# Patient Record
Sex: Female | Born: 1940 | Race: White | Hispanic: No | Smoking: Never smoker
Health system: Southern US, Community
[De-identification: ages and names within clinical notes are randomized; demographics above are authoritative.]

## PROBLEM LIST (undated history)

## (undated) DIAGNOSIS — N184 Chronic kidney disease, stage 4 (severe): Secondary | ICD-10-CM

## (undated) DIAGNOSIS — R7303 Prediabetes: Secondary | ICD-10-CM

## (undated) DIAGNOSIS — I10 Essential (primary) hypertension: Secondary | ICD-10-CM

## (undated) DIAGNOSIS — M1990 Unspecified osteoarthritis, unspecified site: Secondary | ICD-10-CM

## (undated) DIAGNOSIS — D649 Anemia, unspecified: Secondary | ICD-10-CM

## (undated) DIAGNOSIS — N289 Disorder of kidney and ureter, unspecified: Secondary | ICD-10-CM

## (undated) HISTORY — PX: PARATHYROIDECTOMY: SHX19

## (undated) HISTORY — PX: DILATION AND CURETTAGE OF UTERUS: SHX78

---

## 2003-06-17 ENCOUNTER — Other Ambulatory Visit: Payer: Self-pay

## 2003-07-03 HISTORY — PX: REPLACEMENT TOTAL KNEE: SUR1224

## 2004-08-19 ENCOUNTER — Ambulatory Visit: Payer: Self-pay | Admitting: Family Medicine

## 2007-07-19 ENCOUNTER — Ambulatory Visit: Payer: Self-pay

## 2008-04-15 ENCOUNTER — Ambulatory Visit: Payer: Self-pay | Admitting: Nephrology

## 2008-04-15 IMAGING — CR DG CHEST 2V
1 series · 3 of 3 positions shown · non-contrast
Comparison: none

REASON FOR EXAM: Proteimina, lymphadenopathy
COMMENTS:

[Series 1: view not recorded · 0.17mm/px · 3 of 3 slices shown]
[im 1/3]
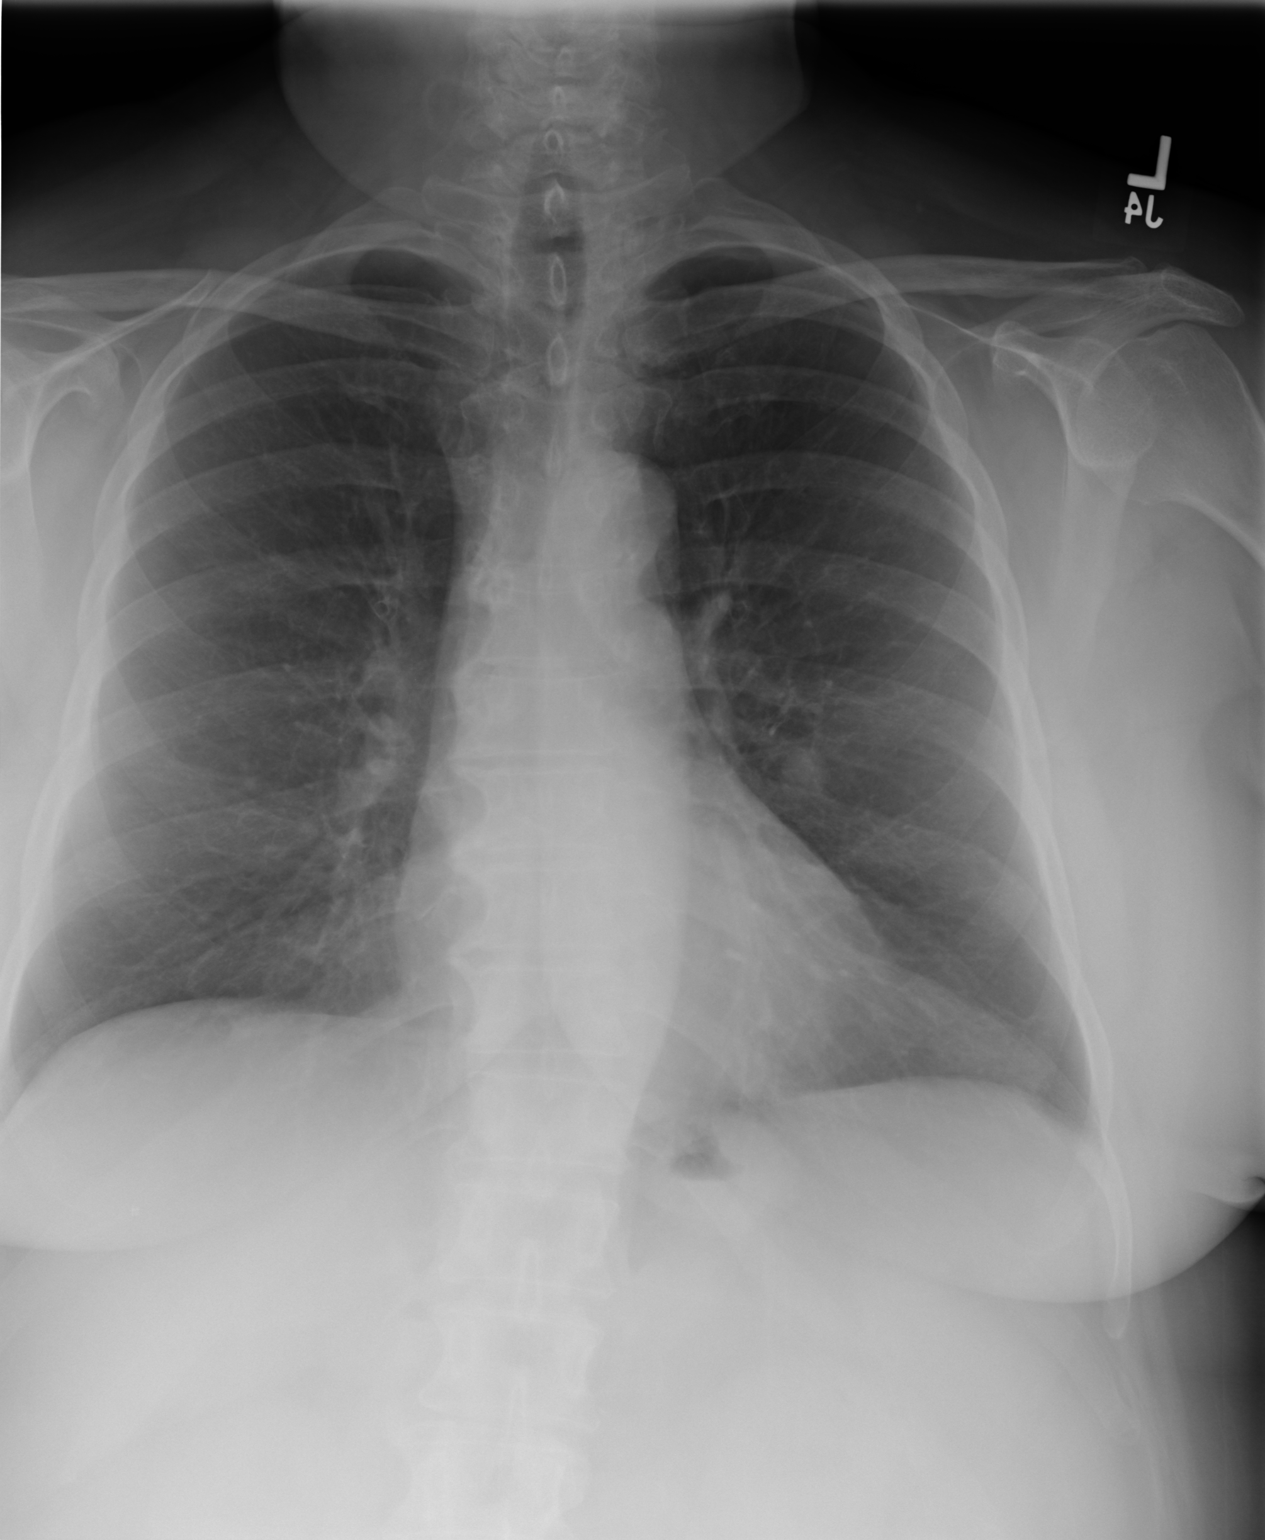
[im 2/3]
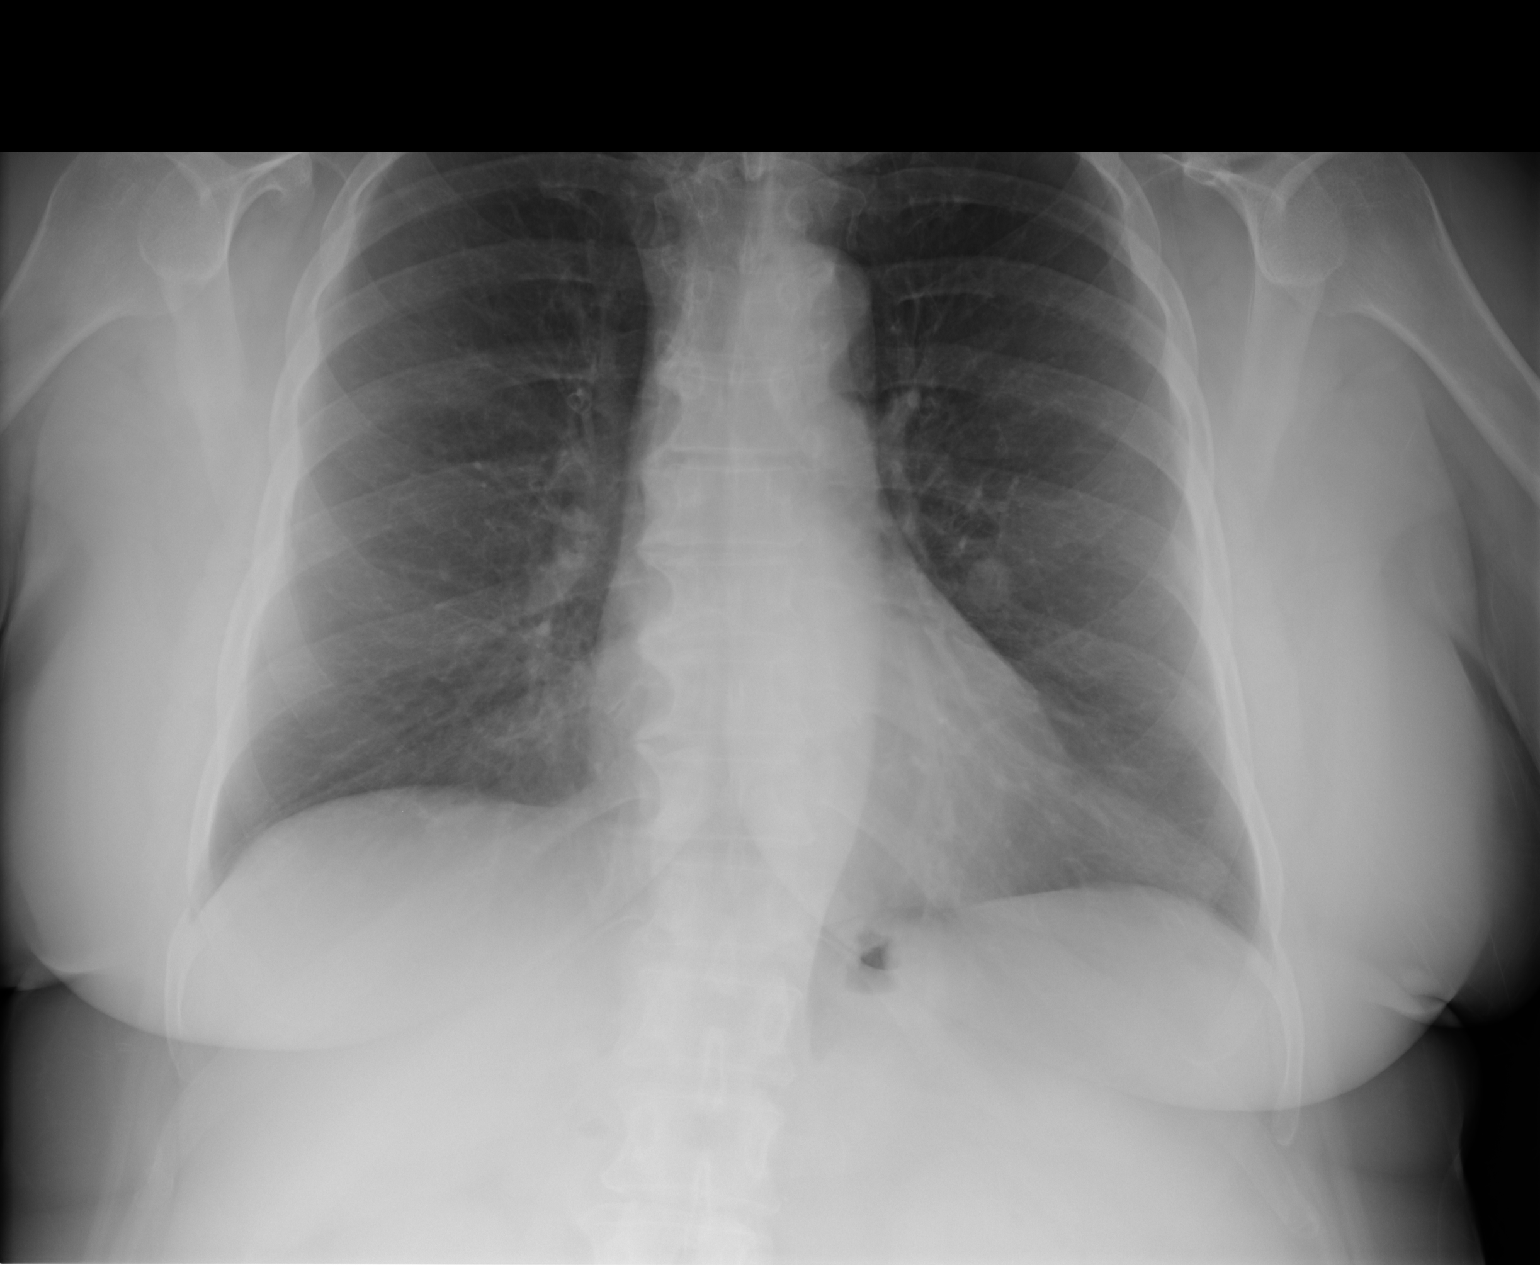
[im 3/3]
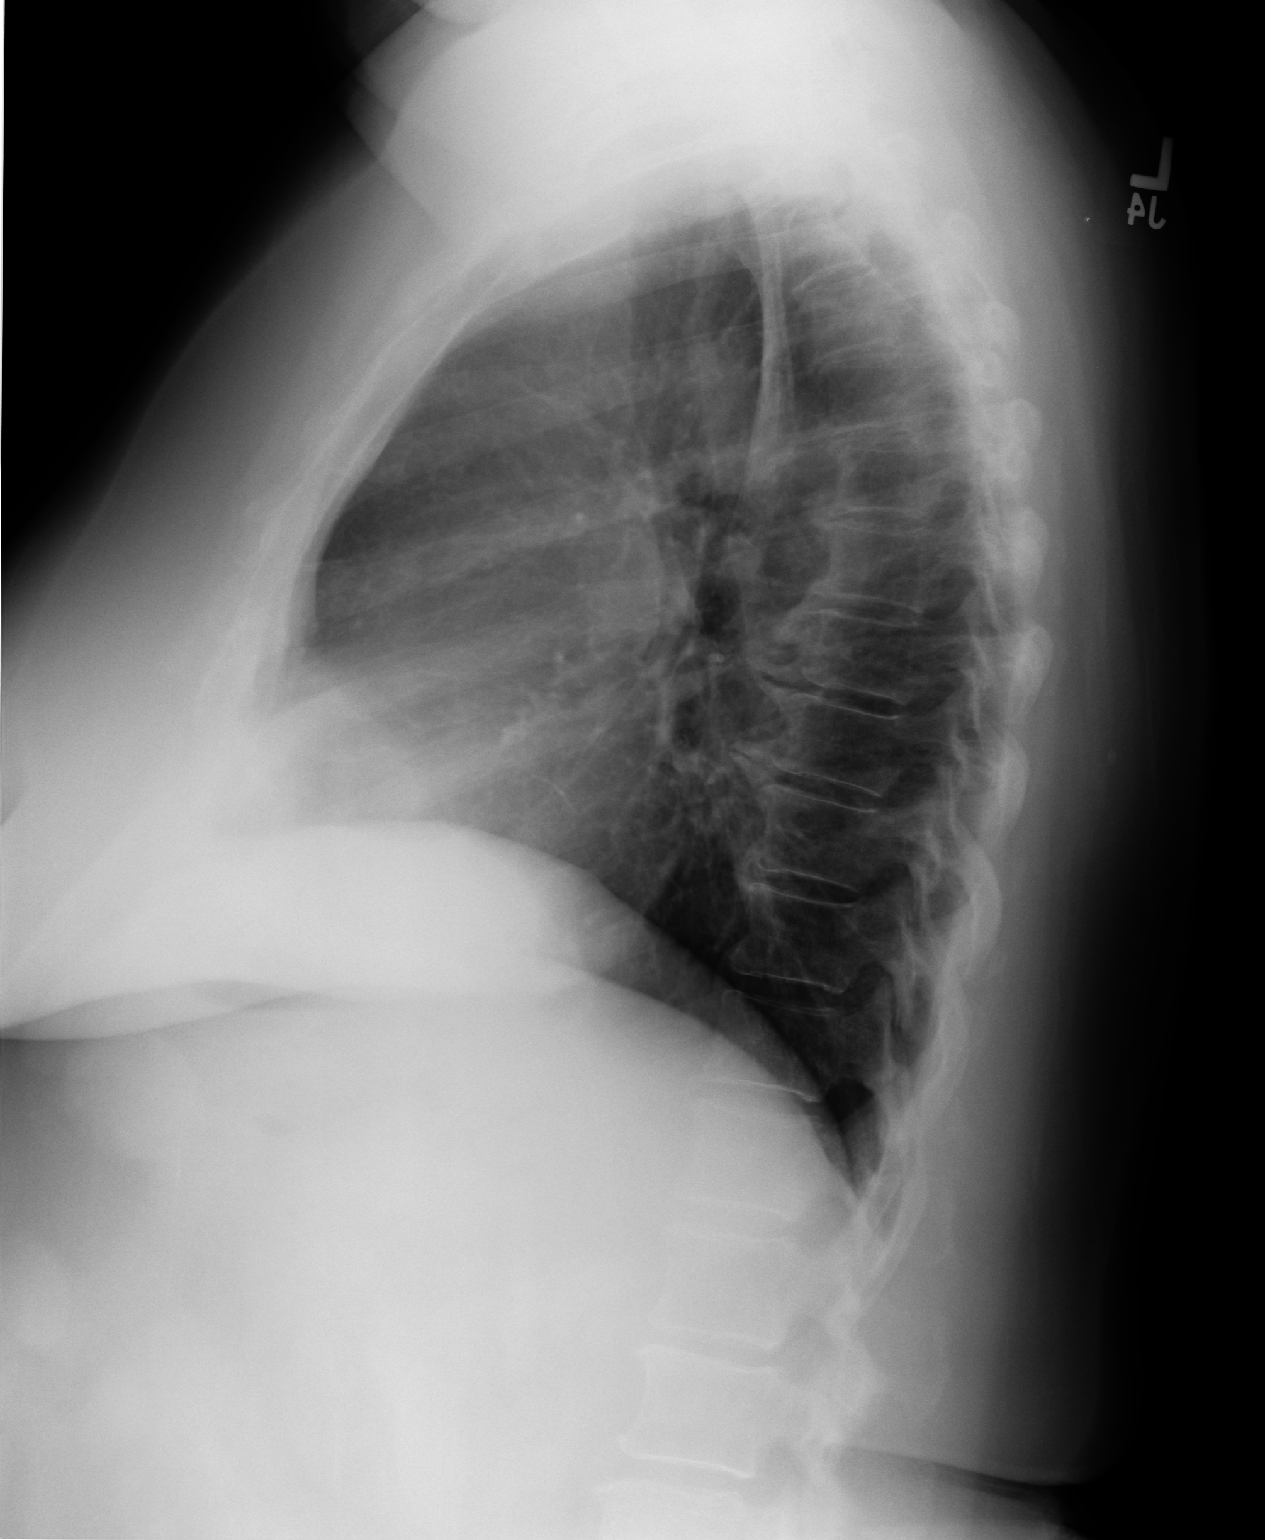

[3 of 3 positions shown; findings below may reference images not displayed]

PROCEDURE:     DXR - DXR CHEST PA (OR AP) AND LATERAL  - [DATE] [DATE]

RESULT:     There are no prior radiographs available for comparison. The
current exam shows a faint nodular density projected over the fourth LEFT
anterior rib. This is seen only in the PA view. Chest CT is recommended for
further evaluation. The lung fields otherwise are clear. Heart size is
normal. There is degenerative spurring at multiple levels of the thoracic
spine.
IMPRESSION: 1. Possible pulmonary nodule on the LEFT. Chest CT is recommended for
further evaluation.
2. The lung fields otherwise are clear.
3. Heart size is normal.

## 2008-04-16 ENCOUNTER — Ambulatory Visit: Payer: Self-pay | Admitting: Nephrology

## 2008-04-16 IMAGING — CT CT CHEST W/O CM
1 series · 15 of 33 positions shown, 19 images · non-contrast
Comparison: none

REASON FOR EXAM: PULM NODULES  CKD
COMMENTS:

[Series 2: soft tissue · axial · 0.69mm/px · z∈[+352,+618]mm · 15 of 63 slices shown, 19 images]
[im 5/63  mediastinal]
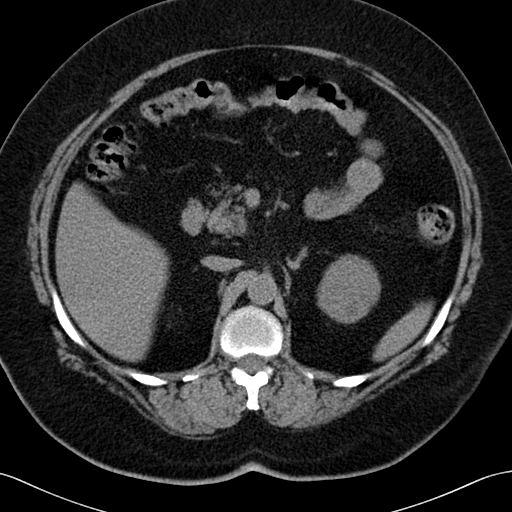
[im 5/63  lung]
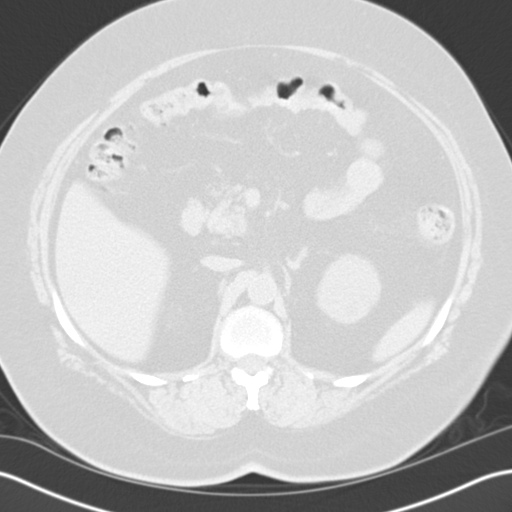
[im 10/63  lung]
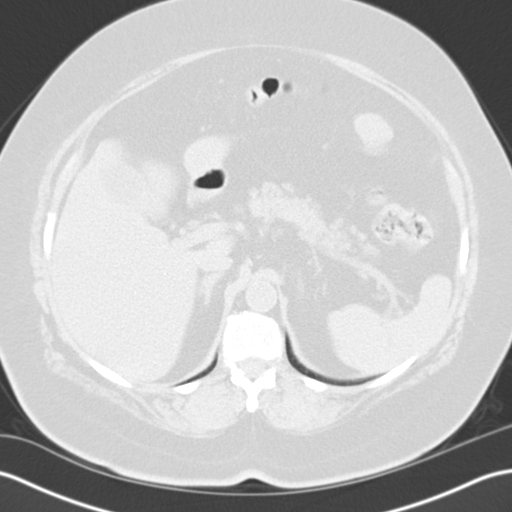
[im 13/63  lung]
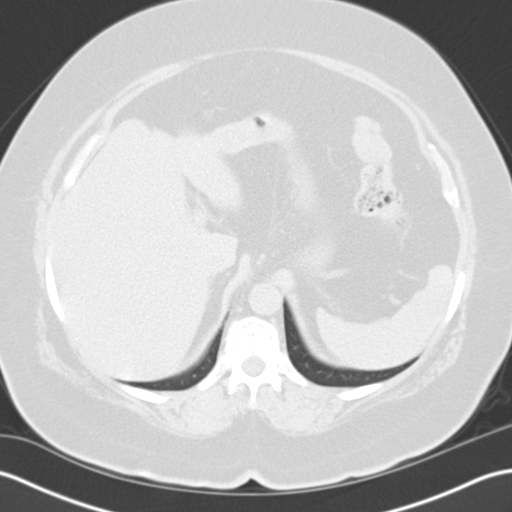
[im 17/63  lung]
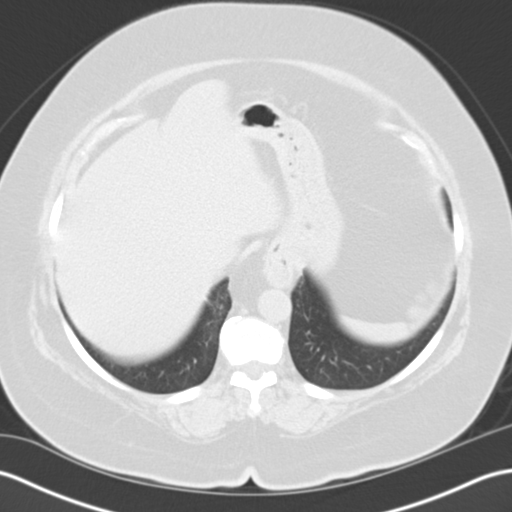
[im 21/63  mediastinal]
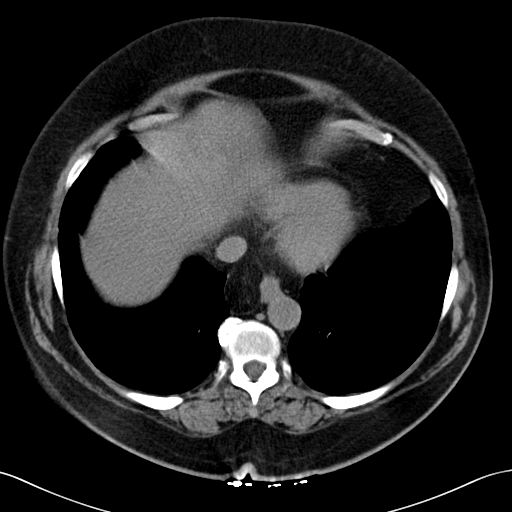
[im 21/63  lung]
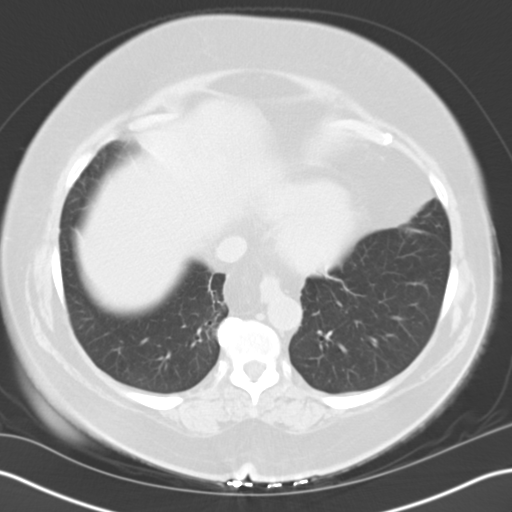
[im 25/63  lung]
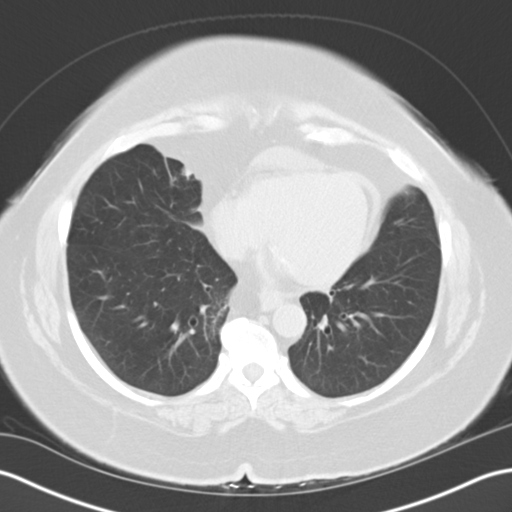
[im 28/63  lung]
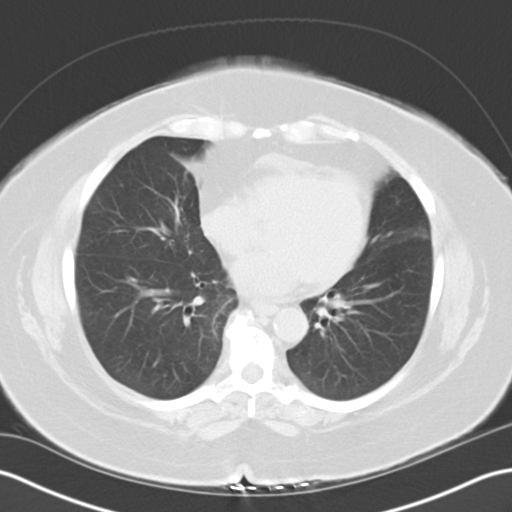
[im 33/63  lung]
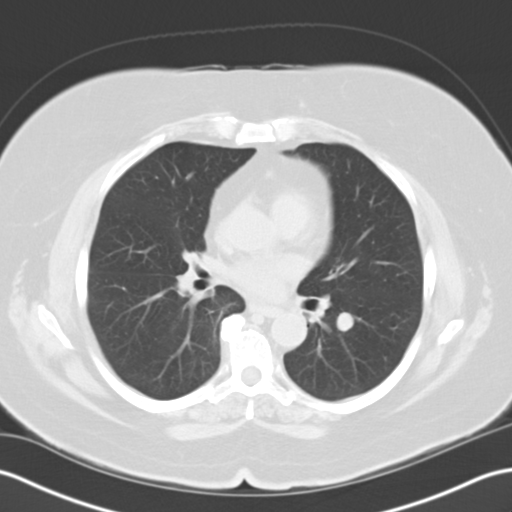
[im 35/63  mediastinal]
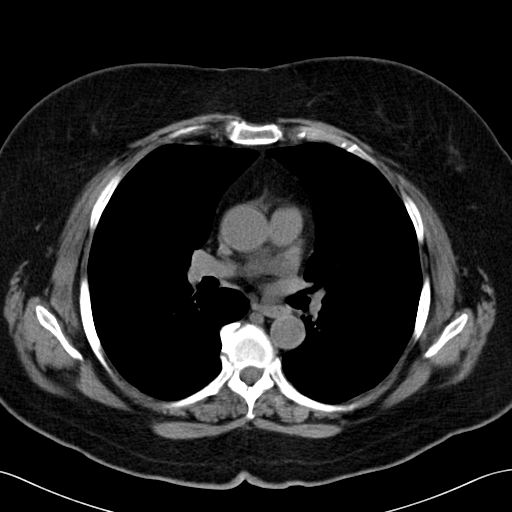
[im 35/63  lung]
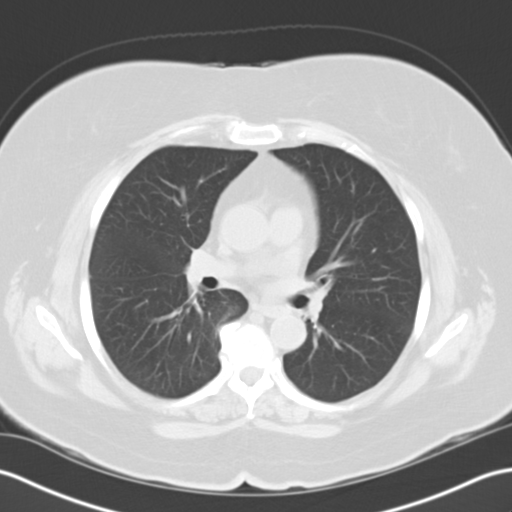
[im 38/63  lung]
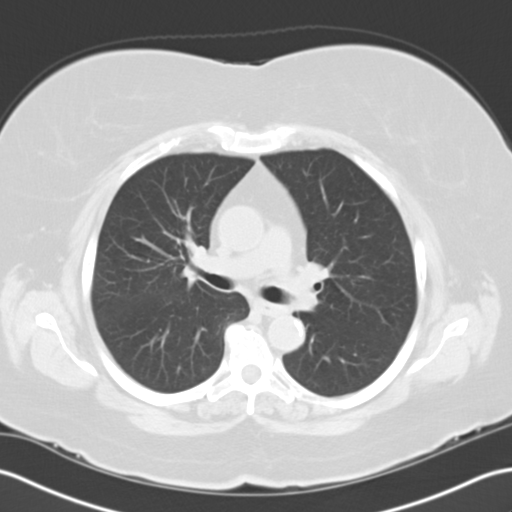
[im 42/63  lung]
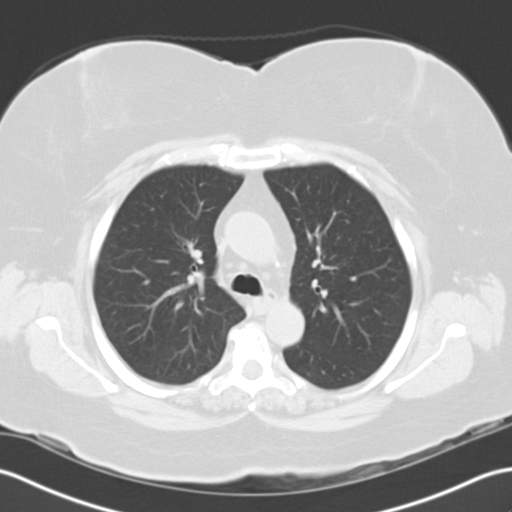
[im 46/63  lung]
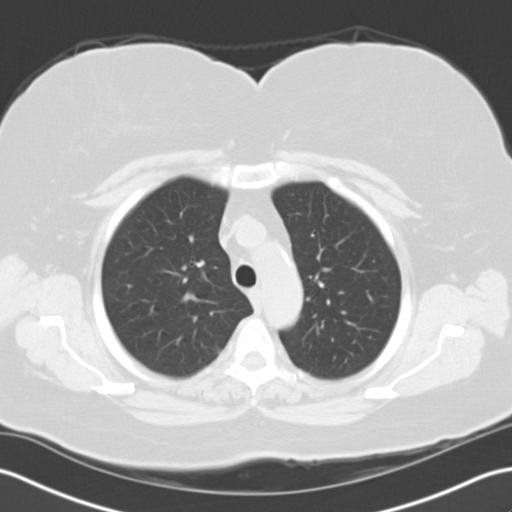
[im 50/63  mediastinal]
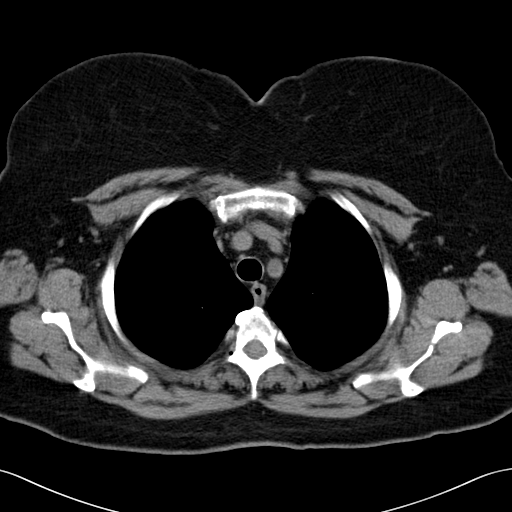
[im 50/63  lung]
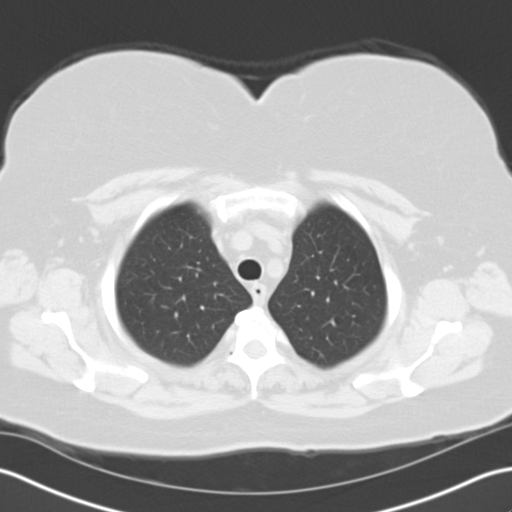
[im 53/63  lung]
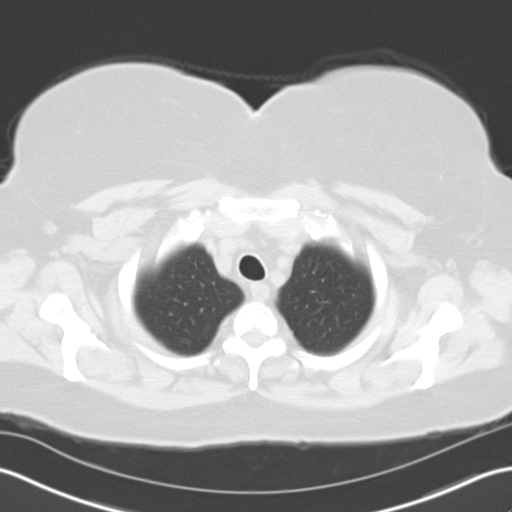
[im 58/63  lung]
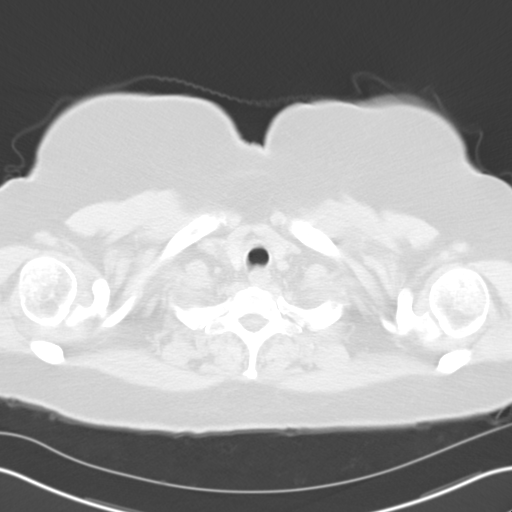

[15 of 33 positions shown; findings below may reference images not displayed]

PROCEDURE:     CT  - CT CHEST WITHOUT CONTRAST  - [DATE]  [DATE]

RESULT:     Noncontrast CT of the chest is compared to the previous chest
x-ray of [DATE].

There is a smoothly rounded noncalcified soft tissue nodule in the LEFT
lower lobe measuring 8.6 mm transversely and approximately 10 mm anterior to
posterior. A very subtle nodular density is seen at lung window settings on
image 46 posteriorly in the LEFT lower lobe approximately 7 mm deep to the
pleura. Long axis measurement of this is approximately 3 mm. There is
minimal linear atelectasis or fibrosis in the lung bases. No additional
pulmonary nodules are evident. There is no edema. There is no pleural
effusion. There is minimal pericardial thickening or pericardial effusion
along the anterior pericardial region showing an anterior to posterior
dimension measuring up to 1.4 cm in thickness on image 39. There is no
evidence of cardiomegaly. There is a small sliding-type hiatal hernia. The
adrenal glands are unremarkable. There appears to be a LEFT renal cyst
present. The other upper abdominal viscera included are unremarkable. No
definite mediastinal, hilar mass or adenopathy is present.
IMPRESSION: 1. Smoothly rounded nodular density in the LEFT lower lobe. This may
represent a noncalcified granuloma. Malignancy is not completely excluded.
PET/CT could be considered but granulomas can give positive findings on PET.
Short term surveillance CT could be performed in approximately 3 months to
document interval change in size. An old chest x-ray from [D9] is being
obtained to compare to the most recent exam. There is a 3 mm nodular density
in the LEFT lower lobe which is nonspecific and also can be followed on
subsequent exams. There is no mediastinal, hilar mass or adenopathy.
2. Possible small amount of pericardial effusion or pericardial thickening
as described.
3. Small hiatal hernia.

## 2008-07-17 ENCOUNTER — Ambulatory Visit: Payer: Self-pay | Admitting: Nephrology

## 2008-07-17 IMAGING — CT CT CHEST W/O CM
1 of 2 series · 14 of 32 positions shown, 18 images · non-contrast
Comparison: none

REASON FOR EXAM: lung nodule interval change
COMMENTS:

PROCEDURE:     CT  - CT CHEST WITHOUT CONTRAST  - [DATE]  [DATE]
RESULT:     Comparison: [DATE]
INDICATION: Left pulmonary nodule
TECHNIQUE: Multiple axial images of the chest are obtained without
intravenous contrast.

[Series 3: lung windows · axial · 0.66mm/px · z∈[+729,+959]mm · 14 of 56 slices shown, 18 images]
[im 5/56  mediastinal]
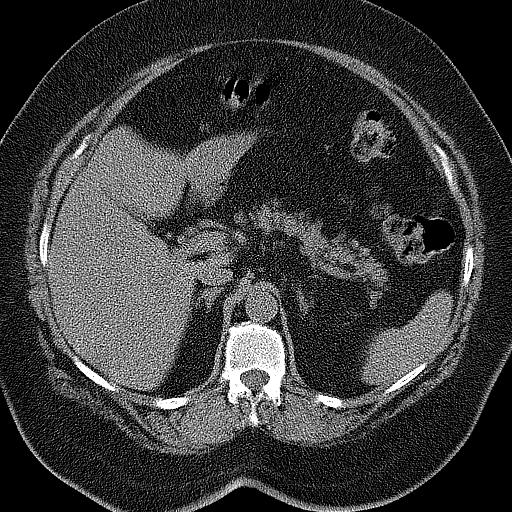
[im 5/56  lung]
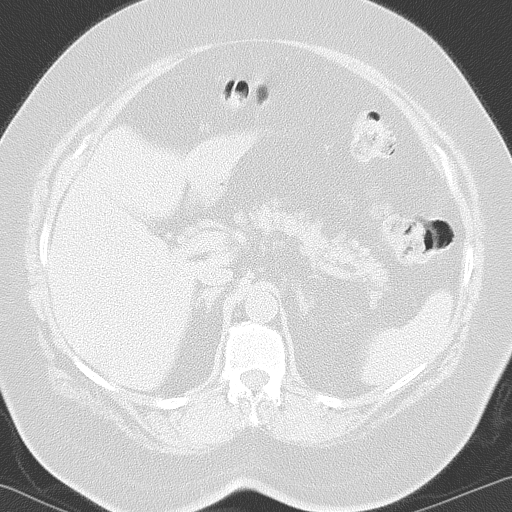
[im 9/56  lung]
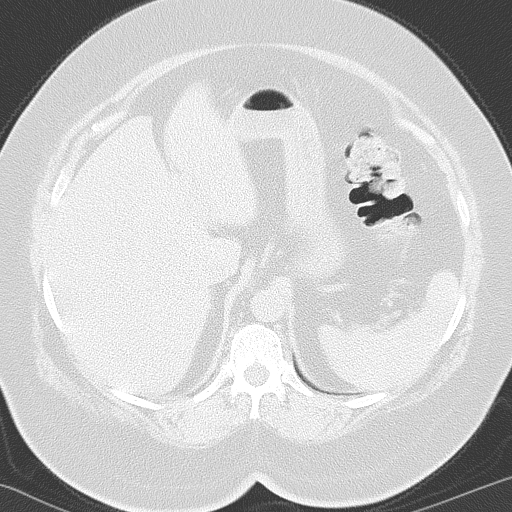
[im 13/56  lung]
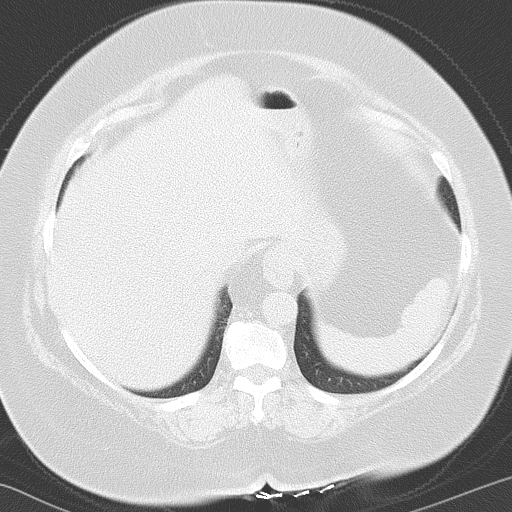
[im 17/56  lung]
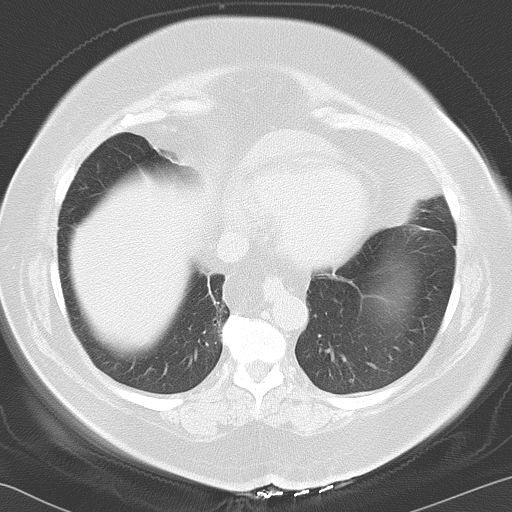
[im 22/56  mediastinal]
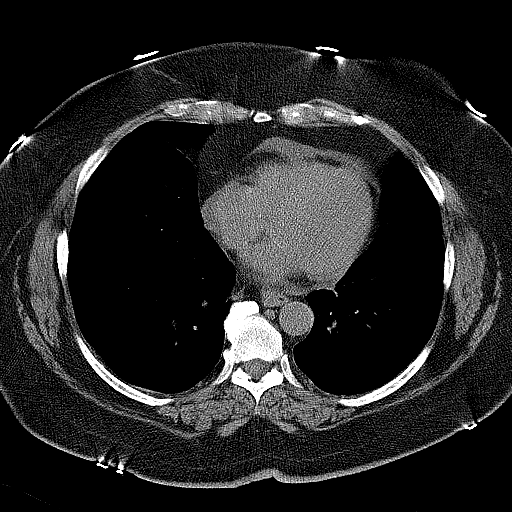
[im 22/56  lung]
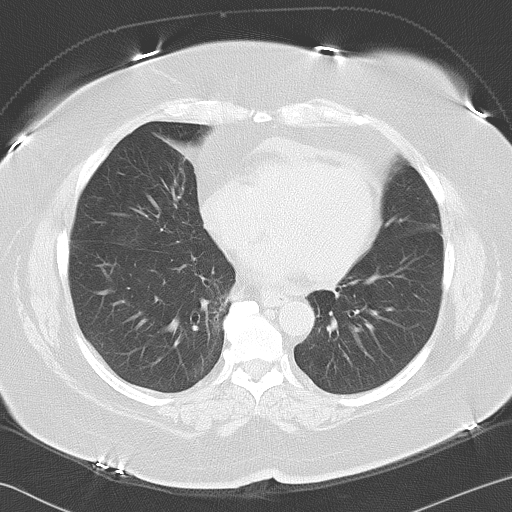
[im 25/56  lung]
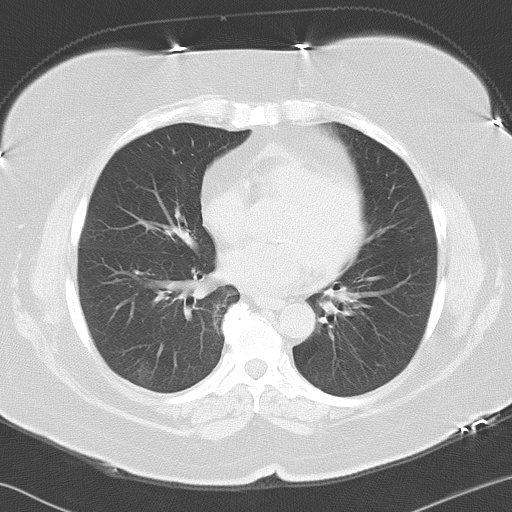
[im 26/56  lung]
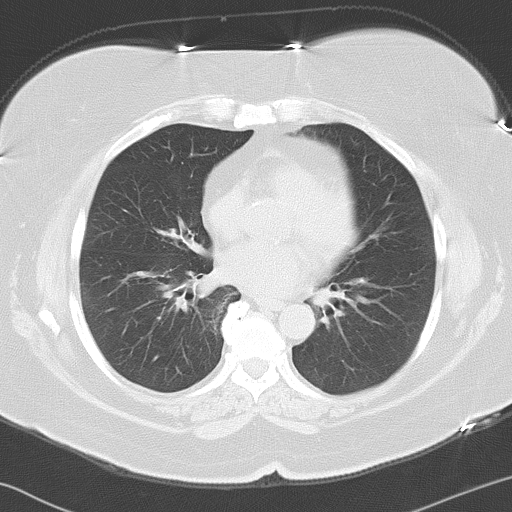
[im 28/56  lung]
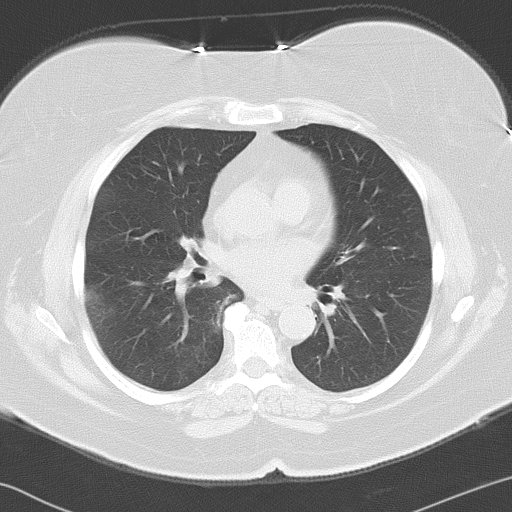
[im 30/56  mediastinal]
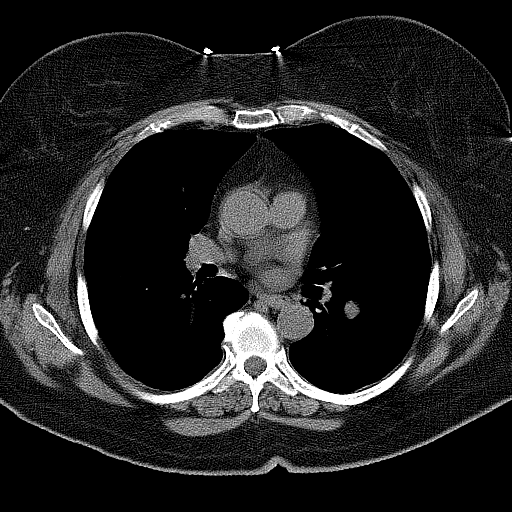
[im 30/56  lung]
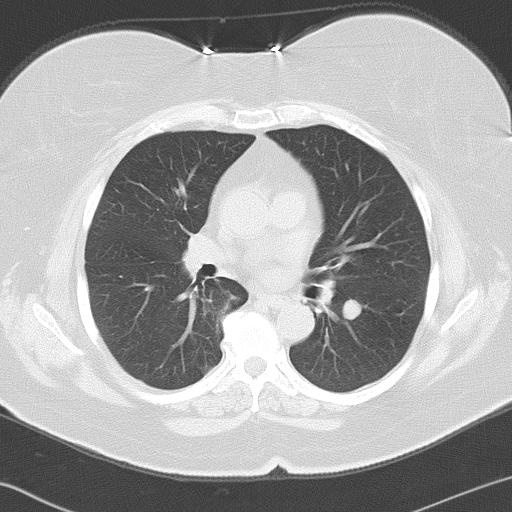
[im 34/56  lung]
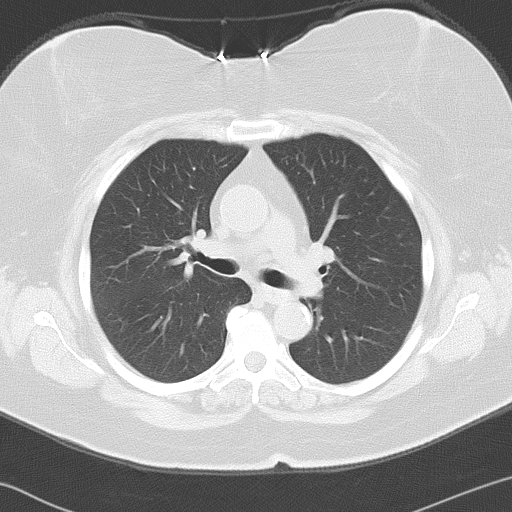
[im 39/56  lung]
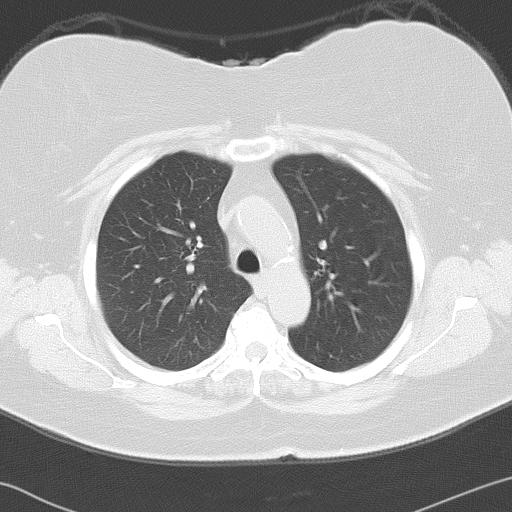
[im 43/56  lung]
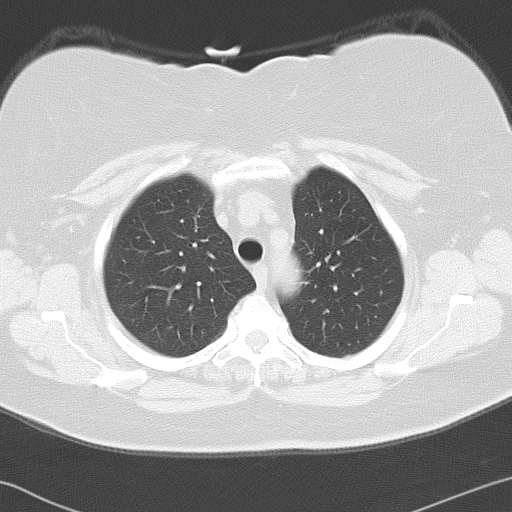
[im 47/56  mediastinal]
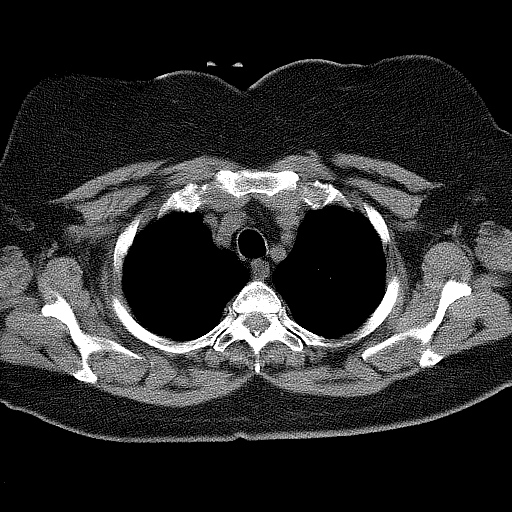
[im 47/56  lung]
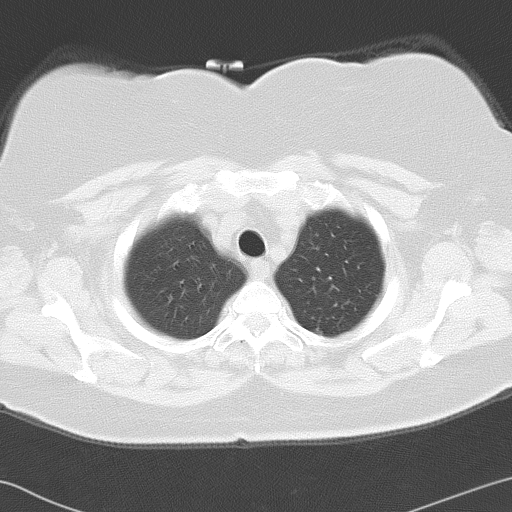
[im 51/56  lung]
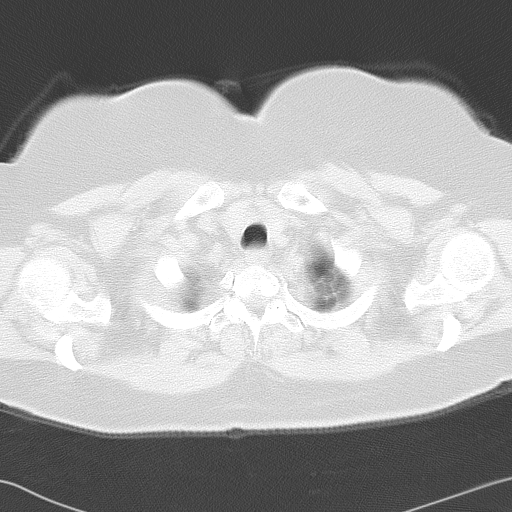

[14 of 32 positions shown; findings below may reference images not displayed]

FINDINGS: The central airways are patent. There is a well-circumscribed 14 mm
pulmonary nodule in the superior segment of the left lower lobe which is
unchanged compared with the prior examination. The lungs are otherwise
clear. There is no pleural effusion or pneumothorax.

There is no axillary or mediastinal lymphadenopathy. There is no gross hilar
lymphadenopathy although evaluation is slightly limited secondary to lack of
intravenous contrast.

The heart size is normal. There is a small pericardial effusion. The
thoracic aorta is normal in caliber.  .

Review of bone windows demonstrates no focal lytic or sclerotic lesions.

Limited noncontrast images of the upper abdomen were obtained. The adrenal
glands appear normal. The remainder of the visualized abdominal organs are
unremarkable.
IMPRESSION: Stable 14 mm pulmonary nodule in the superior segment of the left lower
lobe. Recommend followup CT of the chest without contrast in 3 months versus
a PET/CT.

## 2008-10-16 ENCOUNTER — Ambulatory Visit: Payer: Self-pay | Admitting: Nephrology

## 2008-10-16 IMAGING — CT NM PET TUM IMG LTD AREA
5 series · 24 of 25 positions shown · non-contrast
Comparison: none

REASON FOR EXAM: 3mo FU lung nodule
COMMENTS:

[Series 3: ct wb 3.0 b30f · axial · 3.0mm · 0.98mm/px · z∈[-1390,-522]mm · 10 of 435 slices shown]
[im 1/435  soft-tissue]
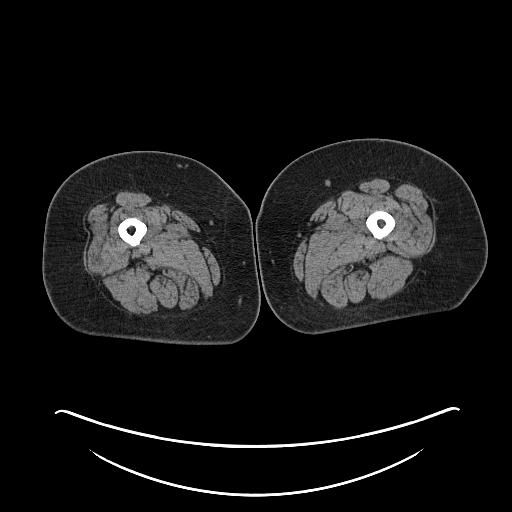
[im 49/435  soft-tissue]
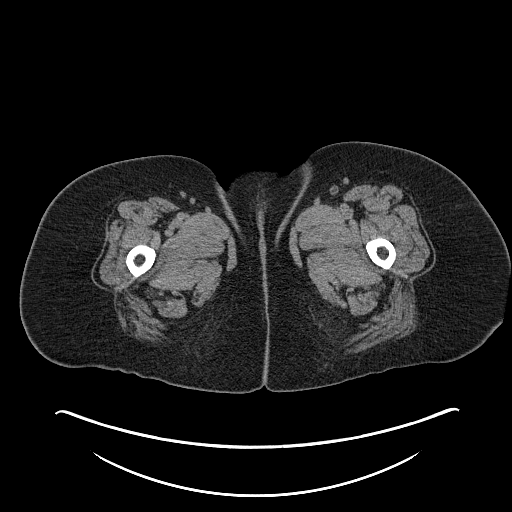
[im 97/435  soft-tissue]
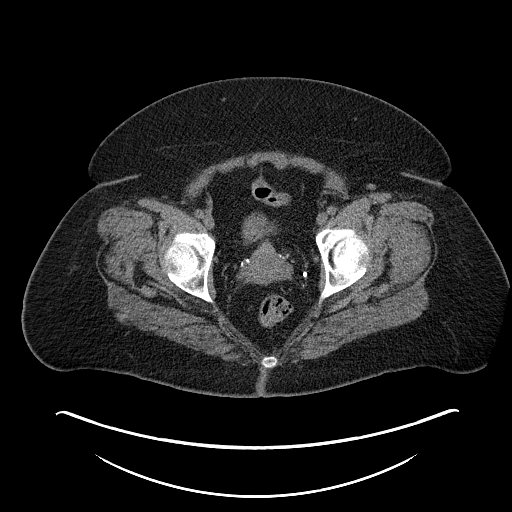
[im 145/435  soft-tissue]
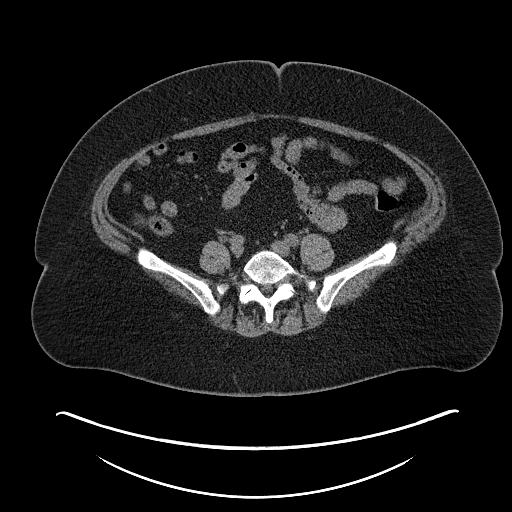
[im 193/435  soft-tissue]
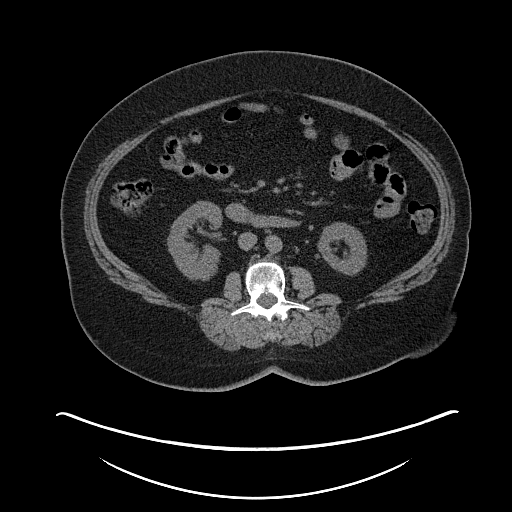
[im 242/435  soft-tissue]
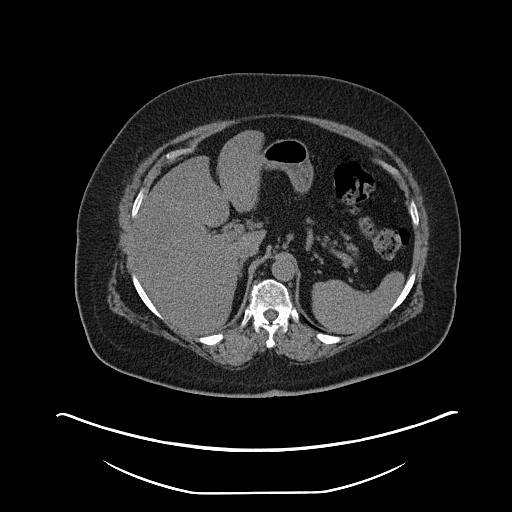
[im 290/435  soft-tissue]
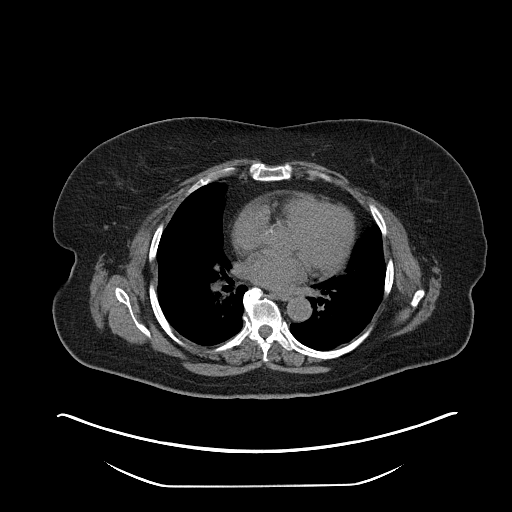
[im 338/435  soft-tissue]
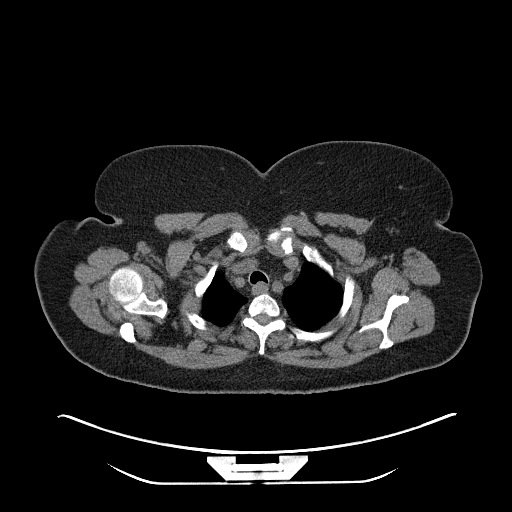
[im 386/435  soft-tissue]
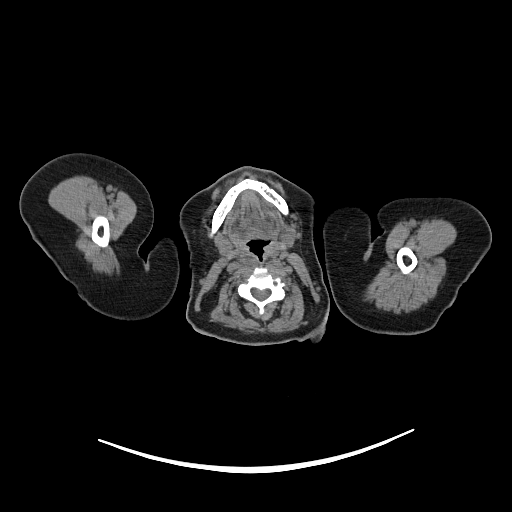
[im 435/435  soft-tissue]
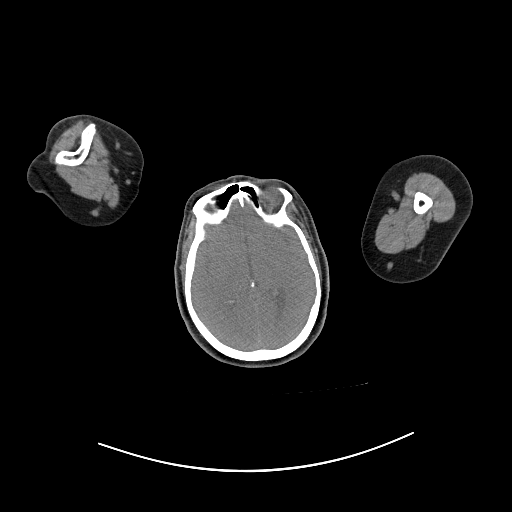

[Series 102: pet wb · axial · 5.0mm · 4.07mm/px · z∈[-1388,-522]mm · 6 of 290 slices shown]
[im 1/290]
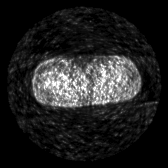
[im 49/290]
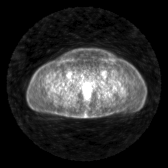
[im 145/290]
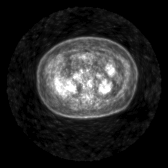
[im 193/290]
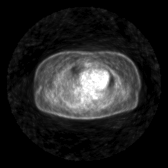
[im 241/290]
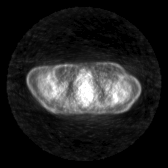
[im 290/290]
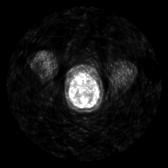

[Series 603: pet axial · 4 of 170 slices shown]
[im 1/170]
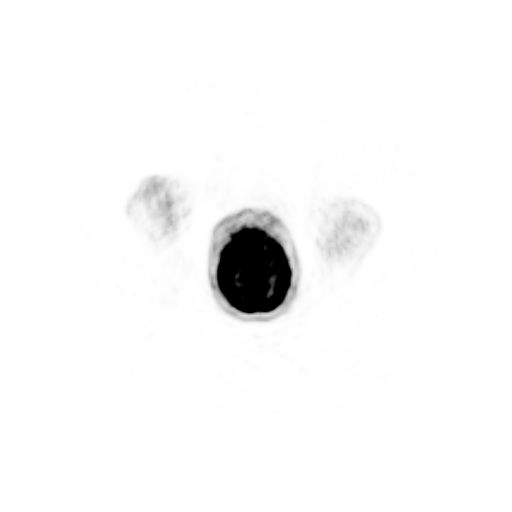
[im 57/170]
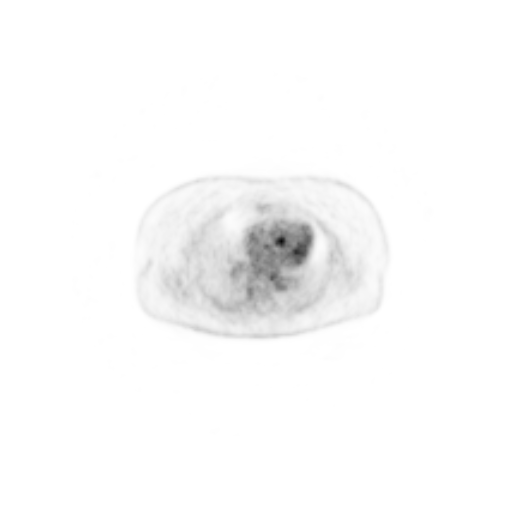
[im 113/170]
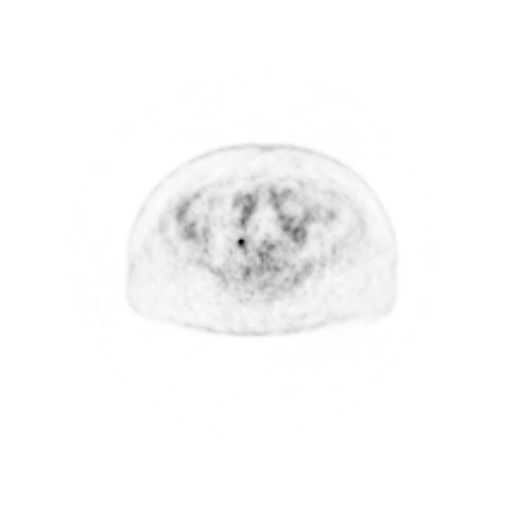
[im 170/170]
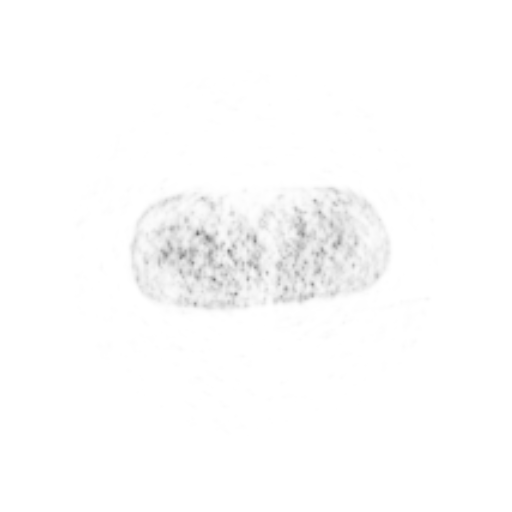

[Series 604: pet coronal · 2 of 69 slices shown]
[im 1/69]
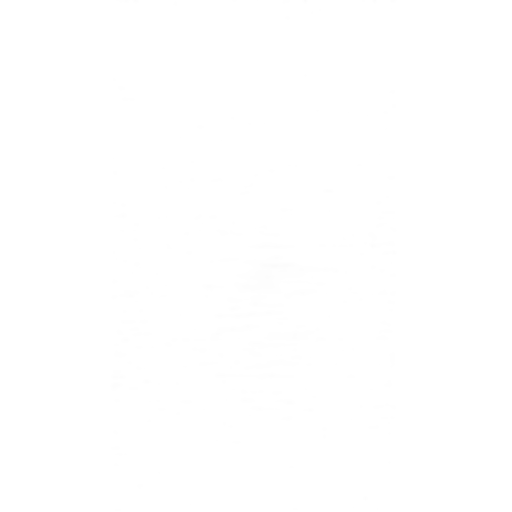
[im 69/69]
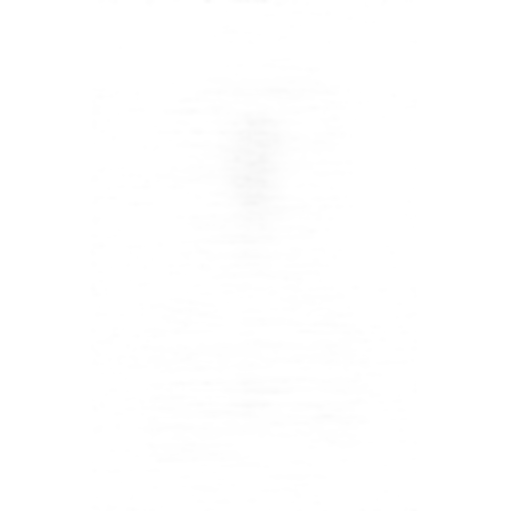

[Series 605: pet sagittal · 2 of 100 slices shown]
[im 1/100]
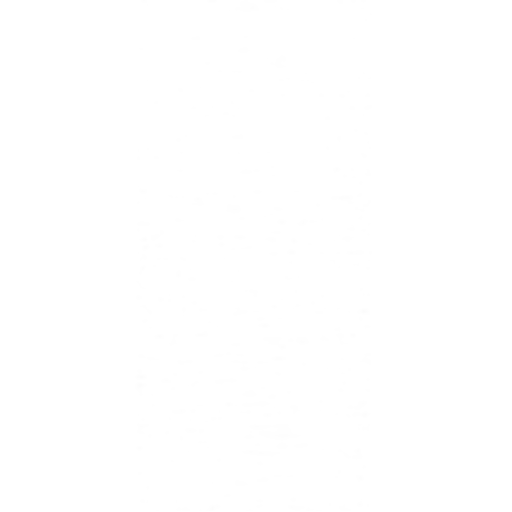
[im 100/100]
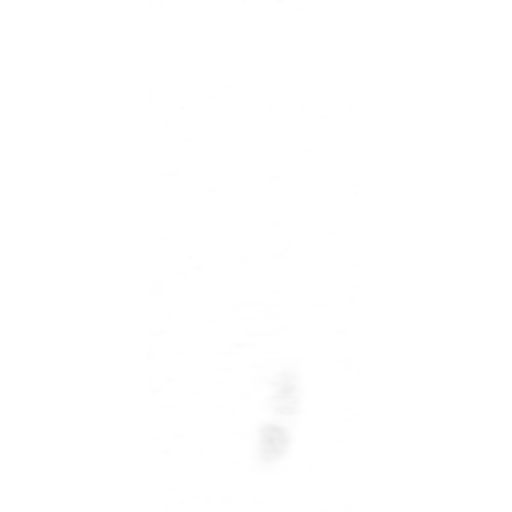

[24 of 25 positions shown; findings below may reference images not displayed]

PROCEDURE:     PET - PET/CT DX LUNG CA  - [DATE]  [DATE]

RESULT:     The patient is undergoing evaluation of an abnormal pulmonary
nodules seen on prior CT. The patient's fasting blood glucose level was 115
mg/dL. The patient received 13.63 mCi of fluorine 18 labeled
fluorodeoxyglucose at [DATE] a.m. the is the left antecubital vein. Scanning
was begun at [DATE] p.m. Delayed imaging was performed as well. A noncontrast
CT scan was performed from the base of the brain to the upper thighs for
coregistration and attenuation correction. Comparison is made to CT scan of
the chest dated a [DATE].

On today's study I do not see more than minimal uptake in the nodule in the
superior segment of the left lower lobe. The SUV value on the initial images
is only 2.1. This falls to less than one on the delayed images. I do not see
abnormal uptake elsewhere within the lungs. There is mildly increased uptake
associated with the lower esophagus but a discrete mass within the esophagus
is not seen and there is a small hiatal hernia. Within the abdomen there is
normal expected renal and bladder uptake and small amounts of bowel uptake.
I do not see findings suspicious for intra-abdominal lymphadenopathy.

I do not see abnormality within the neck. Incidental note is made of the
presence of a gallstone.
IMPRESSION: 1. I do not see significant abnormal signal in the left lower lobe nodule. I
do not see other areas of abnormal uptake within the thorax. There is mildly
increased uptake within the distal esophagus adjacent to the small hiatal
hernia that is likely physiologic.
2. In the abdomen and pelvis I do not see abnormal uptake of the
radiopharmaceutical.
3. There is a gallstone present.

It would be reasonable to consider an additional 4-6 month followup CT scan
to allow reassessment of the size of the pulmonary nodule. This would bring
the patient to approximately one year since the nodule was identified.
Consideration could be given then for performing longer term followup to
assure stability [DATE] years.

## 2008-10-16 IMAGING — CT NM PET TUM IMG LTD AREA
1 of 5 series · 1 of 25 positions shown · non-contrast
Comparison: none

REASON FOR EXAM: 3mo FU lung nodule
COMMENTS:

[Series 102: pet lung · axial · 5.0mm · 4.07mm/px · 1 of 94 slices shown]
[im 47/94]
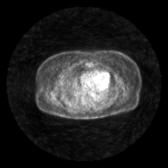

[1 of 25 positions shown; findings below may reference images not displayed]

PROCEDURE:     PET - PET/CT DX LUNG CA  - [DATE]  [DATE]

RESULT:     The patient is undergoing evaluation of an abnormal pulmonary
nodules seen on prior CT. The patient's fasting blood glucose level was 115
mg/dL. The patient received 13.63 mCi of fluorine 18 labeled
fluorodeoxyglucose at [DATE] a.m. the is the left antecubital vein. Scanning
was begun at [DATE] p.m. Delayed imaging was performed as well. A noncontrast
CT scan was performed from the base of the brain to the upper thighs for
coregistration and attenuation correction. Comparison is made to CT scan of
the chest dated a [DATE].

On today's study I do not see more than minimal uptake in the nodule in the
superior segment of the left lower lobe. The SUV value on the initial images
is only 2.1. This falls to less than one on the delayed images. I do not see
abnormal uptake elsewhere within the lungs. There is mildly increased uptake
associated with the lower esophagus but a discrete mass within the esophagus
is not seen and there is a small hiatal hernia. Within the abdomen there is
normal expected renal and bladder uptake and small amounts of bowel uptake.
I do not see findings suspicious for intra-abdominal lymphadenopathy.

I do not see abnormality within the neck. Incidental note is made of the
presence of a gallstone.
IMPRESSION: 1. I do not see significant abnormal signal in the left lower lobe nodule. I
do not see other areas of abnormal uptake within the thorax. There is mildly
increased uptake within the distal esophagus adjacent to the small hiatal
hernia that is likely physiologic.
2. In the abdomen and pelvis I do not see abnormal uptake of the
radiopharmaceutical.
3. There is a gallstone present.

It would be reasonable to consider an additional 4-6 month followup CT scan
to allow reassessment of the size of the pulmonary nodule. This would bring
the patient to approximately one year since the nodule was identified.
Consideration could be given then for performing longer term followup to
assure stability [DATE] years.

## 2009-04-21 ENCOUNTER — Ambulatory Visit: Payer: Self-pay | Admitting: Nephrology

## 2009-04-21 IMAGING — CT CT CHEST W/O CM
1 series · 15 of 33 positions shown, 19 images · non-contrast
Comparison: none

REASON FOR EXAM: lung nodule 6 month F U   renal failure
COMMENTS:

[Series 2: soft tissue · axial · 0.67mm/px · z∈[-1148,-908]mm · 15 of 58 slices shown, 19 images]
[im 5/58  mediastinal]
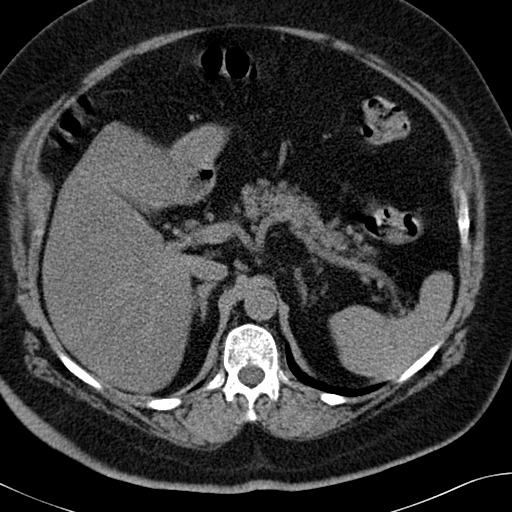
[im 5/58  lung]
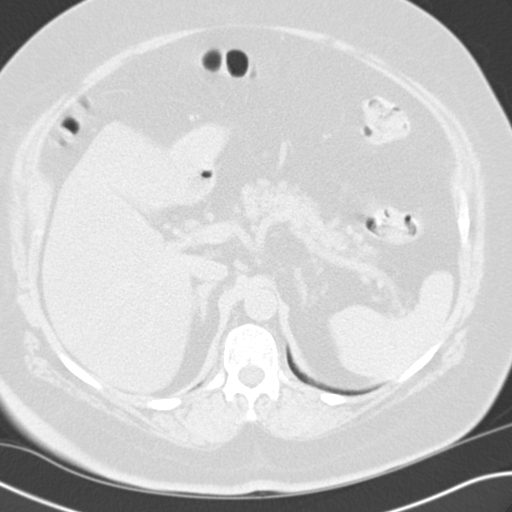
[im 9/58  lung]
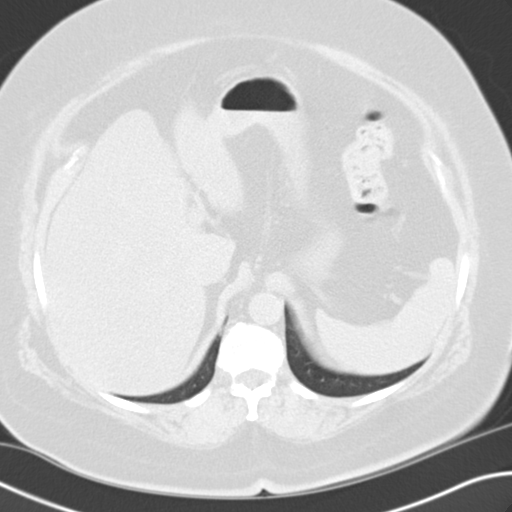
[im 12/58  lung]
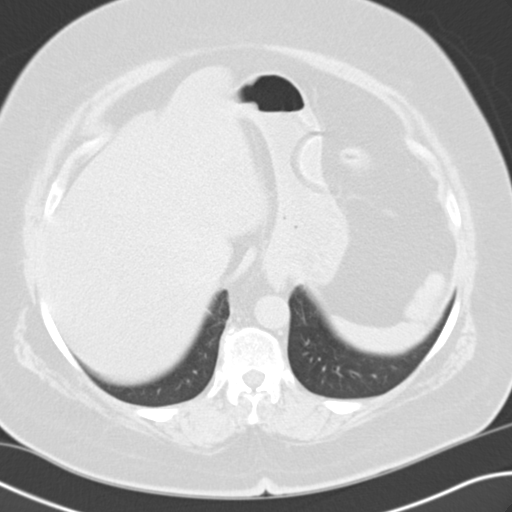
[im 15/58  lung]
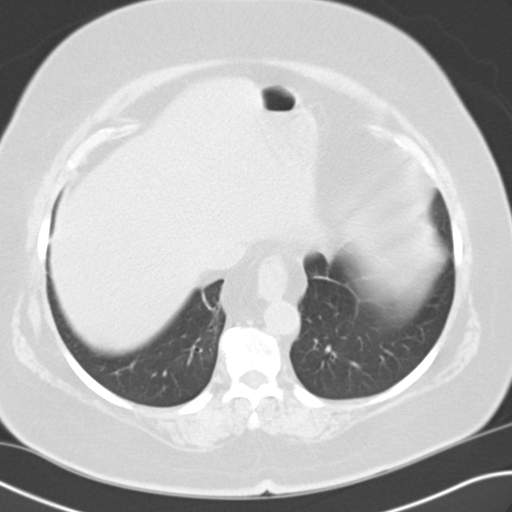
[im 20/58  mediastinal]
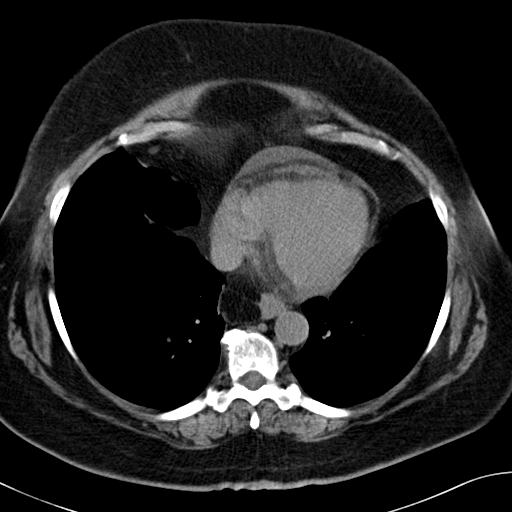
[im 20/58  lung]
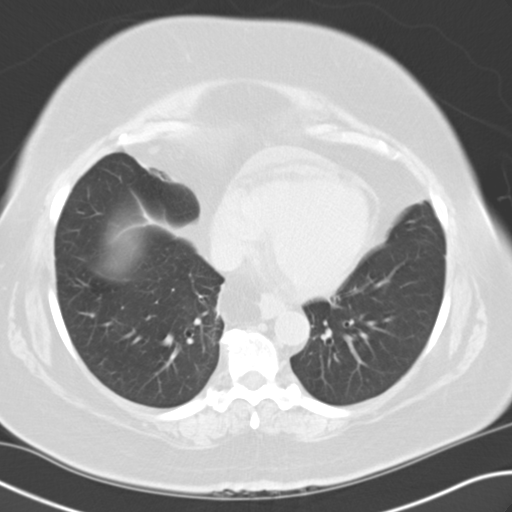
[im 23/58  lung]
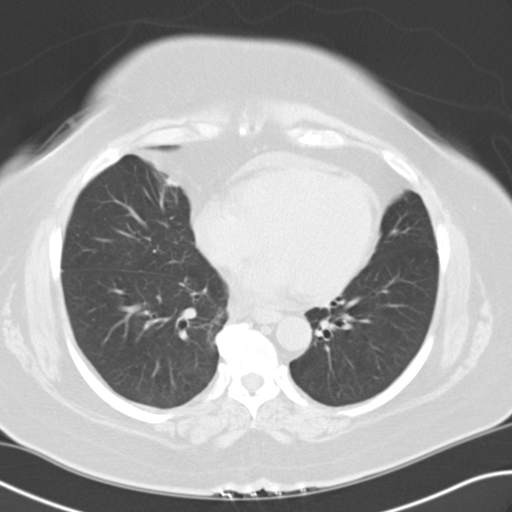
[im 26/58  lung]
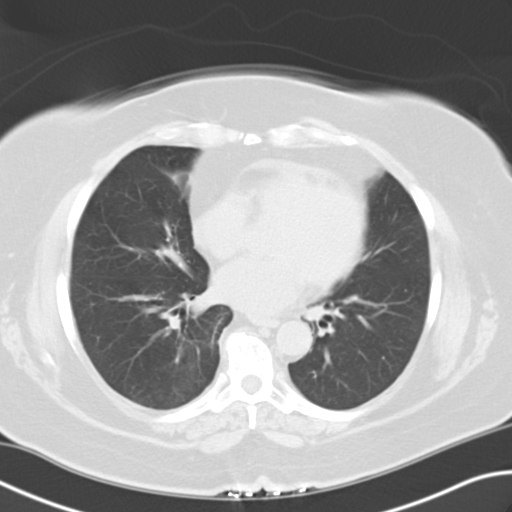
[im 30/58  lung]
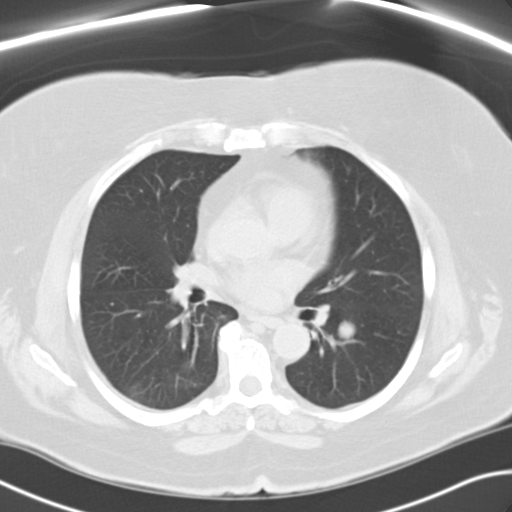
[im 32/58  mediastinal]
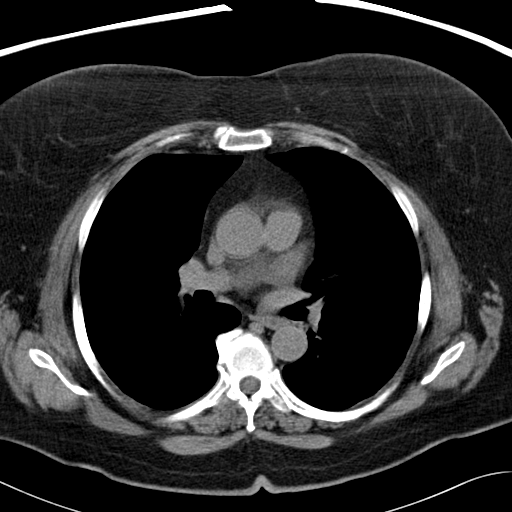
[im 32/58  lung]
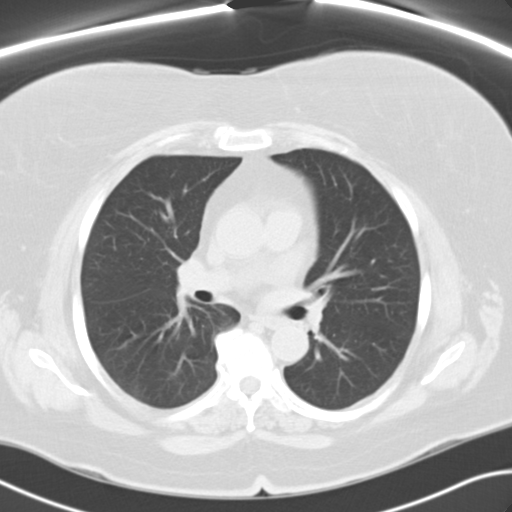
[im 35/58  lung]
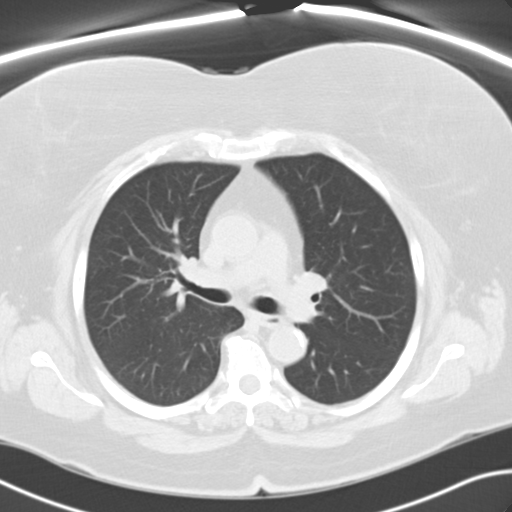
[im 39/58  lung]
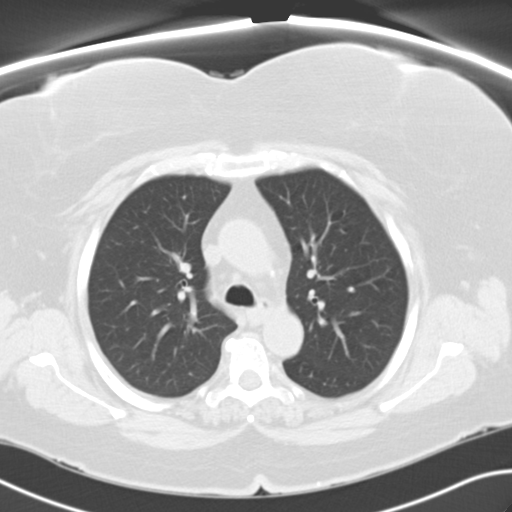
[im 43/58  lung]
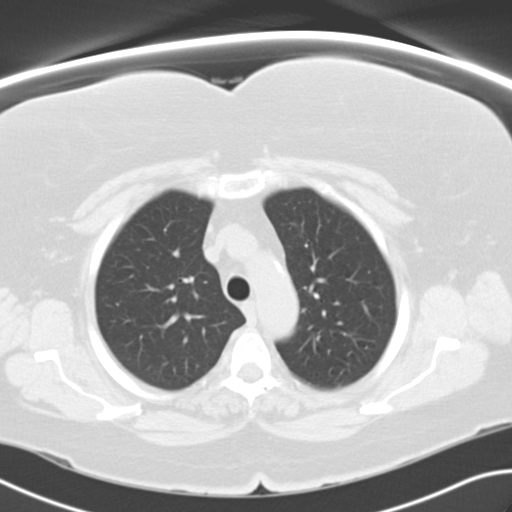
[im 46/58  mediastinal]
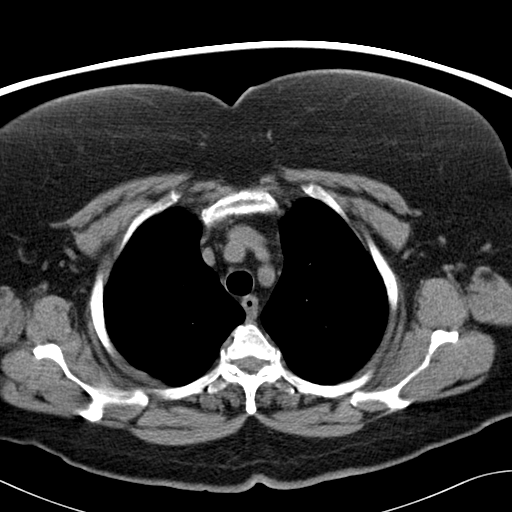
[im 46/58  lung]
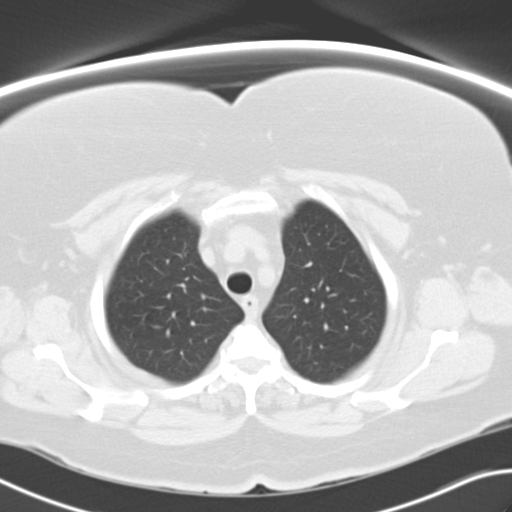
[im 49/58  lung]
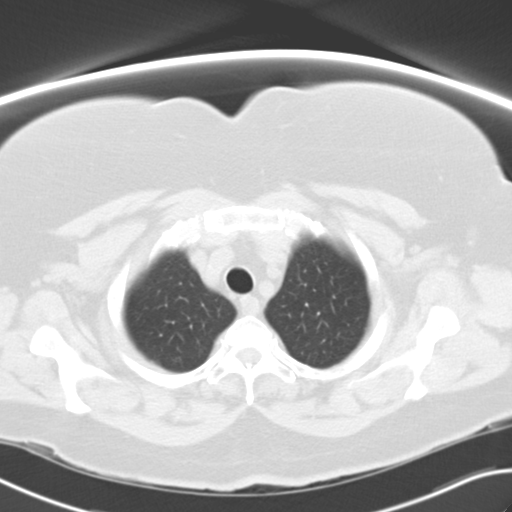
[im 53/58  lung]
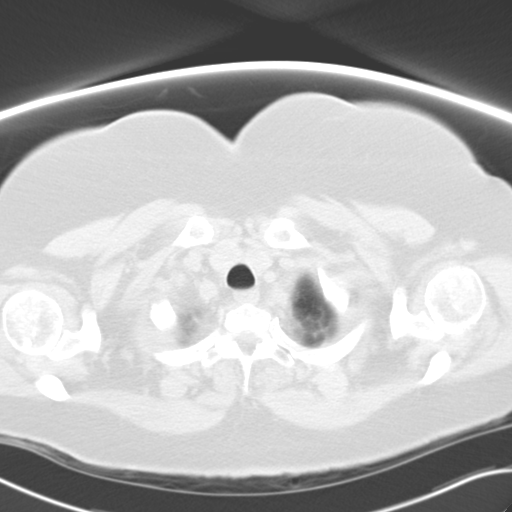

[15 of 33 positions shown; findings below may reference images not displayed]

PROCEDURE:     CT  - CT CHEST WITHOUT CONTRAST  - [DATE] [DATE]

RESULT:     Axial CT scanning was performed through the chest at 5 mm
intervals and slice thicknesses without administration of IV contrast
material. Comparison is made to study [DATE]. Review of multiplanar
reconstructed images was performed separately on the VIA monitor.

At lung window settings on image of 30 in the superior segment of the left
lower lobe there is a 1.3 by 1.2 centimeter diameter soft tissue nodule.
This appears unchanged. I do not see pulmonary nodules elsewhere. There is
no interstitial nor alveolar infiltrate. Is window settings there is a small
pericardial effusion which is decreased slightly in conspicuity since the
previous study. The cardiac chambers are normal in size. The caliber of the
thoracic aorta is normal. There is a small hiatal hernia. I see no pleural
effusion or pathologic sized mediastinal or hilar lymph nodes.

Within the upper abdomen the observed portions of the liver and spleen
exhibit no acute abnormality. I see no adrenal masses. There is a radiodense
structure floating in the gallbladder lumen which may reflect a tiny stone
on image
55. There is radiodensity more inferiorly positioned in the gallbladder
fundus which may reflect faintly radiodense bile or tiny stones as well.
IMPRESSION: 1. There is a stable appearance of the nodule in the superior segment of the
left lower lobe. I see no nodules or other pulmonary parenchymal abnormality
elsewhere.
2. There is a small pericardial effusion which is not new.
3. I cannot exclude the presence of gallstones versus inspissated bile.

## 2009-06-30 ENCOUNTER — Ambulatory Visit: Payer: Self-pay | Admitting: Internal Medicine

## 2009-10-23 ENCOUNTER — Ambulatory Visit: Payer: Self-pay | Admitting: Internal Medicine

## 2009-10-23 IMAGING — CT CT CHEST W/O CM
1 of 2 series · 14 of 32 positions shown, 18 images · non-contrast
Comparison: none

REASON FOR EXAM: KIDNEY FAILURE L LUNG NODULE
COMMENTS:

[Series 2: chest w/o 5.0 i41f · axial · non-contrast · 0.78mm/px · z∈[-634,-368]mm · 14 of 63 slices shown, 18 images]
[im 5/63  mediastinal]
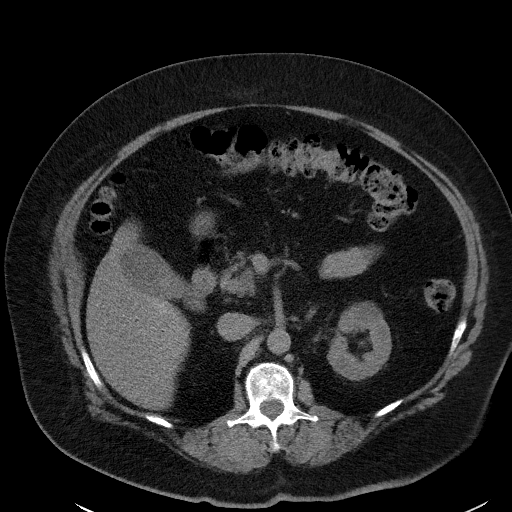
[im 5/63  lung]
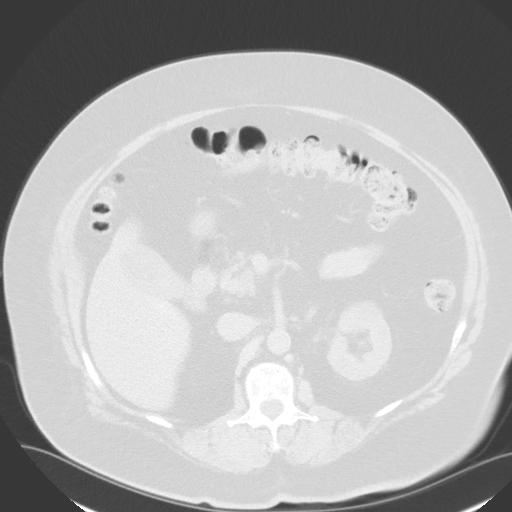
[im 10/63  lung]
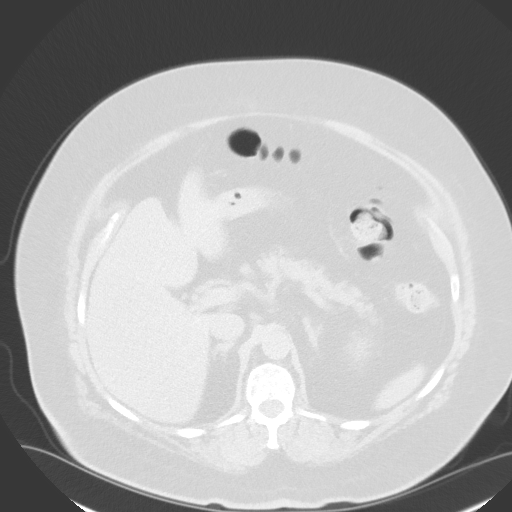
[im 15/63  lung]
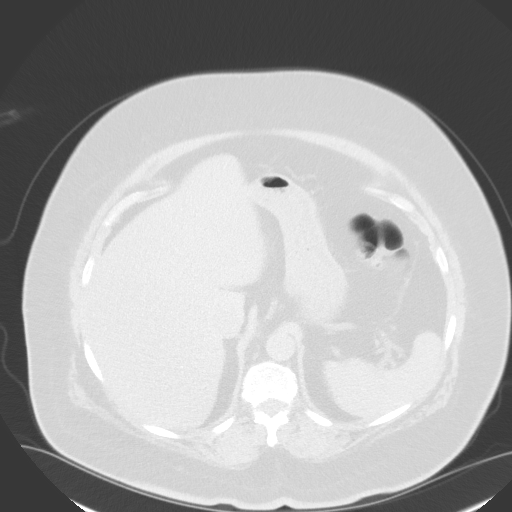
[im 20/63  lung]
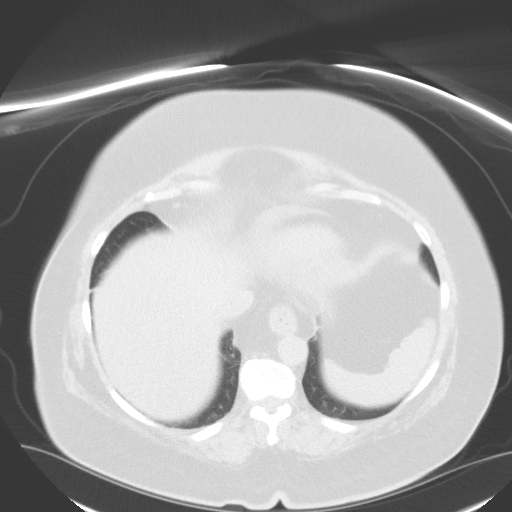
[im 24/63  mediastinal]
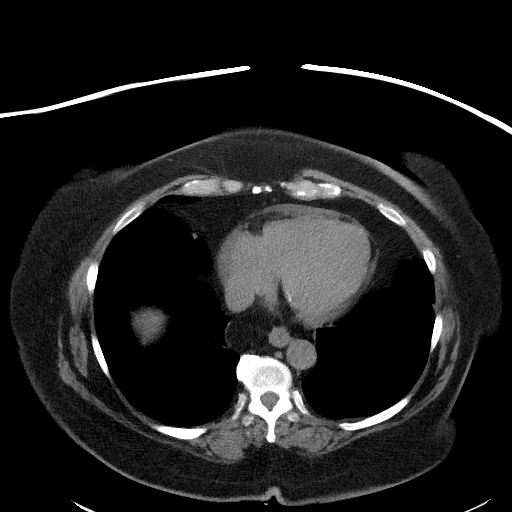
[im 24/63  lung]
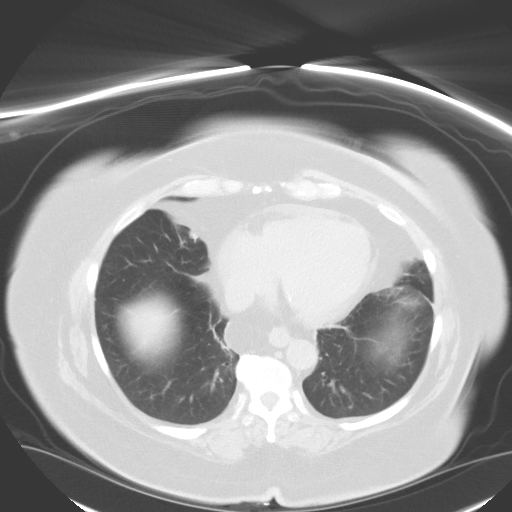
[im 29/63  lung]
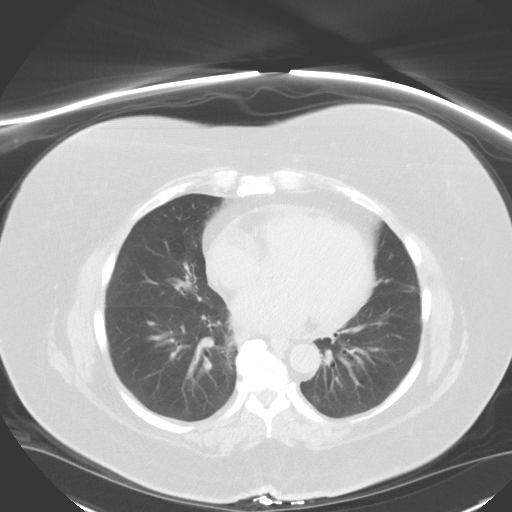
[im 30/63  lung]
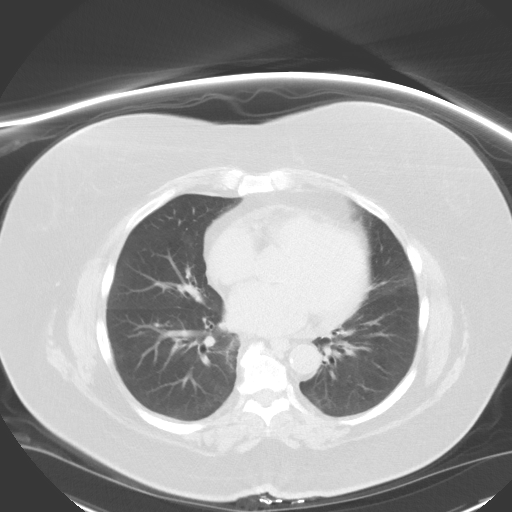
[im 32/63  lung]
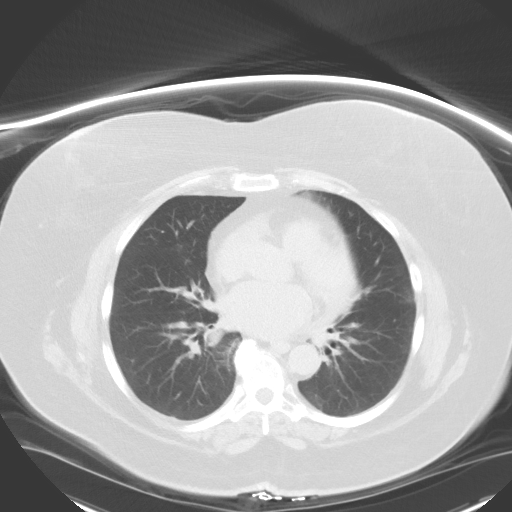
[im 34/63  mediastinal]
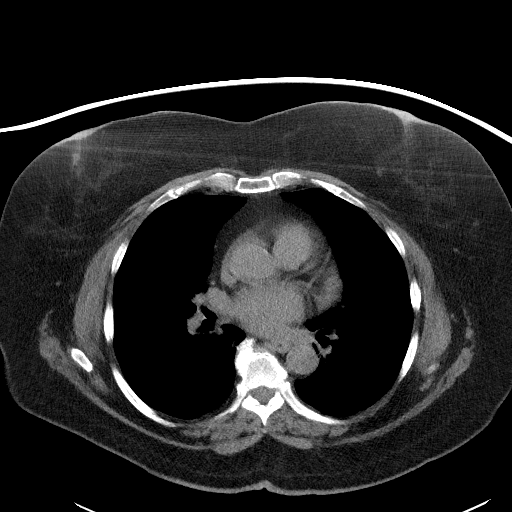
[im 34/63  lung]
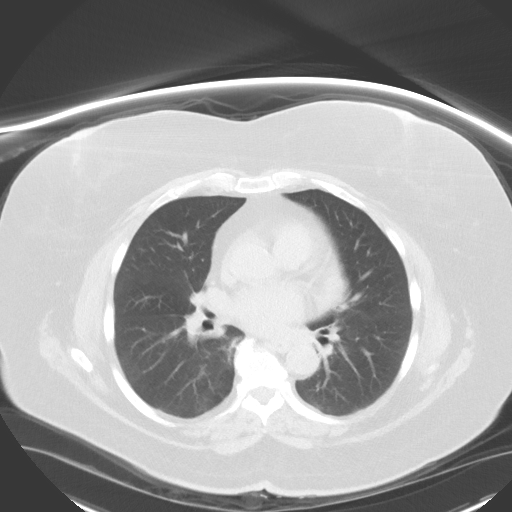
[im 39/63  lung]
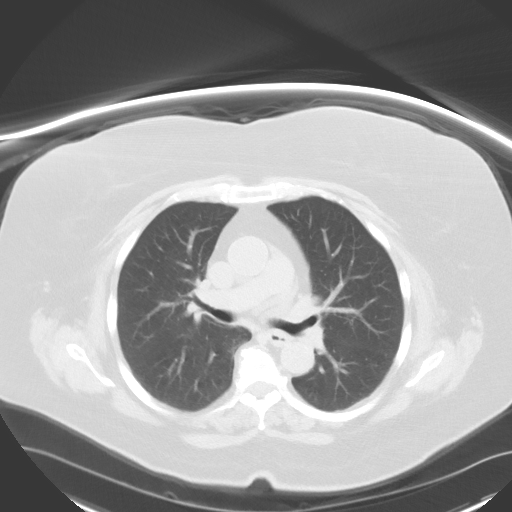
[im 43/63  lung]
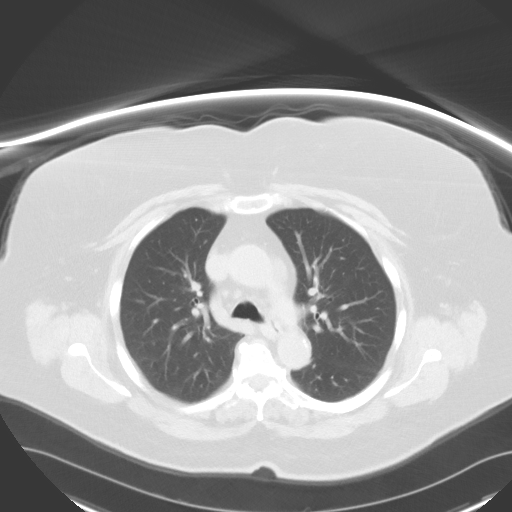
[im 48/63  lung]
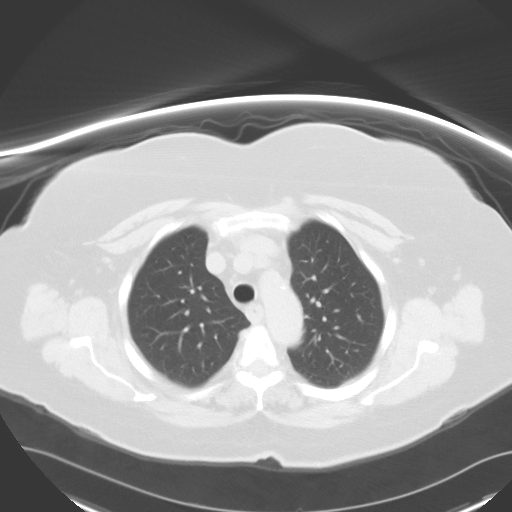
[im 53/63  mediastinal]
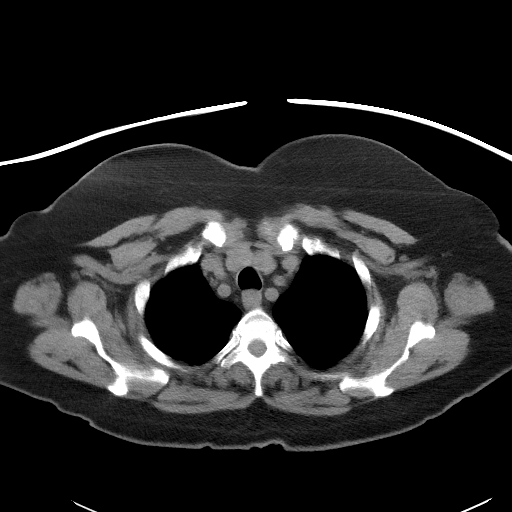
[im 53/63  lung]
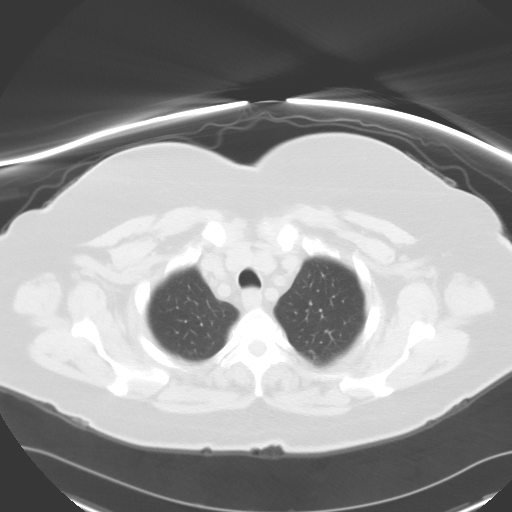
[im 58/63  lung]
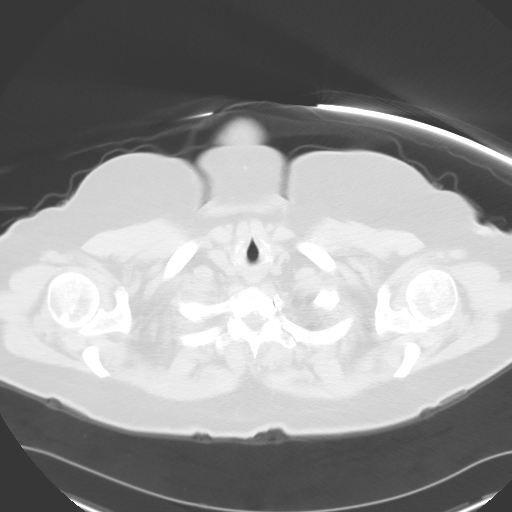

[14 of 32 positions shown; findings below may reference images not displayed]

PROCEDURE:     KCT - KCT CHEST WITHOUT CONTRAST  - [DATE]  [DATE]

RESULT:     Axial CT scanning was performed through the chest at 5 mm
intervals and slice thicknesses without IV contrast. Comparison is made to
the noncontrast study [DATE]. Review of multiplanar reconstructed
images was performed separately on the VIA monitor. The patient is
undergoing followup of a nodule in the left lower lobe.

Today on image 28 there is a stable appearing soft tissue density nodule
which lies in the anterior/superior aspect of the left lower lobe just
lateral to the left hilum and descending thoracic aorta. It contains no
calcification and measures approximately 1.2 cm in diameter today. On image
40 there is a 2 mm diameter nodule demonstrated in the posterior aspect of
the left lower lobe. This was only faintly visible on the previous study but
this could certainly be due to volume averaging. I do not see new nodules
elsewhere within the thorax.

The cardiac chambers are normal in size. There is a small pericardial
effusion which is not significantly changed since the previous study. There
is no pleural effusion. The caliber of the thoracic aorta is normal. I see
no pathologic sized mediastinal or hilar lymph nodes. Within the upper
abdomen the observed portions of the liver and spleen are normal. There are
gallstones present. There are there are no adrenal masses identified.
IMPRESSION: 1. There is a stable dominant nodule in the superior segment of the left
lower lobe. There is a tiny nodule posteriorly and inferiorly in the left
lower lobe as well that is likely stable. I see no new nodules elsewhere.
2. There is a tiny stable pericardial effusion.
3. I see no mediastinal or hilar lymphadenopathy nor pleural effusion.
4. There are gallstones present.

## 2009-10-30 ENCOUNTER — Ambulatory Visit: Payer: Self-pay | Admitting: Internal Medicine

## 2009-10-30 IMAGING — US ABDOMEN ULTRASOUND LIMITED
1 series · 17 of 25 positions shown · non-contrast
Comparison: none

REASON FOR EXAM: Gallstones Shown on CT [DATE]
COMMENTS:

[Series 1: abdomen ultrasound limited · 17 of 63 slices shown]
[im 1/63]
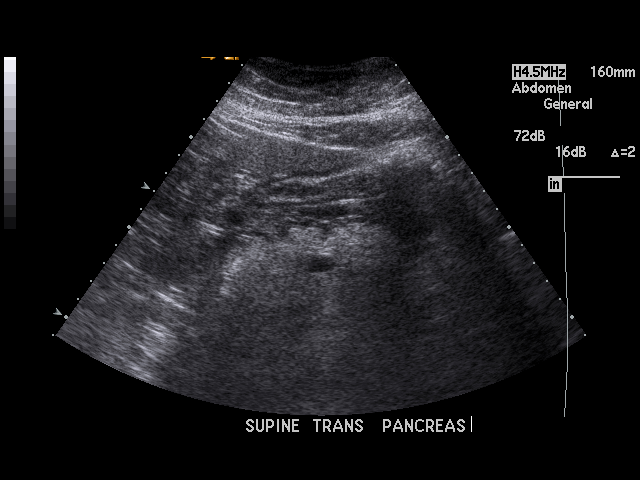
[im 6/63]
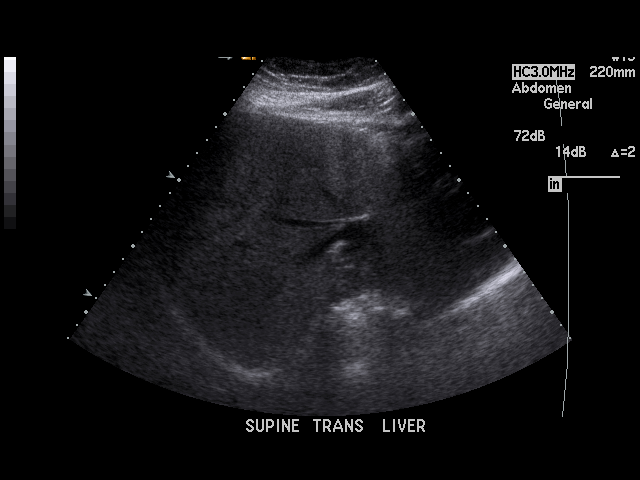
[im 8/63]
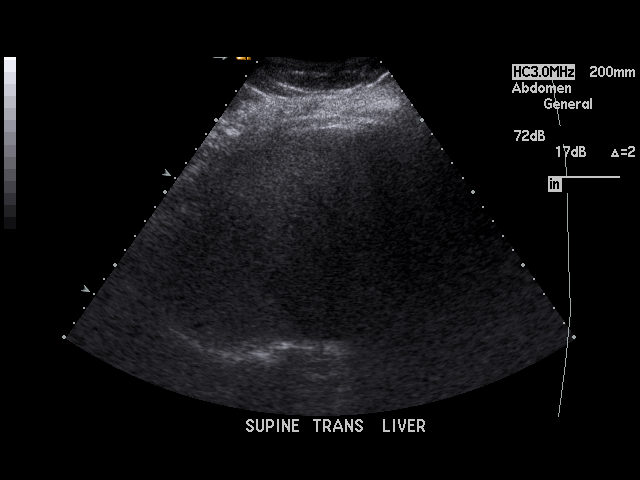
[im 13/63]
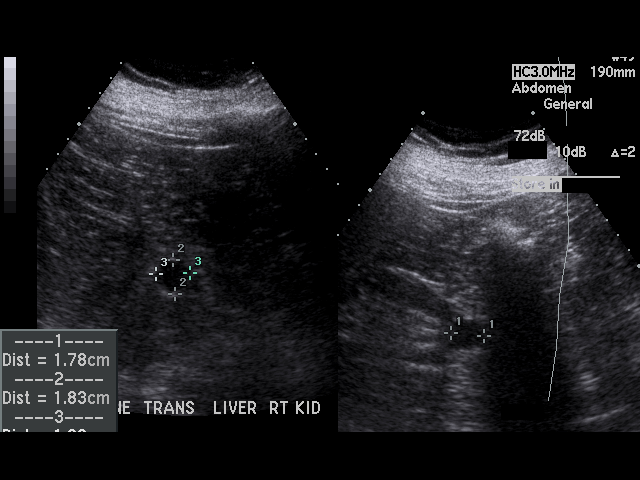
[im 16/63]
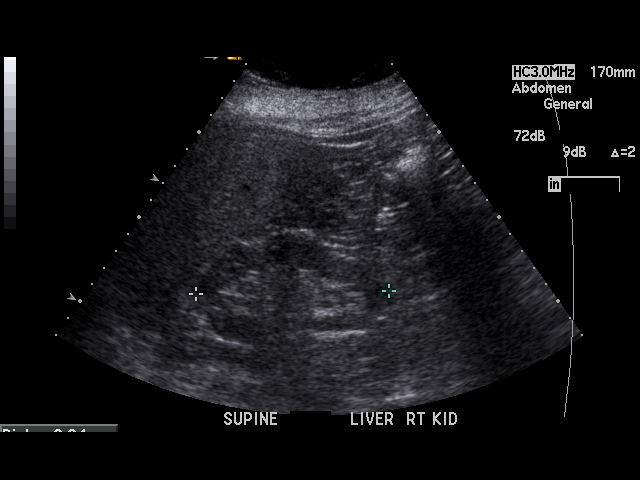
[im 21/63]
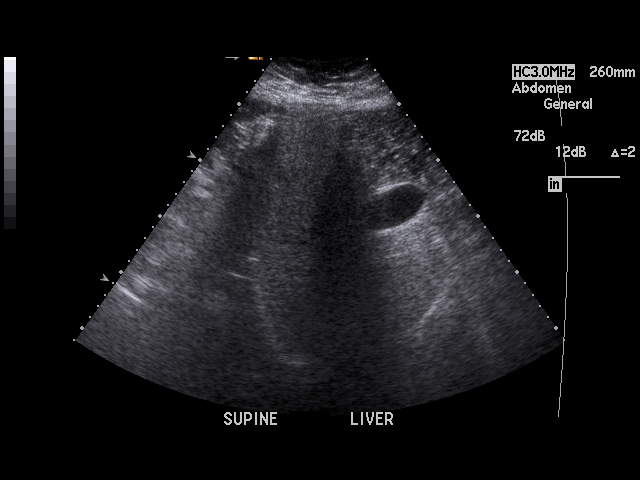
[im 24/63]
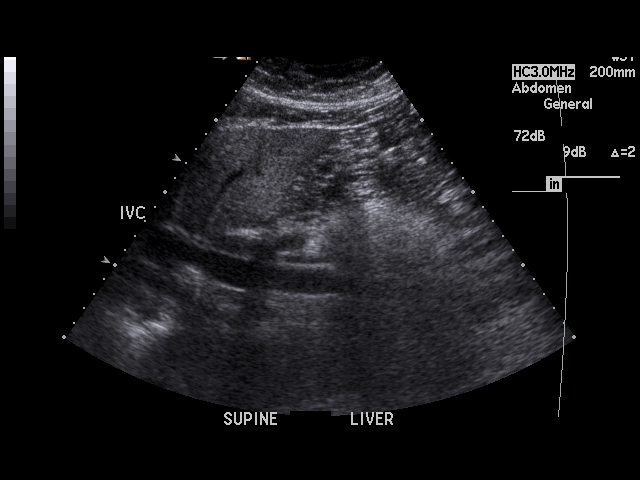
[im 29/63]
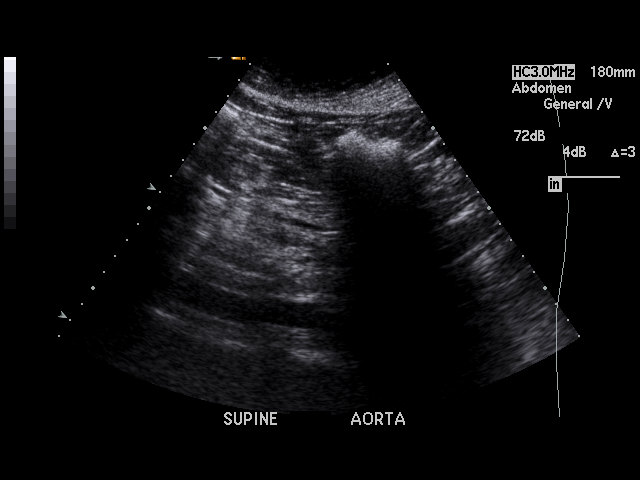
[im 32/63]
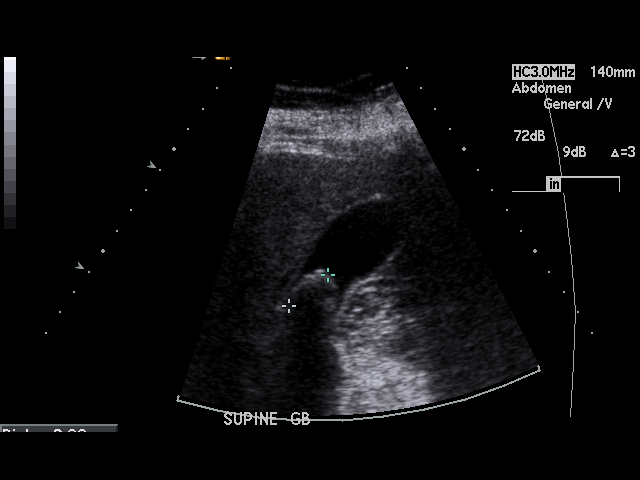
[im 34/63]
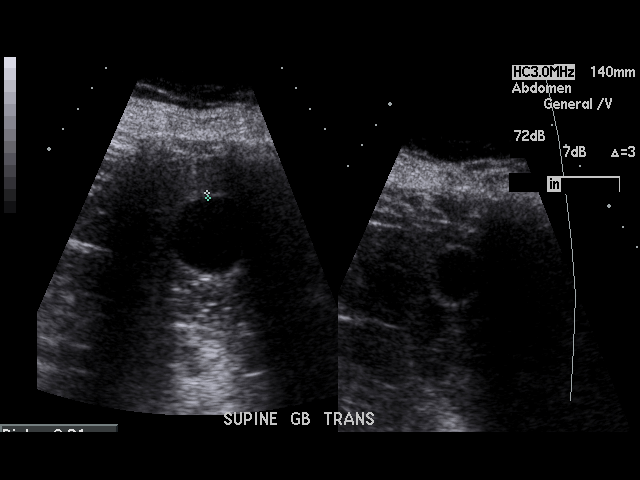
[im 39/63]
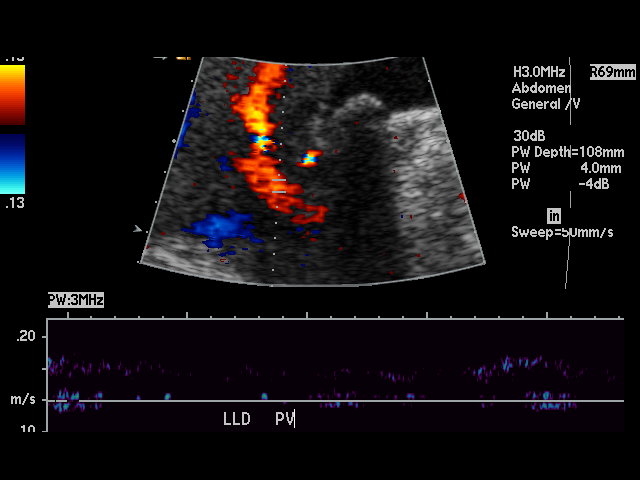
[im 42/63]
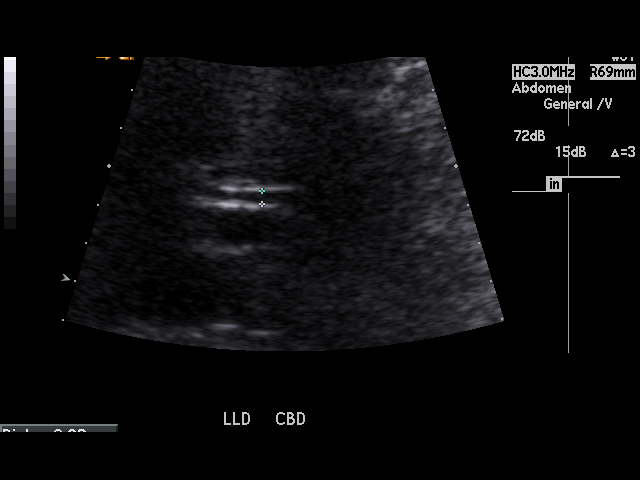
[im 47/63]
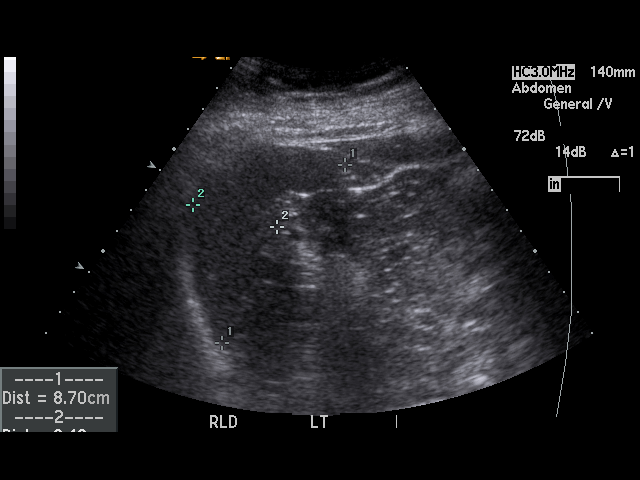
[im 50/63]
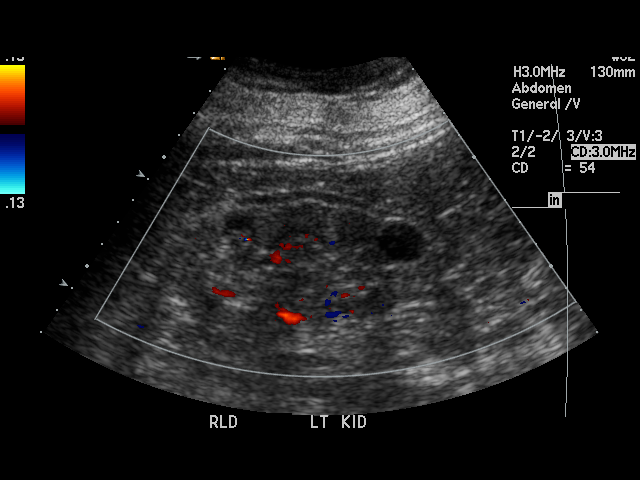
[im 55/63]
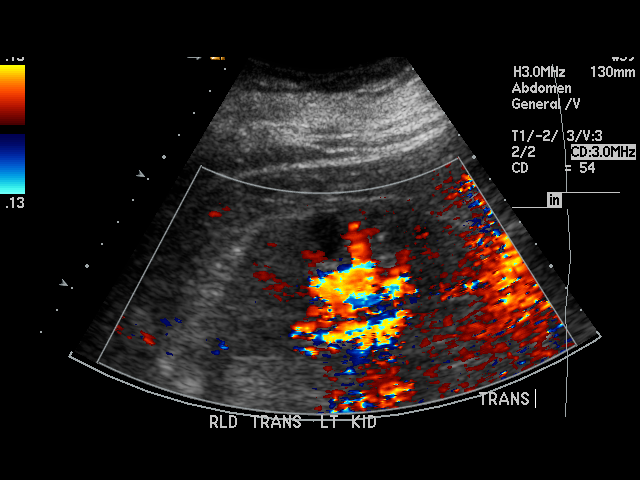
[im 57/63]
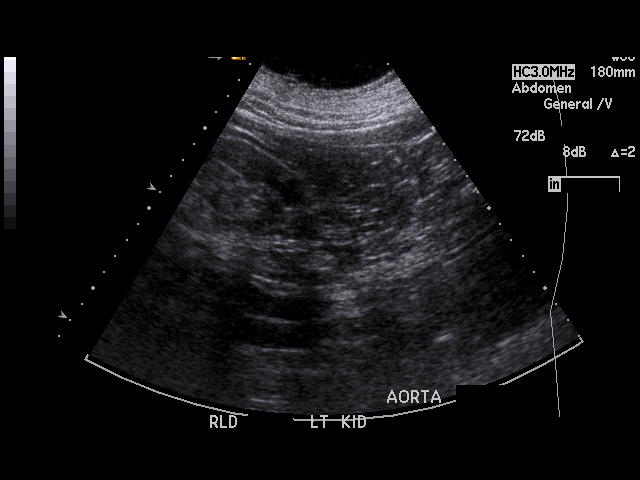
[im 63/63]
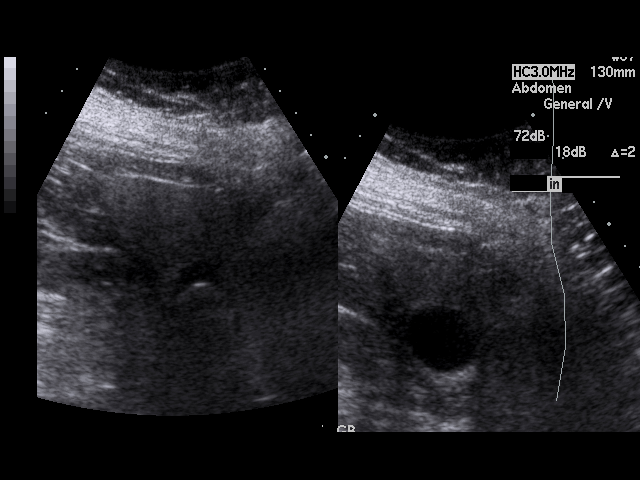

[17 of 25 positions shown; findings below may reference images not displayed]

PROCEDURE:     DIXEN - DIXEN ABDOMEN UPPER GENERAL  - [DATE] [DATE]

RESULT:     Ultrasound of the abdomen is performed in the standard fashion.
The pancreatic tail is not well seen. The visualized portions of the
pancreas show no gross abnormality. The liver demonstrates increased
echotexture in a homogeneous fashion consistent with fatty infiltration.
Cholelithiasis is present with a single large nonmobile stone present. There
is no sonographic Murphy's sign. The spleen is normal in appearance. There
are numerous renal cysts present. The kidneys are otherwise unremarkable.
The gallbladder wall thickness is 2.1 mm. The common bile duct diameter is
3.9 mm. The portal venous flow is normal.
IMPRESSION: 1. Cholelithiasis.
2. Renal cysts.
3. Fatty infiltration of the liver.
4. Incomplete visualization of the pancreas especially in the pancreatic
tail region.

## 2010-05-11 ENCOUNTER — Ambulatory Visit: Payer: Self-pay | Admitting: Internal Medicine

## 2010-05-11 IMAGING — CT CT CHEST W/O CM
1 of 2 series · 14 of 30 positions shown, 18 images · non-contrast
Comparison: none

REASON FOR EXAM: PULMONARY NODULE L LOWER LOBE FOLLOW UP
COMMENTS:

[Series 2: chest w/o 5.0 i41f 3 · axial · non-contrast · 0.68mm/px · z∈[-26,+204]mm · 14 of 54 slices shown, 18 images]
[im 4/54  mediastinal]
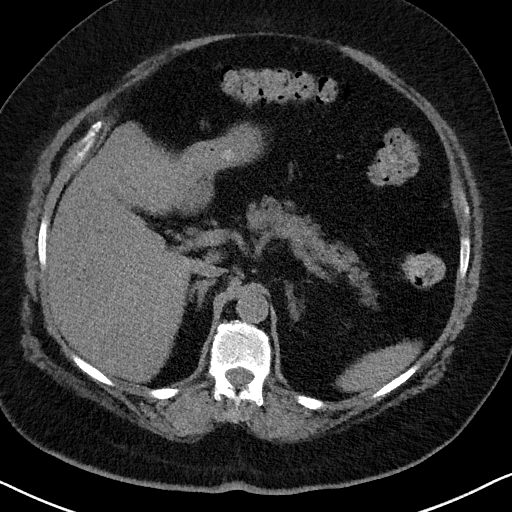
[im 4/54  lung]
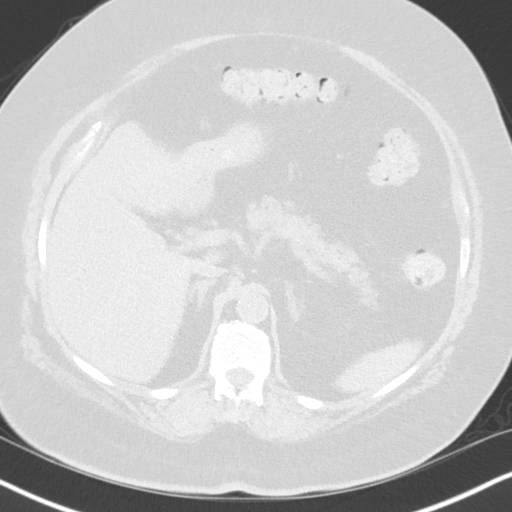
[im 8/54  lung]
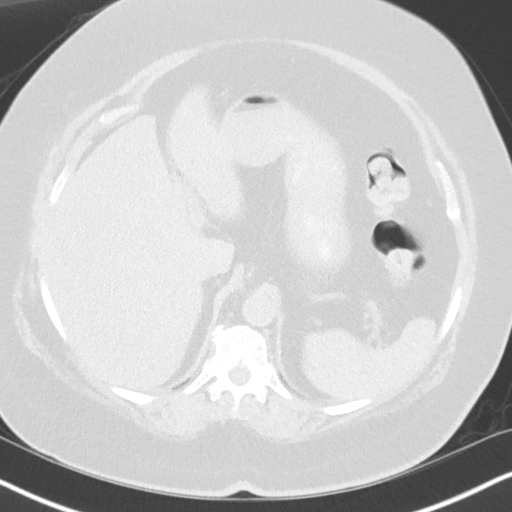
[im 12/54  lung]
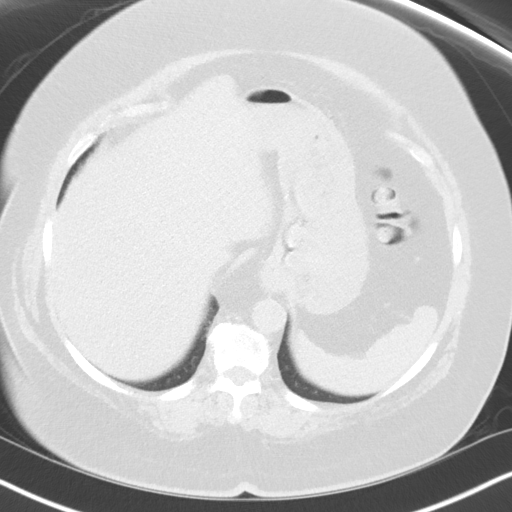
[im 16/54  lung]
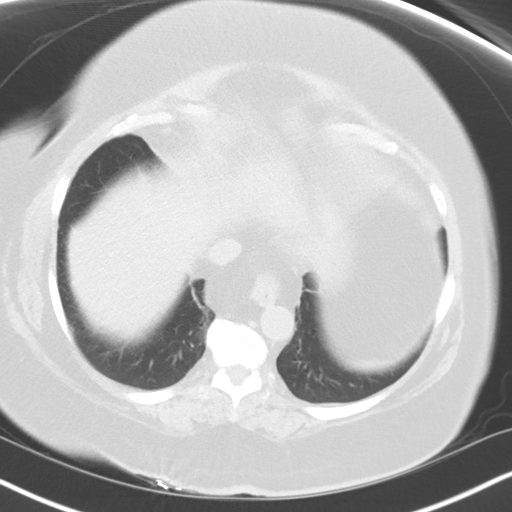
[im 19/54  mediastinal]
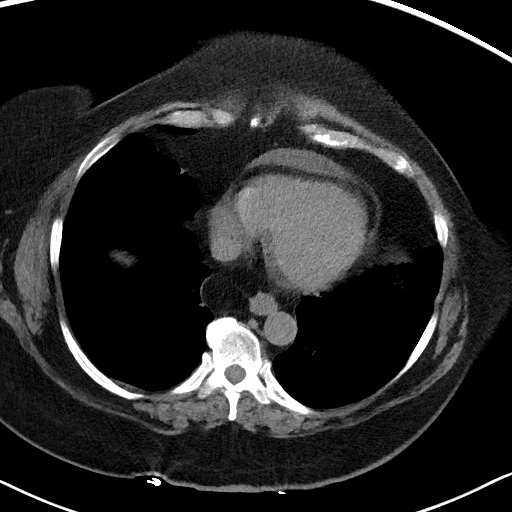
[im 19/54  lung]
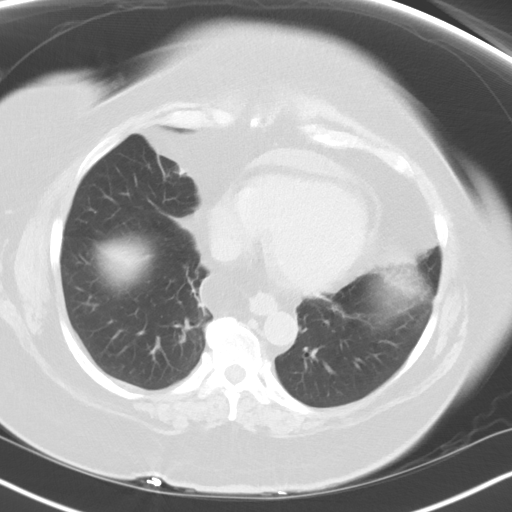
[im 23/54  lung]
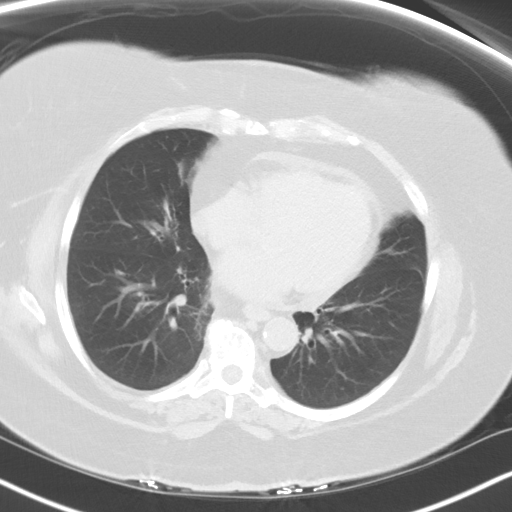
[im 26/54  lung]
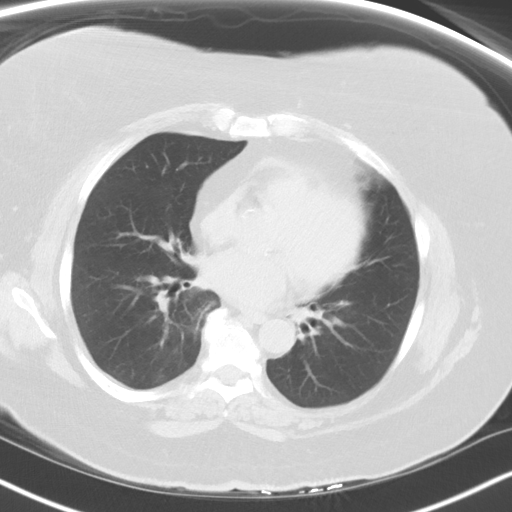
[im 27/54  lung]
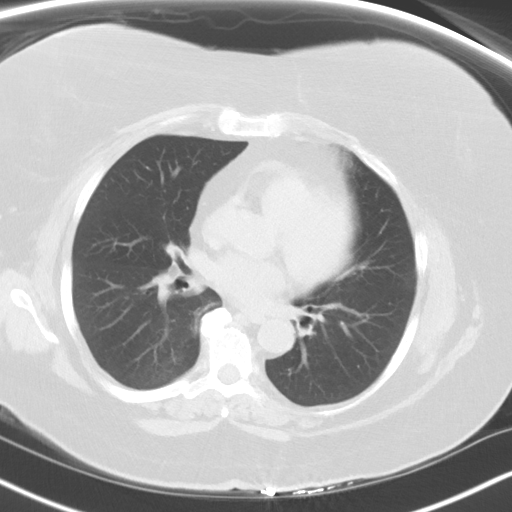
[im 31/54  mediastinal]
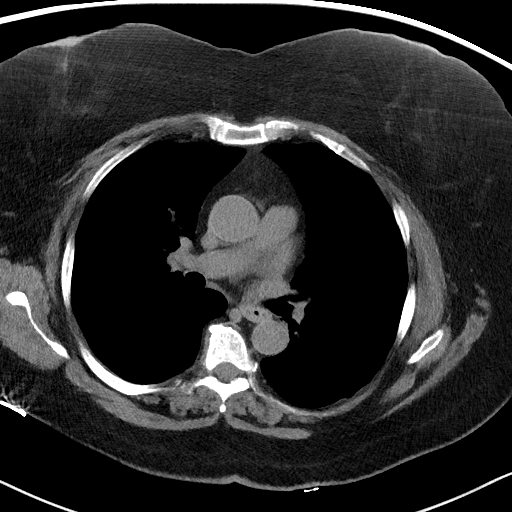
[im 31/54  lung]
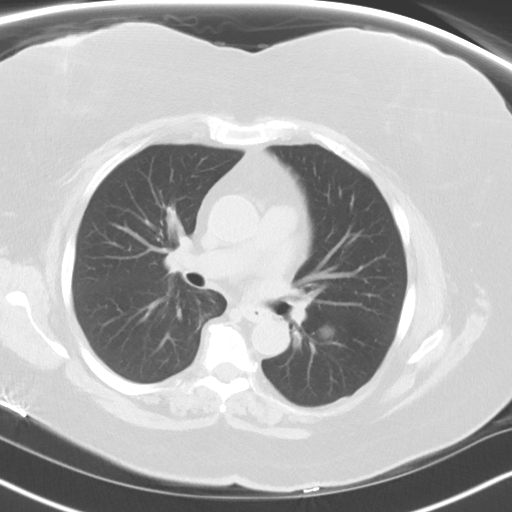
[im 35/54  lung]
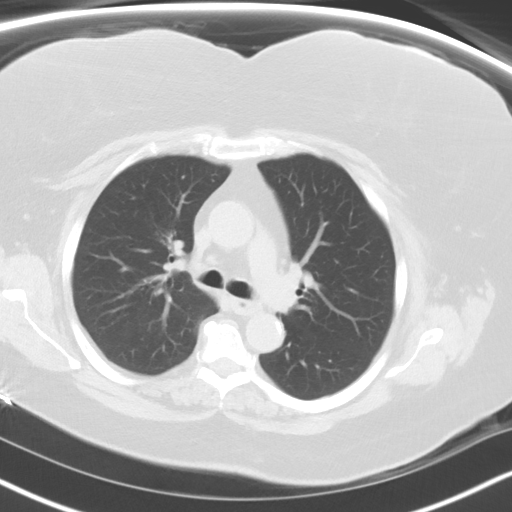
[im 38/54  lung]
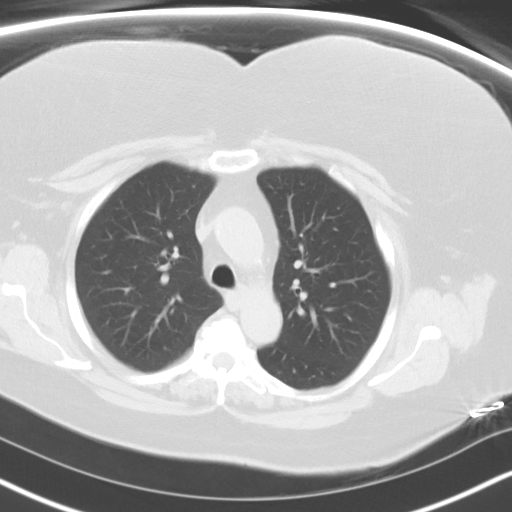
[im 42/54  lung]
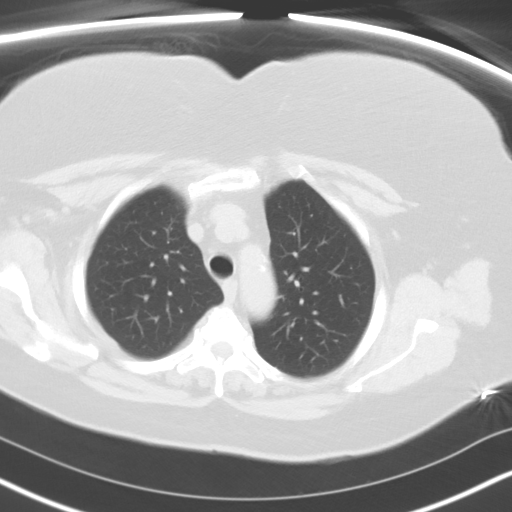
[im 46/54  mediastinal]
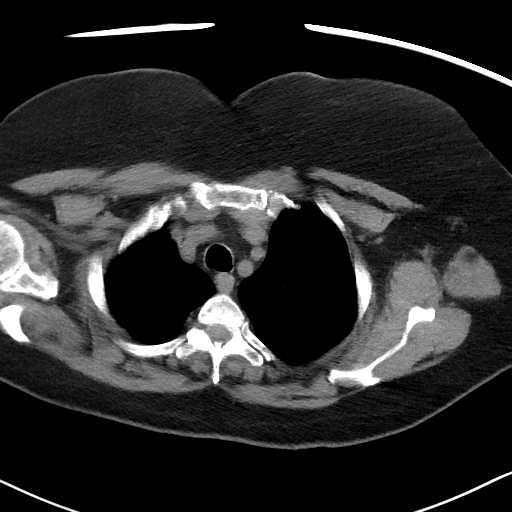
[im 46/54  lung]
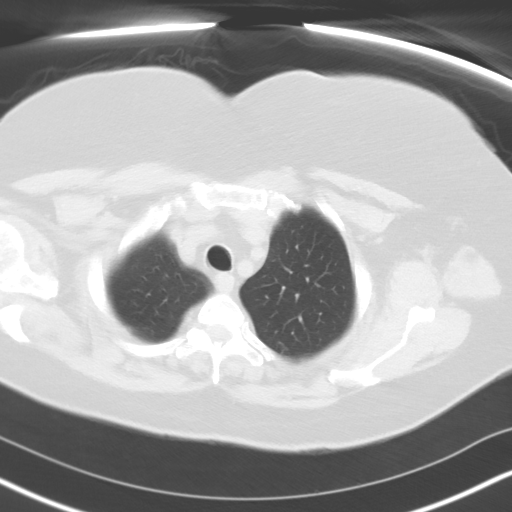
[im 50/54  lung]
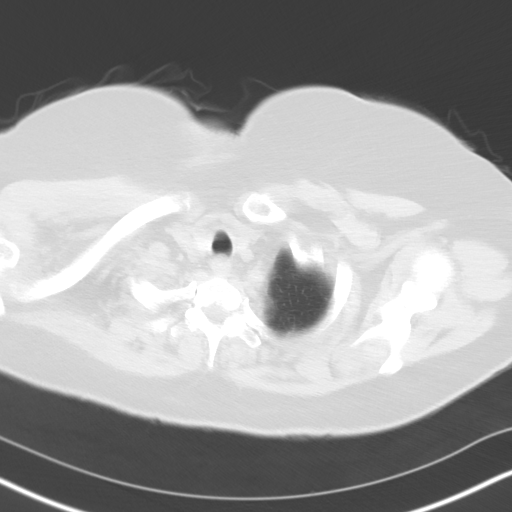

[14 of 30 positions shown; findings below may reference images not displayed]

PROCEDURE:     KCT - KCT CHEST WITHOUT CONTRAST  - [DATE]  [DATE]

RESULT:     CT of the chest without contrast is compared to previous images
from [DATE], as well as [DATE] and [DATE].

Lung window images again demonstrate a stable, smoothly marginated spherical
soft tissue density nodule in the superior segment of the left lower lobe
best appreciated on images #25 and #26. No associated calcification is
evident. No additional or new nodular densities are evident. The adrenal
glands and other upper abdominal structures appear to be unremarkable. There
is a trace amount of pericardial effusion or pericardial thickening which is
unchanged in appearance. No mediastinal or hilar mass or adenopathy is
evident. No axillary or supraclavicular mass or adenopathy is demonstrated.
The thoracic aorta shows atherosclerotic calcification. Bony degenerative
changes are noted in the thoracic spine with hypertrophic degenerative
spurring present.
IMPRESSION: 1. Stable, smoothly marginated spherical 14 mm nodule in the left lower lobe
superior segment.
2. Persistent minimal pericardial thickening versus pericardial effusion.

## 2010-10-30 ENCOUNTER — Ambulatory Visit: Payer: Self-pay | Admitting: Internal Medicine

## 2011-12-10 ENCOUNTER — Ambulatory Visit: Payer: Self-pay | Admitting: Internal Medicine

## 2011-12-10 IMAGING — MG MM CAD SCREENING MAMMO
1 series · 4 of 4 positions shown · non-contrast
Comparison: none

REASON FOR EXAM: SCR MAMMO NO ORDER
COMMENTS:

PROCEDURE:     MAM - MAM DGTL SCRN MAM NO ORDER W/CAD  - [DATE] [DATE]
RESULT:     Comparison is made to prior studies dated [DATE], [DATE],
and [DATE].

[R CC · right · 4 of 4 slices shown]
[im 1/4]
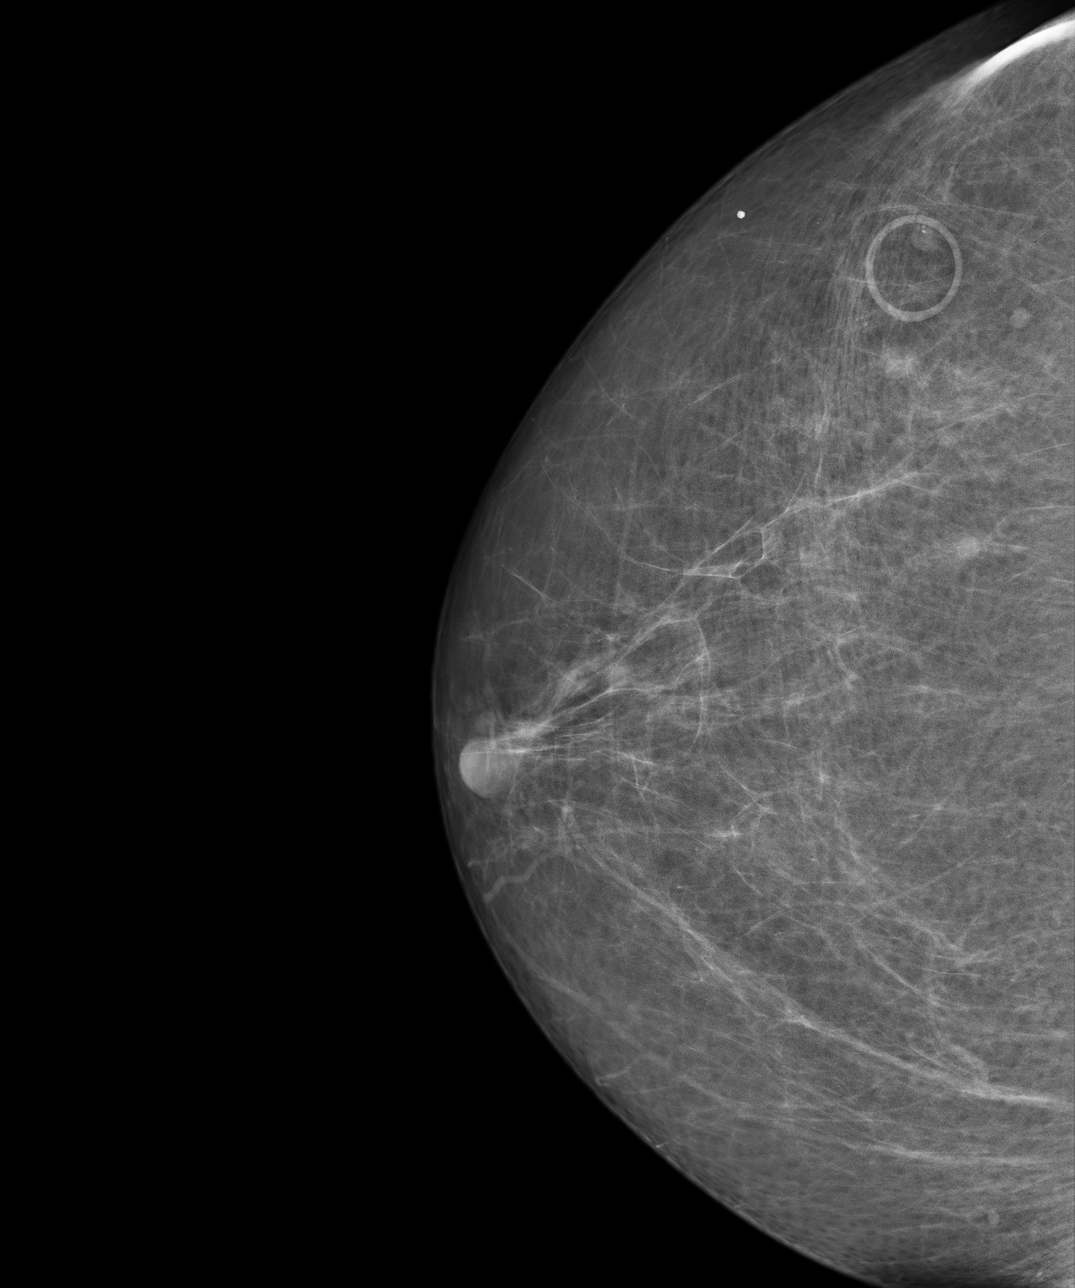
[im 2/4]
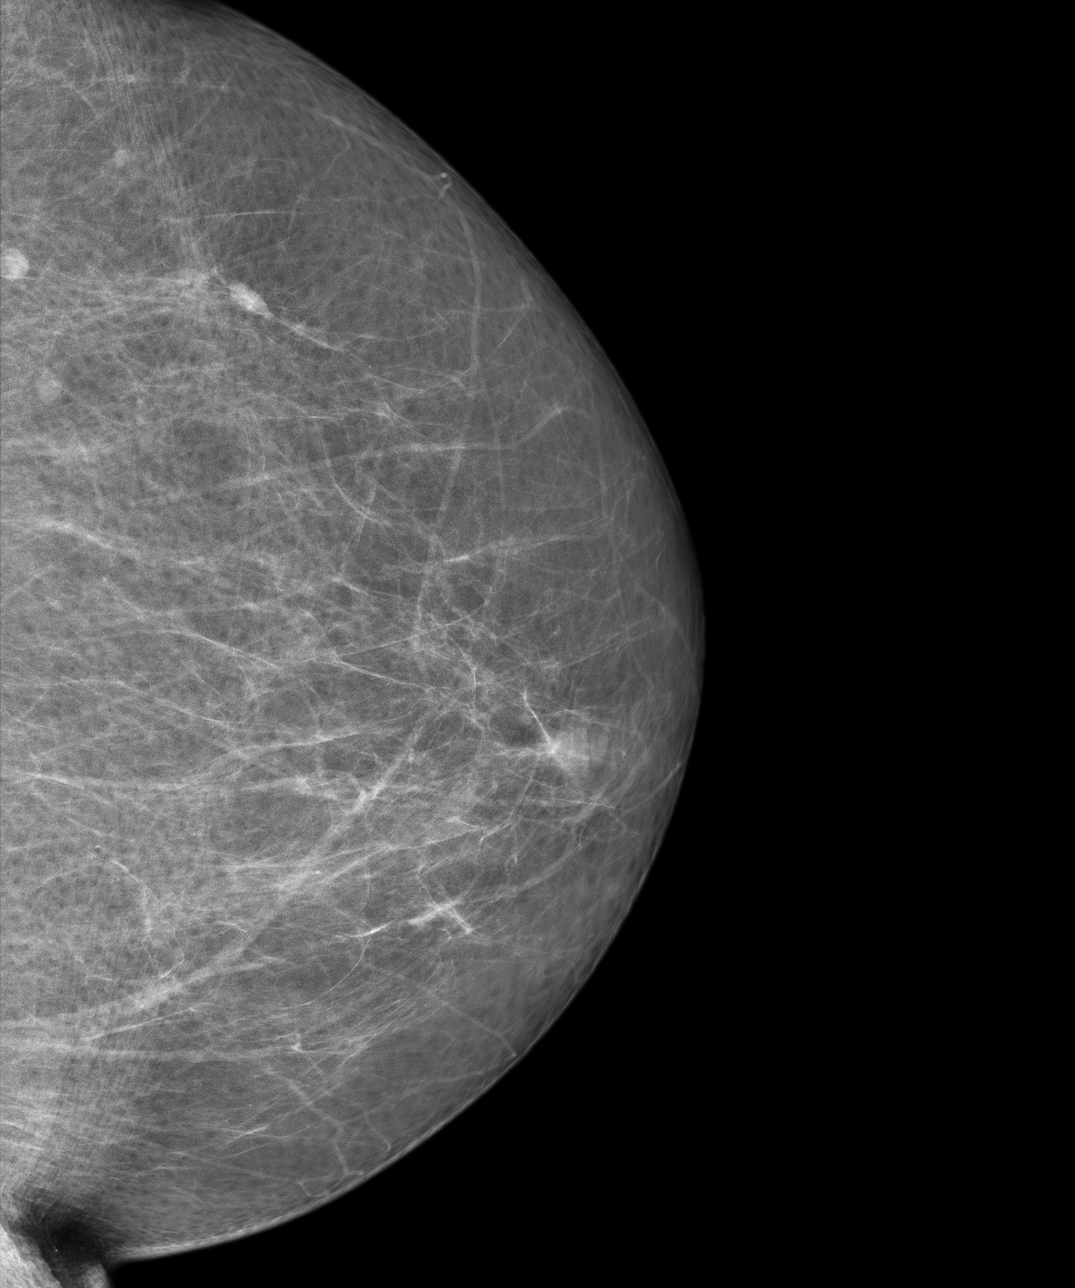
[im 3/4]
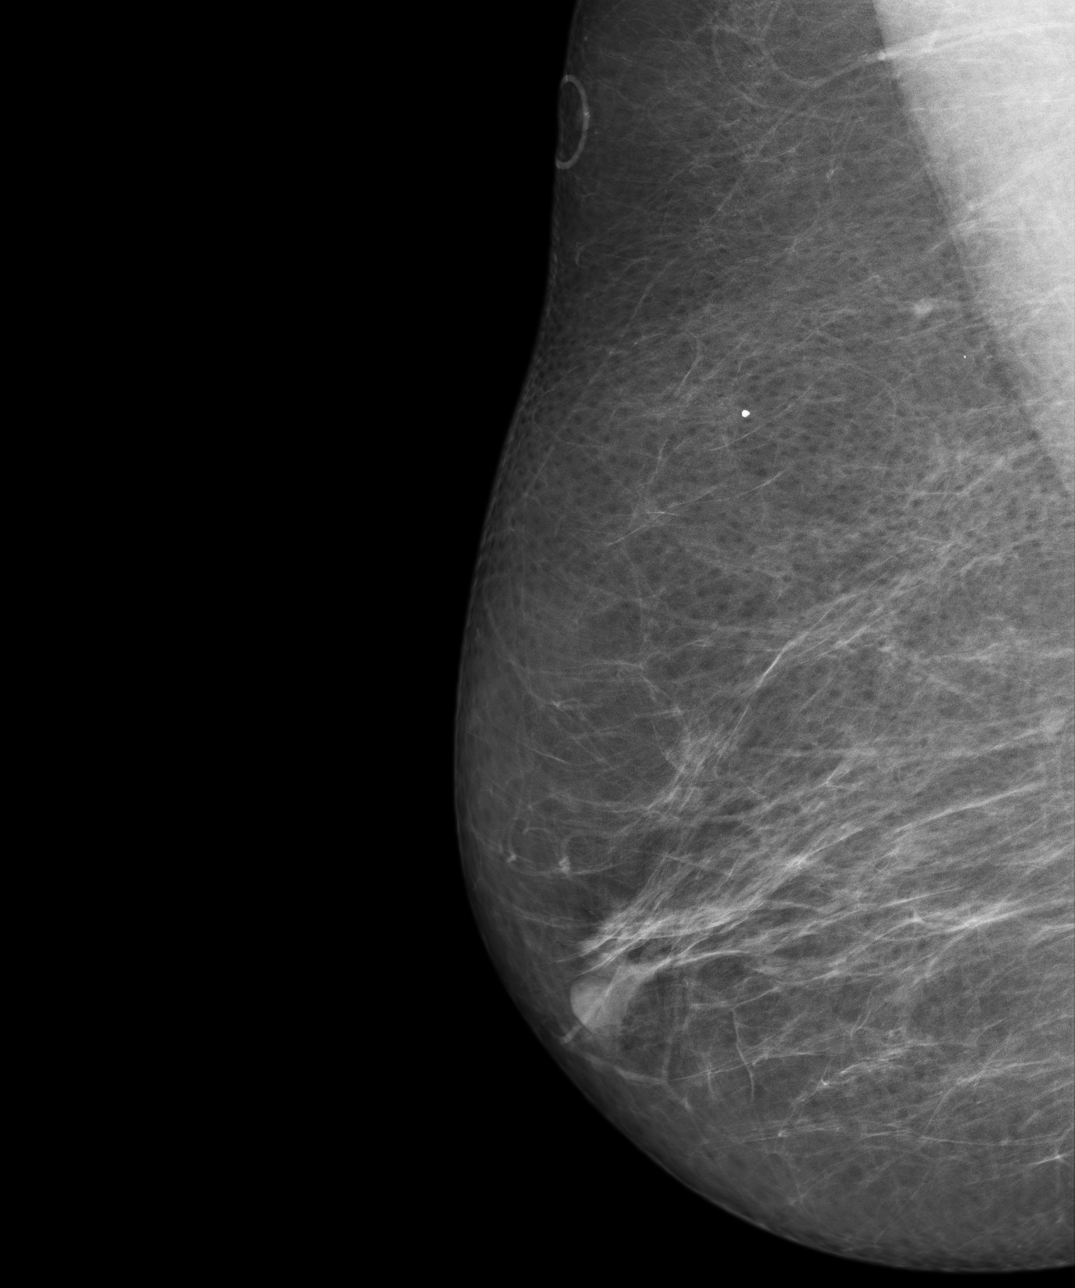
[im 4/4]
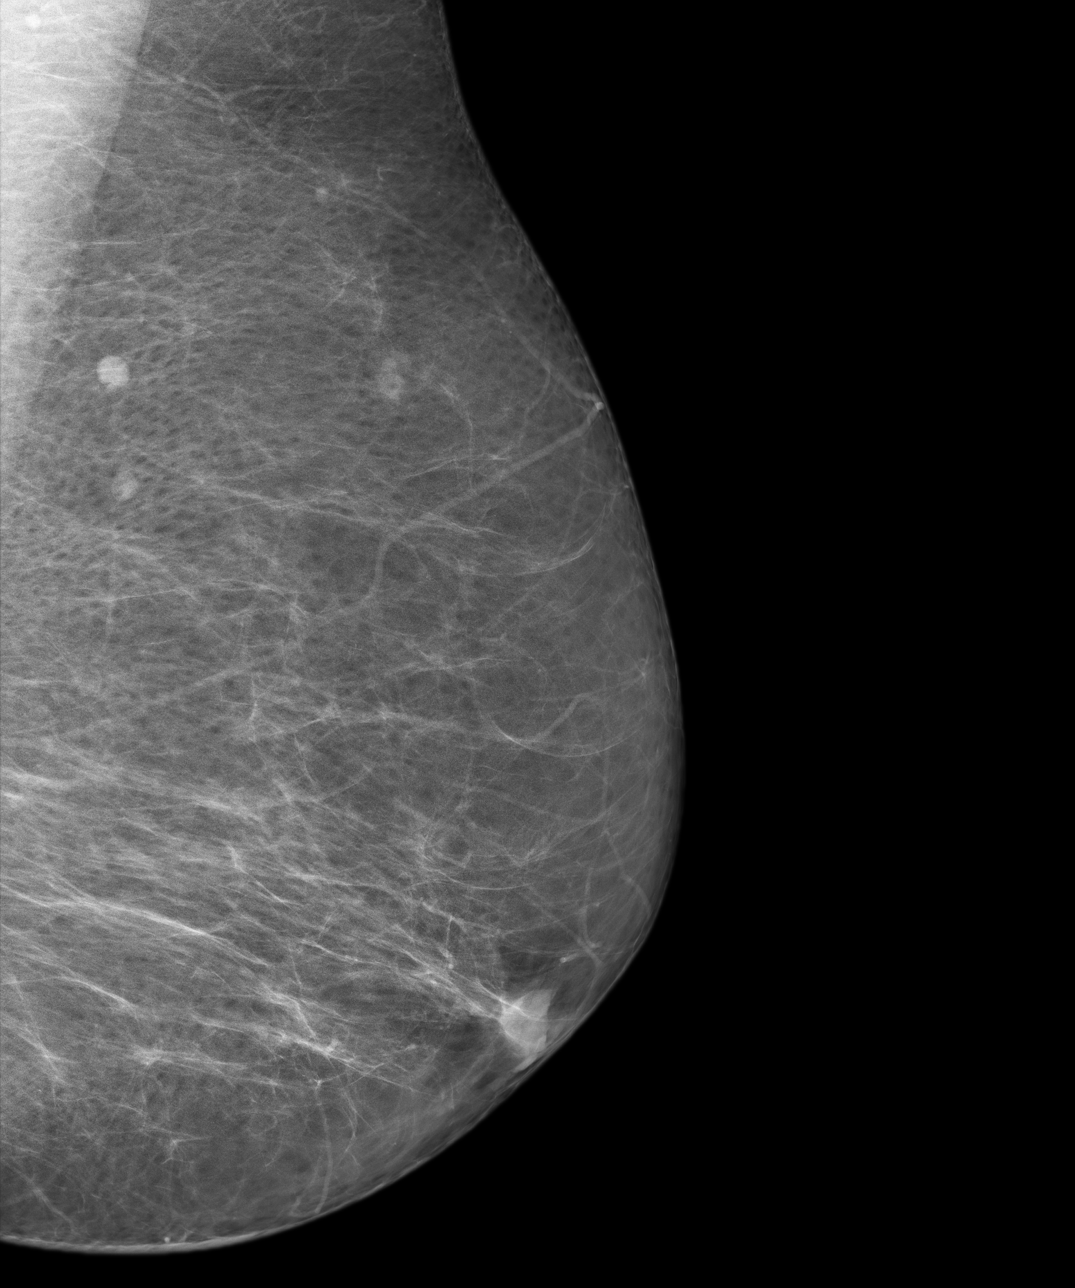

[4 of 4 positions shown; findings below may reference images not displayed]

FINDINGS: The breasts are primarily fatty involuted. Benign appearing,
stable nodular densities project within the right and left breast as well as
benign calcification in the left breast. There is no new radiographic
evidence to suggest malignancy.
IMPRESSION: BI-RADS: Category 2 - Benign Findings

Thank you for this opportunity to contribute to the care of your patient.

A NEGATIVE MAMMOGRAM REPORT DOES NOT PRECLUDE BIOPSY OR OTHER EVALUATION OF
A CLINICALLY PALPABLE OR OTHERWISE SUSPICIOUS MASS OR LESION. BREAST CANCER
MAY NOT BE DETECTED BY MAMMOGRAPHY IN UP TO 10% OF CASES.

## 2012-05-23 ENCOUNTER — Ambulatory Visit: Payer: Self-pay | Admitting: Internal Medicine

## 2012-05-23 IMAGING — CT CT CHEST W/O CM
1 of 2 series · 14 of 32 positions shown, 18 images · non-contrast
Comparison: [DATE], [DATE]

REASON FOR EXAM: FU lung nodule
COMMENTS:

PROCEDURE:     KCT - KCT CHEST WITHOUT CONTRAST  - [DATE] [DATE]
RESULT:     Indication: Lung nodule
TECHNIQUE: Multiple axial images of the chest are obtained without
intravenous contrast.

[Series 2: chest w/o 3.0 i31f 2 · axial · non-contrast · 0.76mm/px · z∈[-673,-415]mm · 14 of 102 slices shown, 18 images]
[im 8/102  mediastinal]
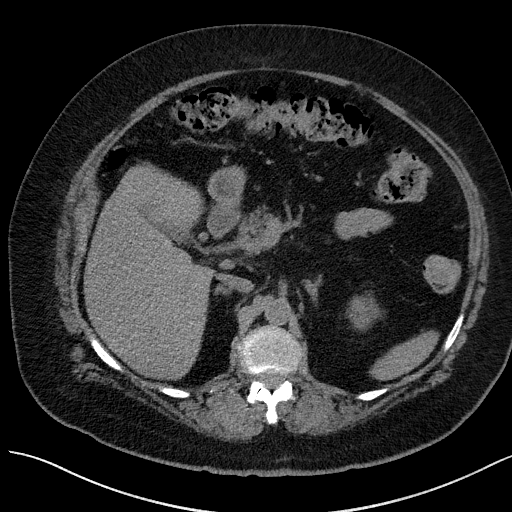
[im 8/102  lung]
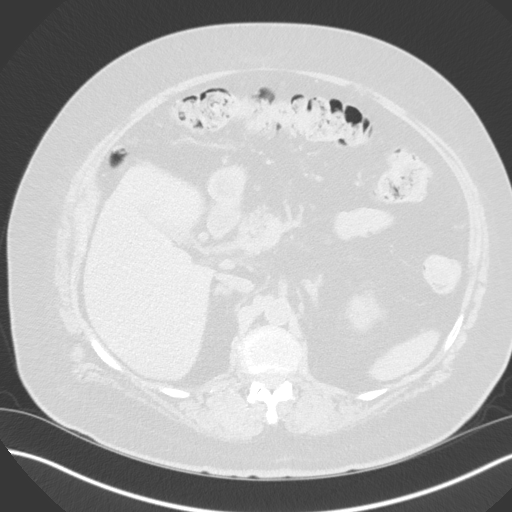
[im 16/102  lung]
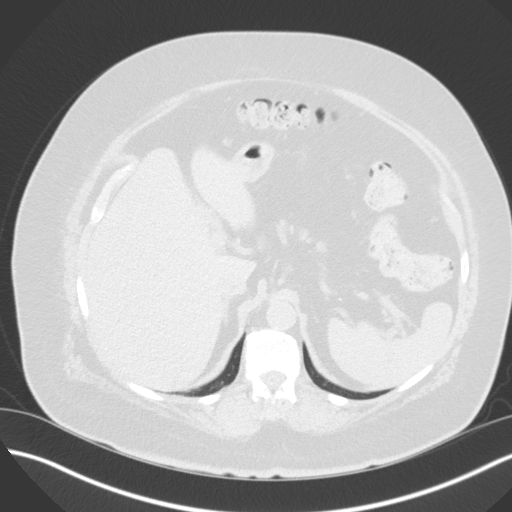
[im 24/102  lung]
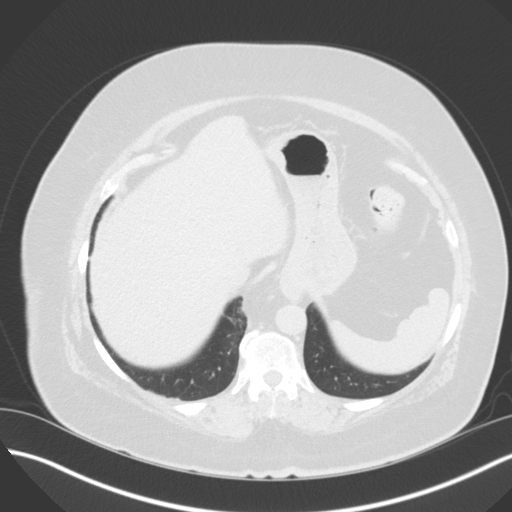
[im 32/102  lung]
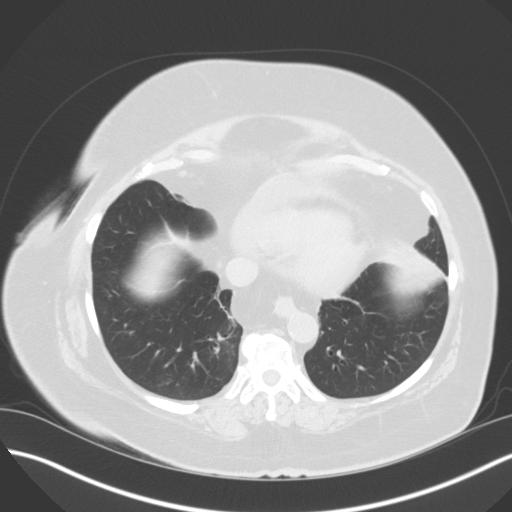
[im 39/102  mediastinal]
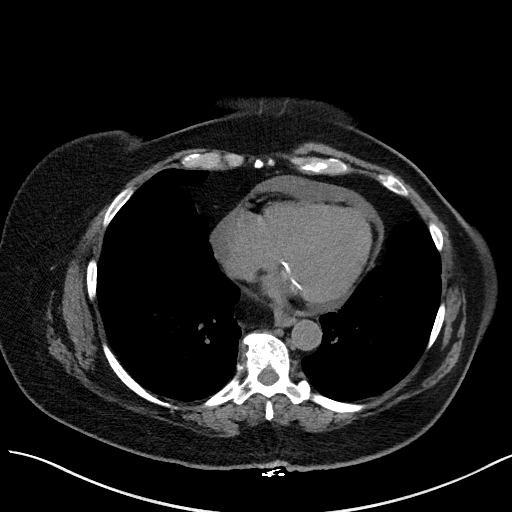
[im 39/102  lung]
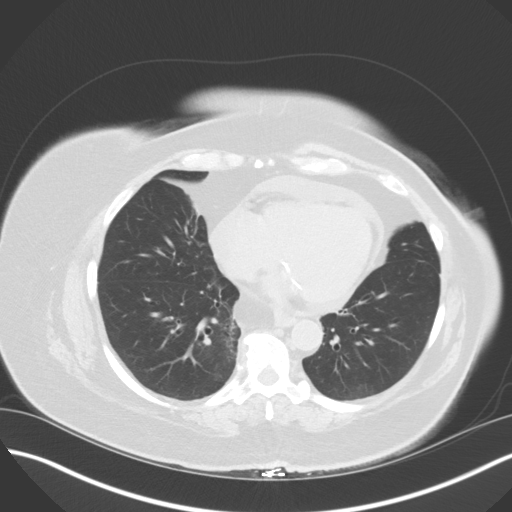
[im 47/102  lung]
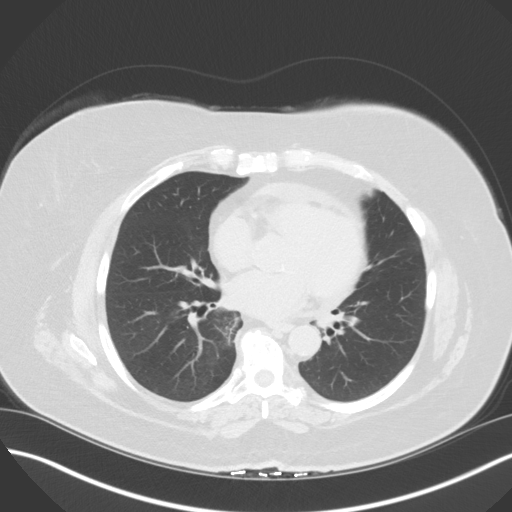
[im 49/102  lung]
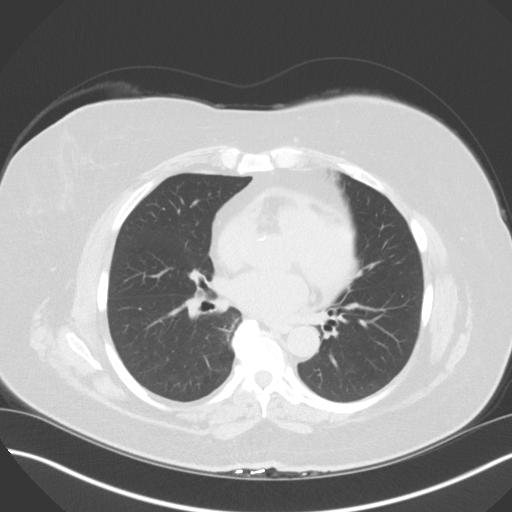
[im 51/102  lung]
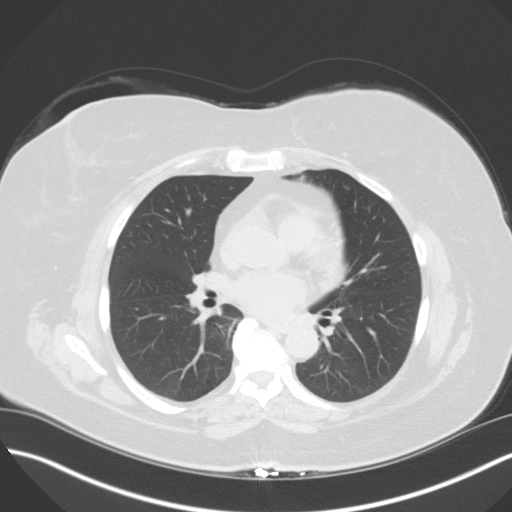
[im 55/102  mediastinal]
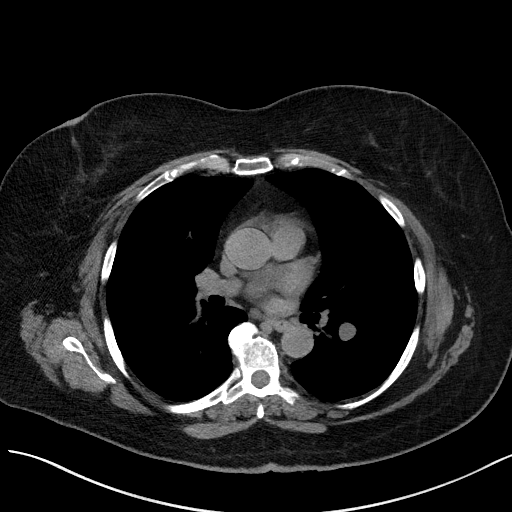
[im 55/102  lung]
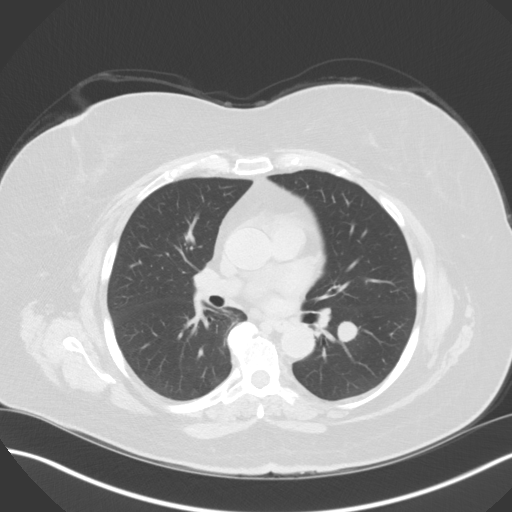
[im 63/102  lung]
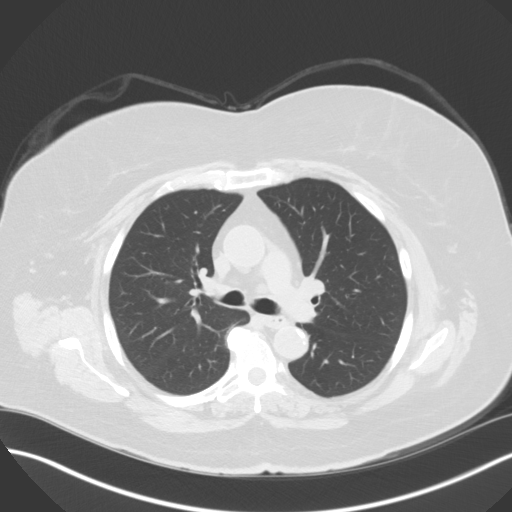
[im 70/102  lung]
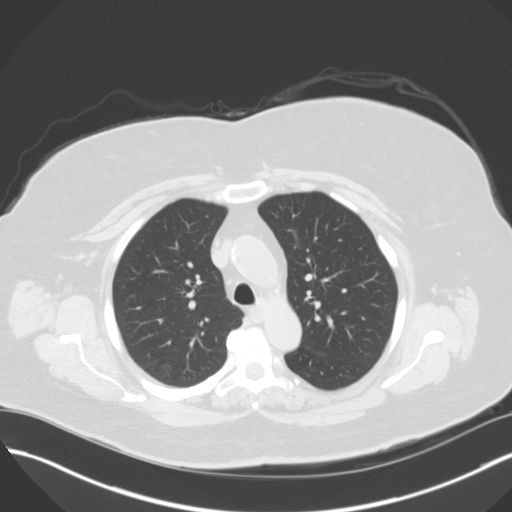
[im 78/102  lung]
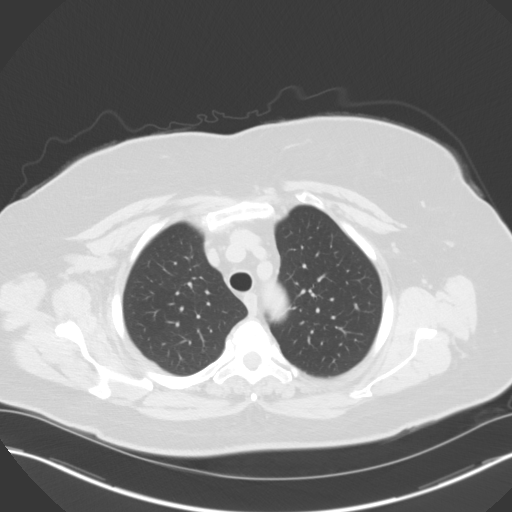
[im 86/102  mediastinal]
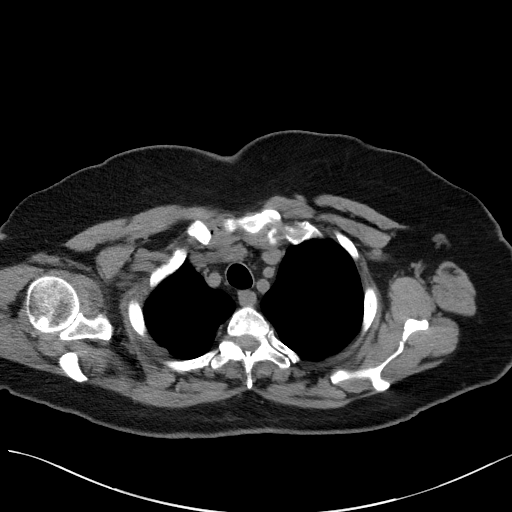
[im 86/102  lung]
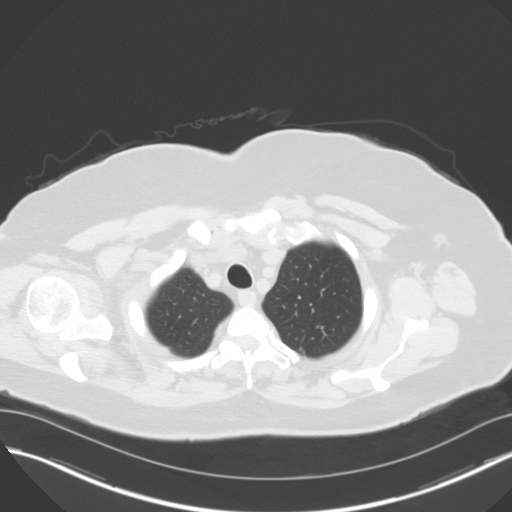
[im 94/102  lung]
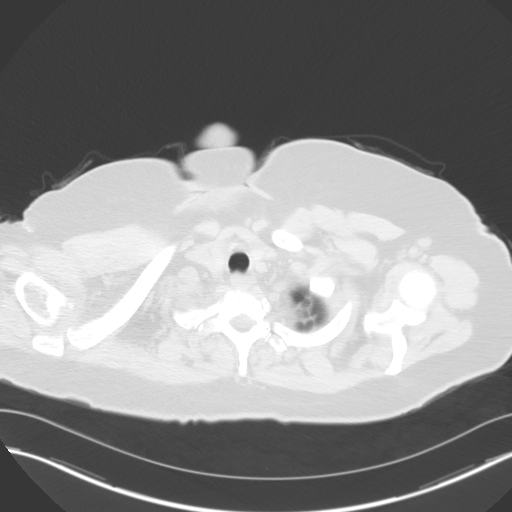

[14 of 32 positions shown; findings below may reference images not displayed]

FINDINGS: The central airways are patent. There is a 17 mm nodule in the superior
segment of the left lower lobe which is unchanged compared with [DATE].
There is no pleural effusion or pneumothorax.

There are no pathologically enlarged axillary, hilar, or mediastinal lymph
nodes.

The heart size is normal. There is a pericardial effusion measuring 16 mm
anteriorly. The thoracic aorta is normal in caliber.

Review of bone windows demonstrates no focal lytic or sclerotic lesions.

Limited noncontrast images of the upper abdomen were obtained. The adrenal
glands appear normal. There is cholelithiasis.
IMPRESSION: 1. There is a 17 mm left lower lobe pulmonary nodule which is slightly
increased in size compared with [DATE] at which time the nodule measured
14 mm. Differential diagnosis includes an inflammatory nodule versus sequela
of granulomatous disease or fungal disease versus indolent malignancy.

2. Small pericardial effusion.

3. Cholelithiasis.

[REDACTED]

## 2012-07-11 ENCOUNTER — Ambulatory Visit: Payer: Self-pay | Admitting: General Practice

## 2012-07-11 LAB — BASIC METABOLIC PANEL
Anion Gap: 4 — ABNORMAL LOW (ref 7–16)
BUN: 36 mg/dL — ABNORMAL HIGH (ref 7–18)
Calcium, Total: 10 mg/dL (ref 8.5–10.1)
Chloride: 108 mmol/L — ABNORMAL HIGH (ref 98–107)
Creatinine: 1.24 mg/dL (ref 0.60–1.30)
EGFR (African American): 50 — ABNORMAL LOW
EGFR (Non-African Amer.): 43 — ABNORMAL LOW
Glucose: 125 mg/dL — ABNORMAL HIGH (ref 65–99)
Potassium: 3.8 mmol/L (ref 3.5–5.1)

## 2012-07-11 LAB — URINALYSIS, COMPLETE
Bilirubin,UR: NEGATIVE
Blood: NEGATIVE
Glucose,UR: NEGATIVE mg/dL (ref 0–75)
Ketone: NEGATIVE
Ph: 5 (ref 4.5–8.0)
RBC,UR: 1 /HPF (ref 0–5)
Specific Gravity: 1.021 (ref 1.003–1.030)
WBC UR: 2 /HPF (ref 0–5)

## 2012-07-11 LAB — CBC
MCHC: 33.8 g/dL (ref 32.0–36.0)
MCV: 91 fL (ref 80–100)
Platelet: 288 10*3/uL (ref 150–440)
RBC: 4.11 10*6/uL (ref 3.80–5.20)

## 2012-07-11 LAB — SEDIMENTATION RATE: Erythrocyte Sed Rate: 25 mm/hr (ref 0–30)

## 2012-07-11 LAB — MRSA PCR SCREENING

## 2012-07-11 LAB — APTT: Activated PTT: 29.2 secs (ref 23.6–35.9)

## 2012-07-12 LAB — URINE CULTURE

## 2012-07-26 ENCOUNTER — Inpatient Hospital Stay: Payer: Self-pay | Admitting: General Practice

## 2012-07-26 IMAGING — CR DG KNEE 1-2V*R*
1 series · 2 of 2 positions shown · non-contrast
Comparison: none

REASON FOR EXAM: postop
COMMENTS:   Bedside (portable):Y

PROCEDURE:     DXR - DXR KNEE RIGHT AP AND LATERAL  - [DATE]  [DATE]
RESULT:

[Series 1: ap · 0.17mm/px · 2 of 2 slices shown]
[im 1/2]
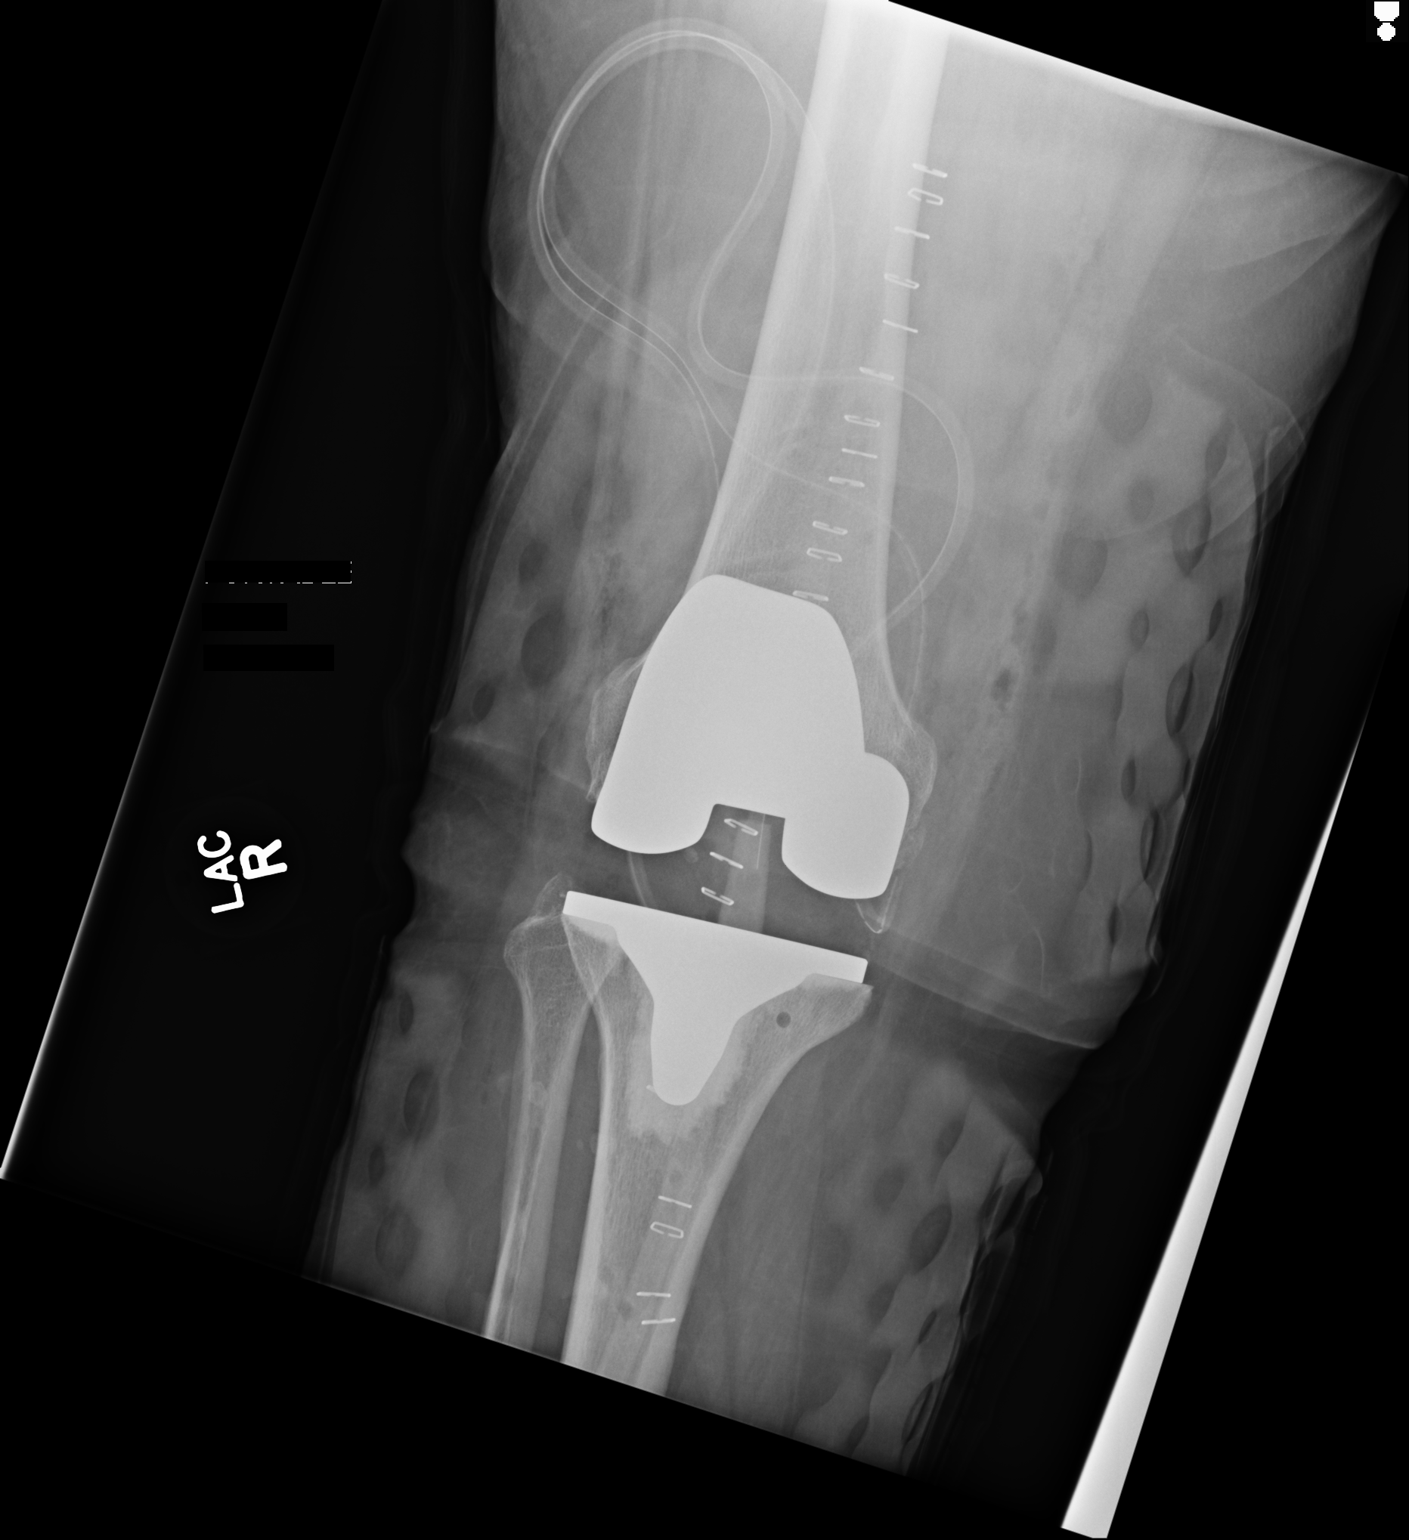
[im 2/2]
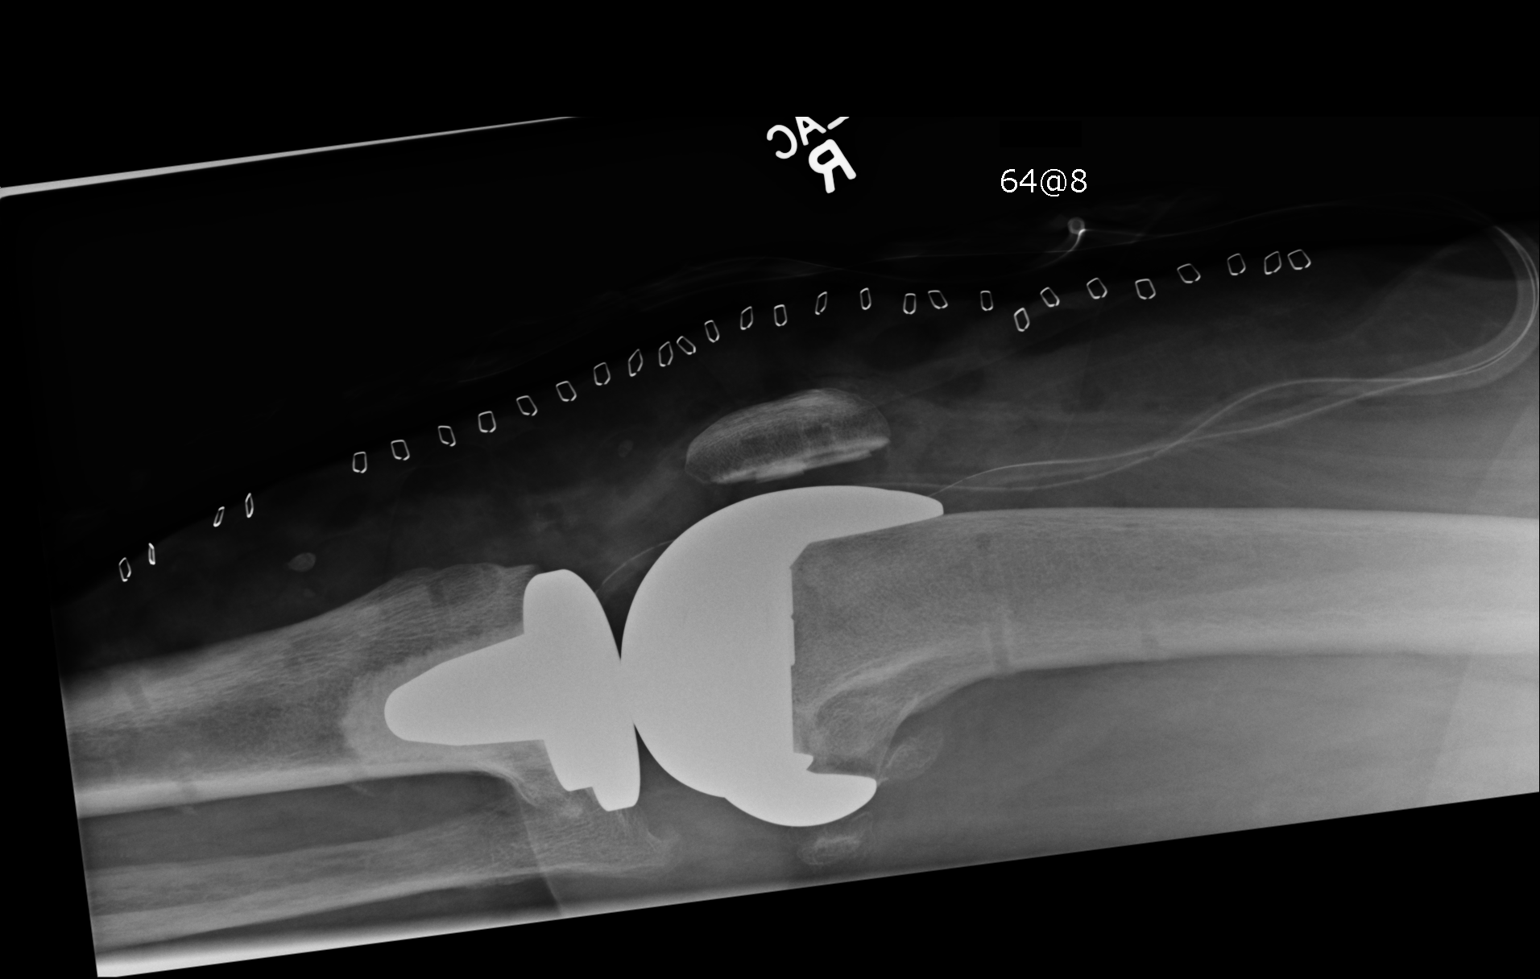

[2 of 2 positions shown; findings below may reference images not displayed]

FINDINGS: The patient is status post total right knee replacement. The
hardware appears intact without evidence of loosening or failure. The native
osseous structures are unremarkable. Skin staples and surgical drains are
appreciated about the right knee.
IMPRESSION: 1. The patient is status post total right knee replacement. The remainder of
the interpretation will be left to the performing physician.

## 2012-07-27 LAB — BASIC METABOLIC PANEL
Anion Gap: 6 — ABNORMAL LOW (ref 7–16)
BUN: 22 mg/dL — ABNORMAL HIGH (ref 7–18)
Calcium, Total: 9.2 mg/dL (ref 8.5–10.1)
Chloride: 108 mmol/L — ABNORMAL HIGH (ref 98–107)
Co2: 26 mmol/L (ref 21–32)
EGFR (African American): 52 — ABNORMAL LOW
EGFR (Non-African Amer.): 45 — ABNORMAL LOW
Glucose: 122 mg/dL — ABNORMAL HIGH (ref 65–99)
Potassium: 3.2 mmol/L — ABNORMAL LOW (ref 3.5–5.1)
Sodium: 140 mmol/L (ref 136–145)

## 2012-07-27 LAB — PLATELET COUNT: Platelet: 230 10*3/uL (ref 150–440)

## 2012-07-27 LAB — HEMOGLOBIN: HGB: 11.4 g/dL — ABNORMAL LOW (ref 12.0–16.0)

## 2012-07-28 LAB — HEMOGLOBIN: HGB: 10.8 g/dL — ABNORMAL LOW (ref 12.0–16.0)

## 2012-07-28 LAB — PLATELET COUNT: Platelet: 213 10*3/uL (ref 150–440)

## 2012-07-28 LAB — BASIC METABOLIC PANEL
Anion Gap: 8 (ref 7–16)
BUN: 16 mg/dL (ref 7–18)
Chloride: 106 mmol/L (ref 98–107)
EGFR (African American): 54 — ABNORMAL LOW
Glucose: 90 mg/dL (ref 65–99)

## 2013-03-14 ENCOUNTER — Ambulatory Visit: Payer: Self-pay | Admitting: Internal Medicine

## 2013-08-07 ENCOUNTER — Ambulatory Visit: Payer: Self-pay

## 2013-08-07 IMAGING — CT NM PARTHYROID
1 series · 12 of 14 positions shown, 15 images · non-contrast
Comparison: [DATE]

CLINICAL DATA: Hyperparathyroidism.

EXAM:
NM PARATHYROID SCINTIGRAPHY AND SPECT IMAGING
TECHNIQUE: Following intravenous administration of radiopharmaceutical, early
and 3-hour delayed planar images were obtained in the anterior
projection. Delayed triplanar SPECT images were also obtained at 2
hours.
RADIOPHARMACEUTICALS:  26.0 [IB] Sestamibi IV

[Series 3: 3d parathroid 1.25 b31s · axial · 0.98mm/px · z∈[+1574,+1778]mm · 12 of 303 slices shown, 15 images]
[im 24/303  soft-tissue]
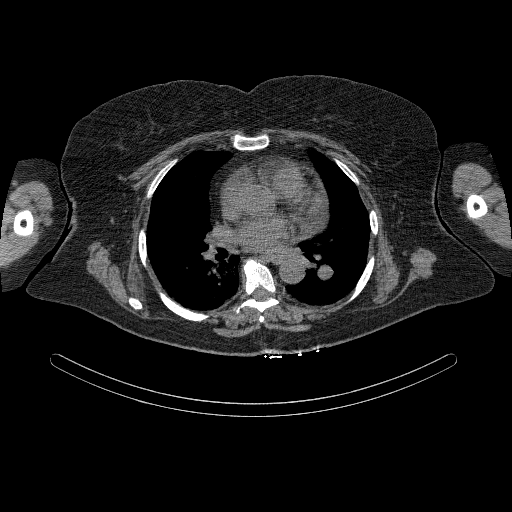
[im 24/303  bone]
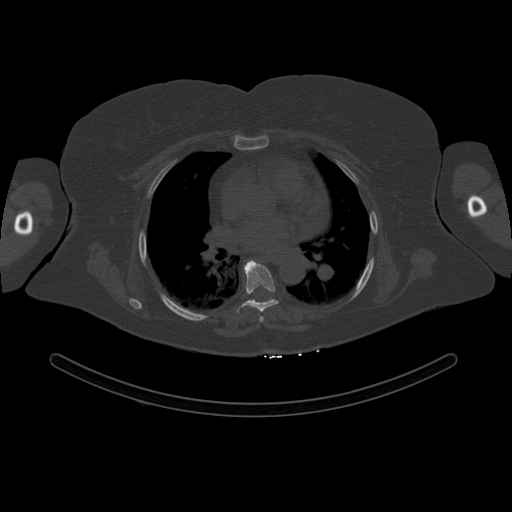
[im 47/303  bone]
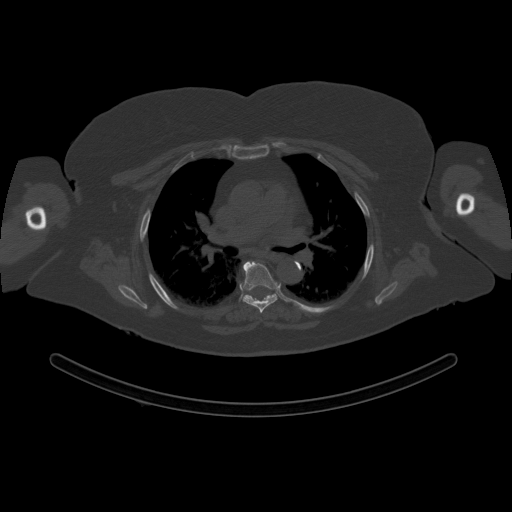
[im 70/303  bone]
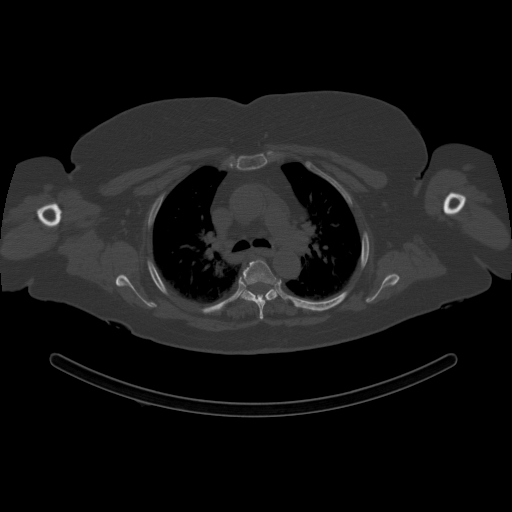
[im 93/303  bone]
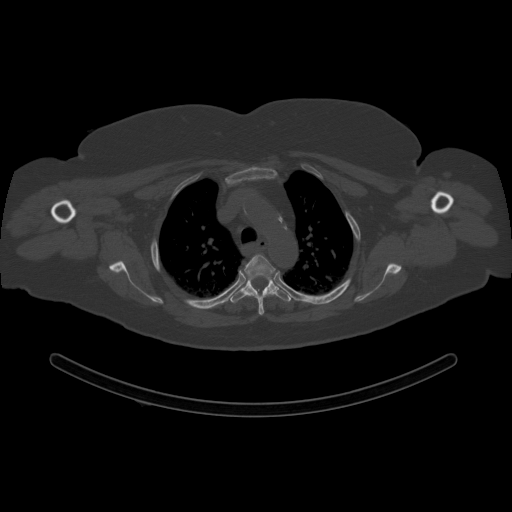
[im 117/303  soft-tissue]
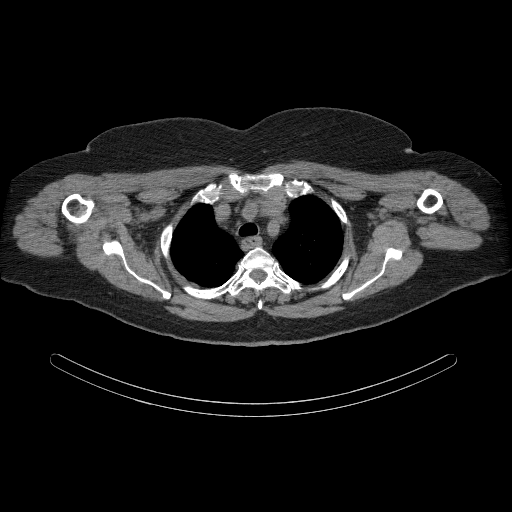
[im 117/303  bone]
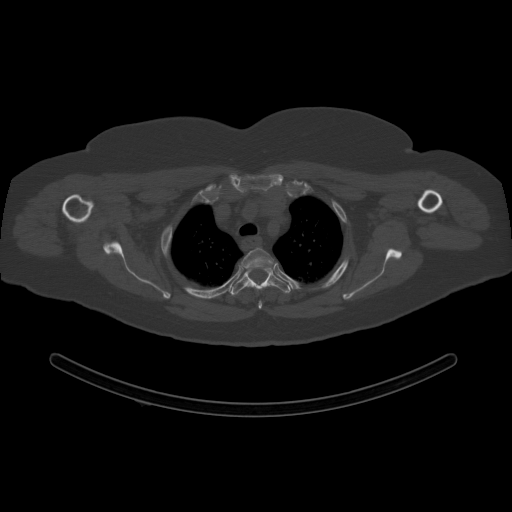
[im 140/303  bone]
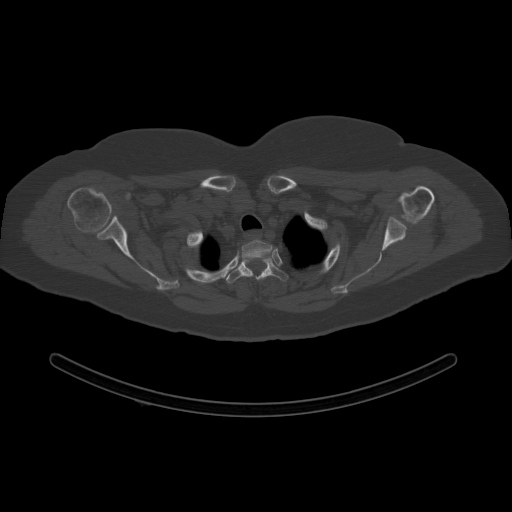
[im 163/303  bone]
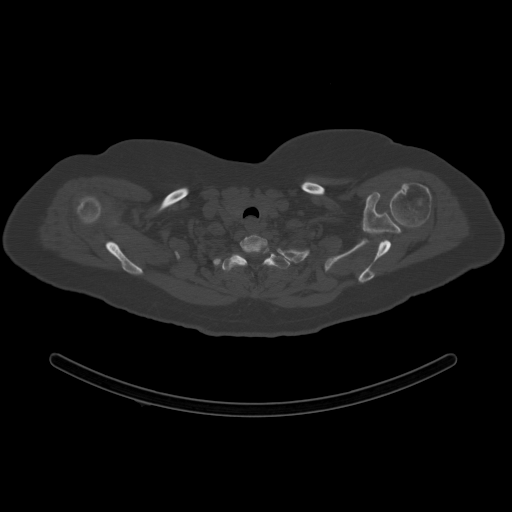
[im 186/303  bone]
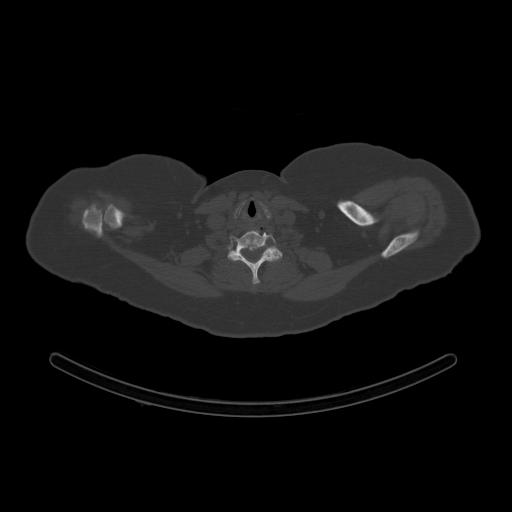
[im 210/303  soft-tissue]
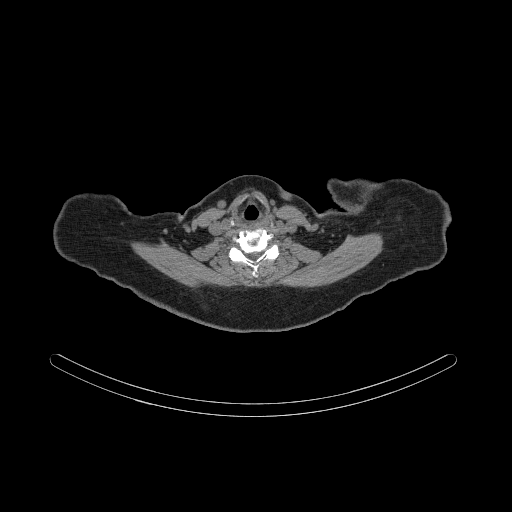
[im 210/303  bone]
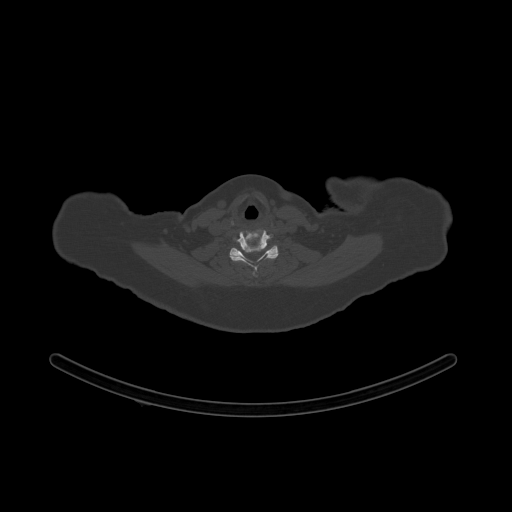
[im 233/303  bone]
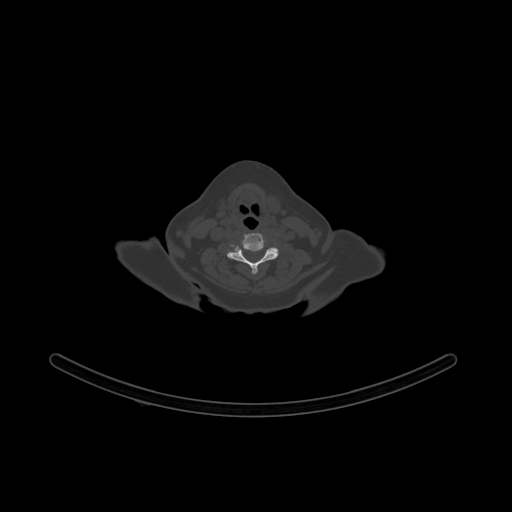
[im 256/303  bone]
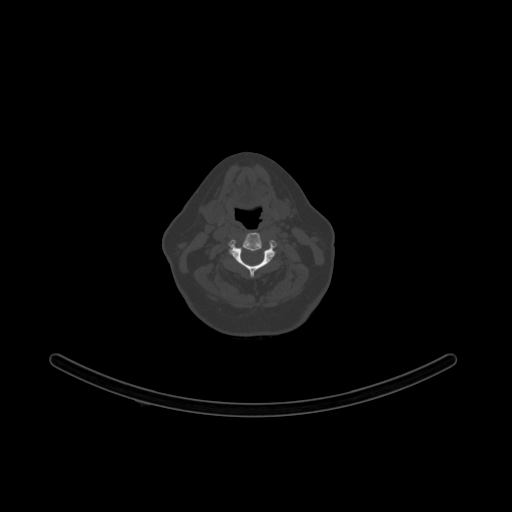
[im 279/303  bone]
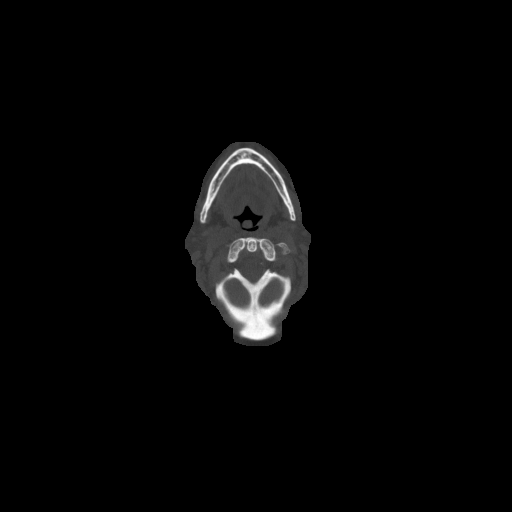

[12 of 14 positions shown; findings below may reference images not displayed]

FINDINGS: On the early images there is uniform tracer uptake by both lobes of
the thyroid gland. On the delayed images there is relative washout
from both lobes. No persistent focus of increased uptake is
identified to suggest parathyroid adenoma.
IMPRESSION: Examination is negative for parathyroid adenoma.

## 2013-08-23 DIAGNOSIS — E041 Nontoxic single thyroid nodule: Secondary | ICD-10-CM | POA: Insufficient documentation

## 2013-08-23 DIAGNOSIS — N289 Disorder of kidney and ureter, unspecified: Secondary | ICD-10-CM | POA: Insufficient documentation

## 2013-08-23 DIAGNOSIS — I10 Essential (primary) hypertension: Secondary | ICD-10-CM | POA: Insufficient documentation

## 2013-12-05 DIAGNOSIS — M1A3720 Chronic gout due to renal impairment, left ankle and foot, without tophus (tophi): Secondary | ICD-10-CM | POA: Insufficient documentation

## 2014-02-05 DIAGNOSIS — E041 Nontoxic single thyroid nodule: Secondary | ICD-10-CM | POA: Diagnosis not present

## 2014-02-11 DIAGNOSIS — E041 Nontoxic single thyroid nodule: Secondary | ICD-10-CM | POA: Diagnosis not present

## 2014-02-25 DIAGNOSIS — I10 Essential (primary) hypertension: Secondary | ICD-10-CM | POA: Diagnosis not present

## 2014-02-25 DIAGNOSIS — J40 Bronchitis, not specified as acute or chronic: Secondary | ICD-10-CM | POA: Diagnosis not present

## 2014-04-01 DIAGNOSIS — I10 Essential (primary) hypertension: Secondary | ICD-10-CM | POA: Diagnosis not present

## 2014-04-01 DIAGNOSIS — N183 Chronic kidney disease, stage 3 (moderate): Secondary | ICD-10-CM | POA: Diagnosis not present

## 2014-04-01 DIAGNOSIS — M1A3720 Chronic gout due to renal impairment, left ankle and foot, without tophus (tophi): Secondary | ICD-10-CM | POA: Diagnosis not present

## 2014-04-01 DIAGNOSIS — E780 Pure hypercholesterolemia: Secondary | ICD-10-CM | POA: Diagnosis not present

## 2014-05-09 ENCOUNTER — Ambulatory Visit
Admit: 2014-05-09 | Disposition: A | Discharge status: Home / Self Care | Payer: Self-pay | Attending: Internal Medicine | Admitting: Internal Medicine

## 2014-05-09 DIAGNOSIS — Z1231 Encounter for screening mammogram for malignant neoplasm of breast: Secondary | ICD-10-CM | POA: Diagnosis not present

## 2014-05-17 DIAGNOSIS — N183 Chronic kidney disease, stage 3 (moderate): Secondary | ICD-10-CM | POA: Diagnosis not present

## 2014-05-17 DIAGNOSIS — I10 Essential (primary) hypertension: Secondary | ICD-10-CM | POA: Diagnosis not present

## 2014-05-17 DIAGNOSIS — R809 Proteinuria, unspecified: Secondary | ICD-10-CM | POA: Diagnosis not present

## 2014-05-24 NOTE — Discharge Summary (Signed)
PATIENT NAME:  Laura Meyer, RUCKMAN MR#:  [id number] DATE OF BIRTH:  October 27, 1940  DATE OF ADMISSION:  07/26/2012 DATE OF DISCHARGE:  07/29/2012   DICTATING FOR: Jeneen Rinks P. Holley Bouche., MD  ADMITTING DIAGNOSIS: Degenerative arthrosis of the right knee.   DISCHARGE DIAGNOSIS: Degenerative arthrosis of the right knee.    Wrong pt.    ____________________________ Vance Peper, PA jrw:OSi D: 07/28/2012 08:16:05 ET T: 07/28/2012 09:24:06 ET JOB#: [id number]  cc: Vance Peper, PA, <Dictator> JON WOLFE PA ELECTRONICALLY SIGNED 07/28/2012 21:44

## 2014-05-24 NOTE — Op Note (Signed)
PATIENT NAME:  Laura Meyer, Laura Meyer MR#:  [id number] DATE OF BIRTH:  02/28/1940  DATE OF PROCEDURE:  07/26/2012  PREOPERATIVE DIAGNOSIS: Degenerative arthrosis of the right knee.   POSTOPERATIVE DIAGNOSIS: Degenerative arthrosis of the right knee.   PROCEDURE PERFORMED: Right total knee arthroplasty using computer-assisted navigation.   SURGEON: Laurice Record. Holley Bouche., MD   ASSISTANT: Vance Peper, PA (required to maintain retraction throughout the procedure)   ANESTHESIA: Femoral nerve block and spinal.   ESTIMATED BLOOD LOSS: 200 mL.   FLUIDS REPLACED: 1500 mL of crystalloid.   TOURNIQUET TIME: 98 minutes.   DRAINS: Two medium drains to reinfusion system.   SOFT TISSUE RELEASES: Anterior cruciate ligament, posterior cruciate ligament, deep medial collateral ligament and patellofemoral ligament.   IMPLANTS UTILIZED: DePuy PFC Sigma size 2.5 posterior stabilized femoral component (cemented), size 2 MBT tibial component (cemented), 35 mm 3-peg oval dome patella (cemented), and a 15 mm stabilized rotating platform polyethylene insert.   INDICATIONS FOR SURGERY: The patient is a 74 year old female who has been seen for complaints of progressive right knee pain. X-rays demonstrated severe degenerative changes in tricompartmental fashion with varus deformity. After discussion of the risks and benefits of surgical intervention, the patient expressed understanding of the risks and benefits and agreed with plans for surgical intervention.   PROCEDURE IN DETAIL: The patient was brought into the operating room, and after adequate femoral nerve block and spinal anesthesia was achieved, a tourniquet was placed on the patient's upper right thigh. The patient's right knee and leg were cleaned and prepped with alcohol and DuraPrep and draped in the usual sterile fashion. A "timeout" was performed as per usual protocol. The right lower extremity was exsanguinated using an Esmarch, and the tourniquet was inflated to  300 mmHg. Anterior and longitudinal incision was made followed by a standard mid vastus approach. A large effusion was evacuated. Deep fibers of the medial collateral ligament were elevated in a subperiosteal fashion off the medial flare of the tibia so as to maintain a continuous soft tissue sleeve. The patella was subluxed laterally, and the patellofemoral ligament was incised. Inspection of the knee demonstrated severe degenerative changes in tricompartmental fashion with full-thickness loss of articular cartilage to the medial compartment and erosive changes to the medial tibial plateau. Osteophytes were debrided using a rongeur. Anterior and posterior cruciate ligaments were excised. Two 4.0 mm Schanz pins were inserted into the femur and into the tibia for attachment of the array of trackers used for computer-assisted navigation. Hip center was identified using a circumduction technique. Distal landmarks were mapped using the computer. The distal femur and proximal tibia were mapped using the computer. Distal femoral cutting guide was positioned using computer-assisted navigation so as to achieve 5 degrees distal valgus cut. Cut was performed and verified using the computer. The distal femur was sized, and it was felt that a size 2.5 femoral component was appropriate. A size 2.5 cutting guide was positioned, and anterior cut was performed and verified using the computer. This was followed by completion of the posterior and chamfer cuts. A femoral cutting guide for the central box was then positioned, and the central box cut was performed. Attention was then directed to the proximal tibia. Medial and lateral menisci were excised. The extramedullary tibial cutting guide was positioned using computer-assisted navigation so as to achieve 0-degree varus-valgus alignment and 0-degree posterior slope. Cut was performed and verified using the computer. The proximal tibia was sized, and it was felt that a size 2 tibial  tray was appropriate. Tibial and femoral trials were inserted, by insertion of first a 10 and subsequently a 12.5 and eventually a 15 mm polyethylene insert. Excellent medial and lateral soft tissue balancing was appreciated both in full extension and in flexion using the 15 mm polyethylene trial. Finally, the patella was cut and prepared so as to accommodate a 35 mm 3-peg oval dome patella. Patellar trial was placed, and the knee was placed through a range of motion with excellent patellar tracking appreciated. The femoral component was removed. A central post hole for the tibial component was reamed followed by insertion of a keel punch. Tibial trials were then removed. The cut surfaces of bone were irrigated with copious amounts of normal saline with antibiotic solution using pulsatile lavage and then suctioned dry. Polymethyl methacrylate cement with gentamicin was prepared in the usual fashion using a vacuum mixer. Cement was applied to the cut surface of the proximal tibia as well as along the undersurface of a size 2 MBT tibial component. The tibial component was positioned and impacted into place. Excess cement removed using freer elevators. Cement was then applied to the cut surface of the femur as well as along the posterior flanges of a size 2.5 posterior stabilized femoral component. The femoral component was positioned and impacted into place. Excess cement was removed using freer elevators. A 15 mm polyethylene trial was inserted, and the knee was brought in full extension with steady axial compression applied. Finally, cement was applied to the backside of a 35 mm 3-peg oval dome patella, and patellar component was positioned and patellar clamp applied. Excess cement was removed using freer elevators.   After adequate curing of cement, the tourniquet was deflated after a total tourniquet time of 98 minutes. Hemostasis was achieved using electrocautery. The knee was irrigated with copious amounts of  normal saline with antibiotic solution using pulsatile lavage and then suctioned dry. The knee was inspected for any residual cement debris; 20 mL of 1.3% Exparel in 40 mL of injectable normal saline was injected along the posterior capsule as well as along the medial and lateral recesses. Exparel was also injected along the arthrotomy site as well as along the synovium and periosteum. A 15 mm stabilized rotating platform polyethylene insert was inserted, and the knee was placed through a range of motion. Excellent patellar tracking was appreciated and excellent medial and lateral soft tissue balancing was noted both in flexion and in extension. Two medium drains were placed in the wound bed and brought out through a separate stab incision to be attached to a reinfusion system. The medial parapatellar portion of the incision was reapproximated using interrupted sutures of #1 Vicryl. A total of 30 mL of 0.25% Marcaine with epinephrine was injected along the subcutaneous tissue. The subcutaneous tissue was then approximated in layers using first #0 Vicryl followed by 2-0 Vicryl. Skin was closed skin staples. A sterile dressing was applied.   The patient tolerated the procedure well. She was transported to the recovery room in stable condition.   ____________________________ Laurice Record. Holley Bouche., MD jph:cb D: 07/26/2012 16:17:54 ET T: 07/26/2012 21:29:24 ET JOB#: [id number]  cc: Laurice Record. Holley Bouche., MD, <Dictator> JAMES P Holley Bouche MD ELECTRONICALLY SIGNED 07/27/2012 23:13

## 2014-05-24 NOTE — Discharge Summary (Signed)
PATIENT NAME:  ABISHAI, POSS MR#:  [id number] DATE OF BIRTH:  1940-08-08  DATE OF ADMISSION:  07/26/2012 DATE OF DISCHARGE:  07/29/2012  ADMITTING DIAGNOSIS: Degenerative arthrosis of the right knee.   DISCHARGE DIAGNOSIS: Degenerative arthrosis of the right knee.   HISTORY OF PRESENT ILLNESS: The patient is a 74 year old female who has been followed at [facility] for progression of right knee pain. She had reported a several year history of progressive right knee pain. She had localized most of the pain along the medial aspect of the knee. Her pain was aggravated with weight-bearing activities and prolonged ambulation. She reported some occasional swelling of the knee as well as near giving way of the knee. She denied any gross locking of the knee. She had not seen any significant improvement in her condition despite Tylenol, intra-articular cortisone injections, as well as activity modification. The patient states that the pain had progressed to the point that it was significantly interfering with her activities of daily living. X-rays taken in [place] showed narrowing of the medial cartilage space with bone-on-bone articulation noted as well as being associated with varus alignment.  Pseudosubluxation was noted. Osteophyte formation as well as subchondral sclerosis was noted. After discussion of the risks and benefits of surgical intervention, the patient expressed her understanding of the risks and benefits and agreed for plans for surgical intervention.   PROCEDURE: Right total knee arthroplasty using computer-assisted navigation.   ANESTHESIA: Femoral nerve block with spinal.   SOFT TISSUE RELEASES: Anterior cruciate ligament, posterior cruciate ligament, deep medial collateral ligaments, as well as patellofemoral ligament.   IMPLANTS UTILIZED: DePuy PFC Sigma size  2.5 posterior stabilized femoral component (cemented), size 2 MBT tibial component (cemented), 35 mm three  pegged oval dome patella (cemented) and a 15 mm stabilized rotating platform polyethylene insert.   HOSPITAL COURSE: The patient tolerated the procedure very well. She had no complications. She was then taken to the PAC-U where she was stabilized and then transferred to the orthopedic floor. She began receiving anticoagulation therapy of Lovenox 30 mg subcu q. 12 hours per anesthesia and pharmacy protocol. She was fitted with TED stockings bilaterally. These were allowed to be removed 1 hour per 8 hour shift. The right one was applied on day 2 following removal of the Hemovac and dressing change. The patient was also fitted with the AV-I compression foot pumps bilaterally set at 80 mmHg.  Her calves have been nontender. She has been free of any evidence of any DVTs.  Negative Homans sign. Calves have been nontender. Heels were elevated off the bed using rolled towels.   The patient has denied any chest pain or shortness of breath. Vital signs have been stable. She has been afebrile. Hemodynamically she has been stable and no transfusions were needed. She was given Autovac transfusions the first 6 hours postoperatively.   Physical therapy was initiated on day 1 for gait training and transfers. She has done very well. Upon being discharged was ambulating greater than 200 feet. She was independent with bed to chair transfers. She was able go up and down 4 steps. Occupational therapy was also initiated on day 1 for ADLs and assistive devices. She has progressed very nicely with therapy.   The patient's IV, Foley and Hemovac were DC'd on day 2 along with a dressing change. The Polar Care was reapplied to the surgical leg maintaining a temperature of 40 to 50 degrees Fahrenheit.   DISPOSITION: The patient is being discharged  to home in improved stable condition.   DISCHARGE INSTRUCTIONS: She will continue with weight bearing as tolerated. Continue using a walker until cleared by physical therapy to go to a  quad cane. She will receive home health PT. Continue with TED stockings. These are to be worn during the day but may be removed at night. Continue Polar Care maintaining a temperature of 40 to 50 degrees Fahrenheit to the surgical leg.  She is placed on a regular diet. She is to resume her regular medication that she was on prior to admission. She has a follow-up appointment with [facility] orthopedics on July the 26th. She is to call the clinic sooner if any temperatures of 101.5 or greater or excessive bleeding. She was given a prescription for Lovenox 40 mg subcu q. day for 14 days and then discontinue and begin taking one 81 mg enteric-coated aspirin. Also a prescription for oxycodone 5 to 10 mg q. 4 to 6 hours p.r.n. for pain, tramadol 50 to 100 mg q. 4 to 6 hours p.r.n. for pain.   PAST MEDICAL HISTORY: 1.  Hypertension. 2.  Gout. 3.  Kidney failure.  4.  Hypercholesterolemia.  5.  Arthritis.  6.  Decreased kidney function.  ____________________________ Vance Peper, PA jrw:sb D: 07/28/2012 08:27:00 ET T: 07/28/2012 09:51:38 ET JOB#: [id number]  cc: Vance Peper, PA, <Dictator> Elisabel Hanover PA ELECTRONICALLY SIGNED 07/28/2012 21:43

## 2014-07-03 DIAGNOSIS — H2513 Age-related nuclear cataract, bilateral: Secondary | ICD-10-CM | POA: Diagnosis not present

## 2014-09-30 DIAGNOSIS — I10 Essential (primary) hypertension: Secondary | ICD-10-CM | POA: Diagnosis not present

## 2014-09-30 DIAGNOSIS — Z0000 Encounter for general adult medical examination without abnormal findings: Secondary | ICD-10-CM | POA: Diagnosis not present

## 2014-09-30 DIAGNOSIS — M1A3720 Chronic gout due to renal impairment, left ankle and foot, without tophus (tophi): Secondary | ICD-10-CM | POA: Diagnosis not present

## 2014-09-30 DIAGNOSIS — Z6841 Body Mass Index (BMI) 40.0 and over, adult: Secondary | ICD-10-CM | POA: Diagnosis not present

## 2014-10-08 DIAGNOSIS — I10 Essential (primary) hypertension: Secondary | ICD-10-CM | POA: Diagnosis not present

## 2014-10-08 DIAGNOSIS — E7800 Pure hypercholesterolemia, unspecified: Secondary | ICD-10-CM | POA: Insufficient documentation

## 2014-10-09 ENCOUNTER — Other Ambulatory Visit: Payer: Self-pay | Admitting: Specialist

## 2014-10-17 ENCOUNTER — Ambulatory Visit
Admission: RE | Admit: 2014-10-17 | Discharge: 2014-10-17 | Disposition: A | Discharge status: Home / Self Care | Payer: Commercial Managed Care - HMO | Source: Ambulatory Visit | Attending: Specialist | Admitting: Specialist

## 2014-10-17 DIAGNOSIS — Z713 Dietary counseling and surveillance: Secondary | ICD-10-CM | POA: Diagnosis not present

## 2014-10-17 DIAGNOSIS — I10 Essential (primary) hypertension: Secondary | ICD-10-CM | POA: Diagnosis not present

## 2014-10-17 DIAGNOSIS — K8020 Calculus of gallbladder without cholecystitis without obstruction: Secondary | ICD-10-CM | POA: Insufficient documentation

## 2014-10-17 DIAGNOSIS — Z01818 Encounter for other preprocedural examination: Secondary | ICD-10-CM | POA: Diagnosis not present

## 2014-10-17 DIAGNOSIS — E669 Obesity, unspecified: Secondary | ICD-10-CM | POA: Diagnosis not present

## 2014-10-17 DIAGNOSIS — R911 Solitary pulmonary nodule: Secondary | ICD-10-CM | POA: Insufficient documentation

## 2014-10-17 DIAGNOSIS — E780 Pure hypercholesterolemia: Secondary | ICD-10-CM | POA: Diagnosis not present

## 2014-10-17 DIAGNOSIS — K760 Fatty (change of) liver, not elsewhere classified: Secondary | ICD-10-CM | POA: Insufficient documentation

## 2014-10-17 IMAGING — CR DG CHEST 2V
1 series · 2 of 2 positions shown · non-contrast
Comparison: [DATE]

CLINICAL DATA: Preop for bariatric surgery

EXAM:
CHEST  2 VIEW

[Series 1: dg chest 2 view · 0.14mm/px · 2 of 2 slices shown]
[im 1/2]
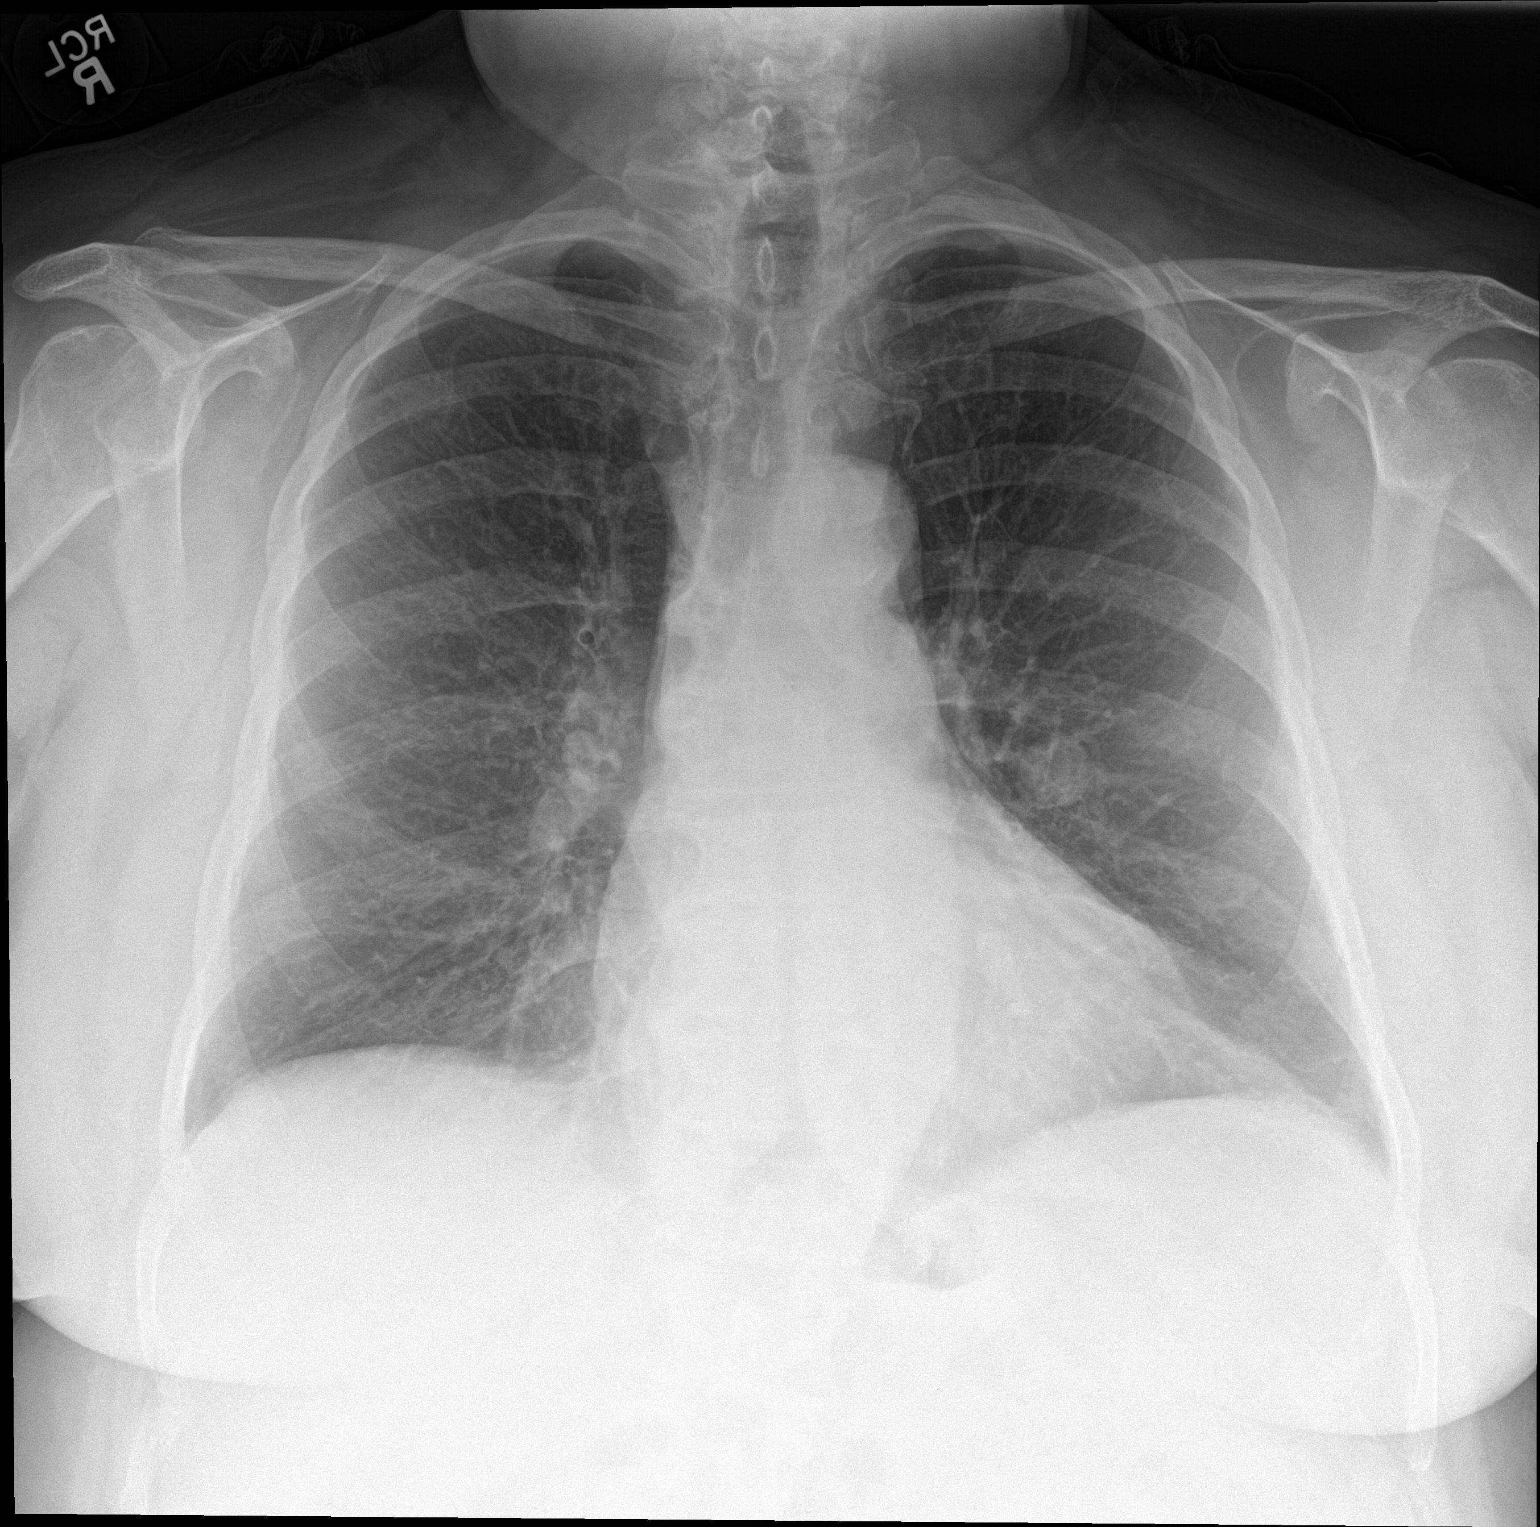
[im 2/2]
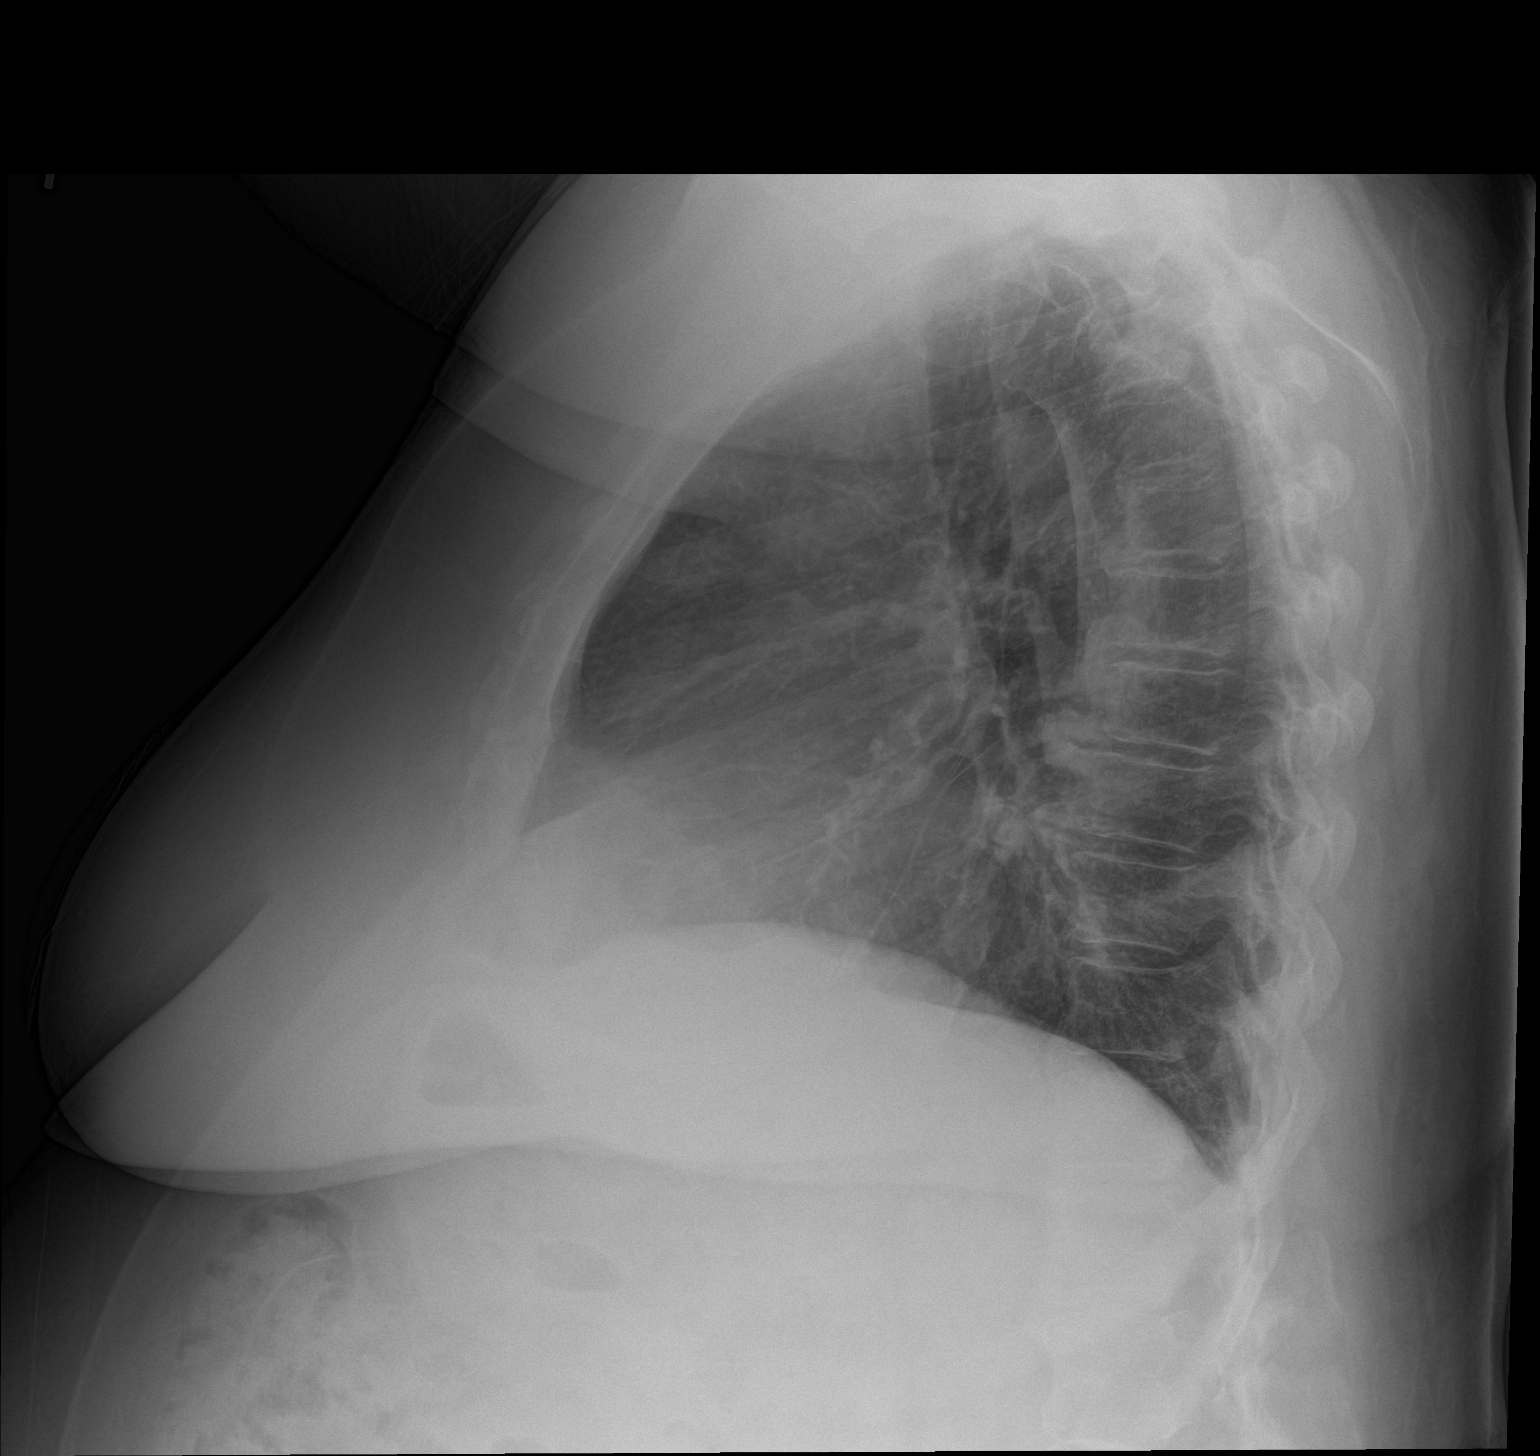

[2 of 2 positions shown; findings below may reference images not displayed]

FINDINGS: The heart size and mediastinal contours are within normal limits.
Within the perihilar left lung there is a left lower lobe lung
nodule measuring 1.6 cm this is been present dating back to [1Y] and
likely represents a benign process. The visualized skeletal
structures are unremarkable.
IMPRESSION: 1. No acute cardiopulmonary abnormalities.
2. Left lower lobe perihilar lung nodule is again noted. This is
favored to represent a benign process.

## 2014-10-21 DIAGNOSIS — I10 Essential (primary) hypertension: Secondary | ICD-10-CM | POA: Diagnosis not present

## 2014-10-21 DIAGNOSIS — N183 Chronic kidney disease, stage 3 (moderate): Secondary | ICD-10-CM | POA: Diagnosis not present

## 2014-10-21 DIAGNOSIS — E780 Pure hypercholesterolemia: Secondary | ICD-10-CM | POA: Diagnosis not present

## 2014-10-21 DIAGNOSIS — Z01818 Encounter for other preprocedural examination: Secondary | ICD-10-CM | POA: Diagnosis not present

## 2014-10-29 DIAGNOSIS — E780 Pure hypercholesterolemia: Secondary | ICD-10-CM | POA: Diagnosis not present

## 2014-10-29 DIAGNOSIS — I10 Essential (primary) hypertension: Secondary | ICD-10-CM | POA: Diagnosis not present

## 2014-10-29 DIAGNOSIS — F4322 Adjustment disorder with anxiety: Secondary | ICD-10-CM | POA: Diagnosis not present

## 2014-10-30 DIAGNOSIS — Z01818 Encounter for other preprocedural examination: Secondary | ICD-10-CM | POA: Diagnosis not present

## 2014-11-07 DIAGNOSIS — F4322 Adjustment disorder with anxiety: Secondary | ICD-10-CM | POA: Diagnosis not present

## 2014-11-11 DIAGNOSIS — I10 Essential (primary) hypertension: Secondary | ICD-10-CM | POA: Diagnosis not present

## 2014-11-11 DIAGNOSIS — R911 Solitary pulmonary nodule: Secondary | ICD-10-CM | POA: Insufficient documentation

## 2014-11-11 DIAGNOSIS — E7800 Pure hypercholesterolemia, unspecified: Secondary | ICD-10-CM | POA: Diagnosis not present

## 2014-11-13 DIAGNOSIS — I10 Essential (primary) hypertension: Secondary | ICD-10-CM | POA: Diagnosis not present

## 2014-11-13 DIAGNOSIS — N183 Chronic kidney disease, stage 3 (moderate): Secondary | ICD-10-CM | POA: Diagnosis not present

## 2014-11-13 DIAGNOSIS — R809 Proteinuria, unspecified: Secondary | ICD-10-CM | POA: Diagnosis not present

## 2015-01-13 DIAGNOSIS — E041 Nontoxic single thyroid nodule: Secondary | ICD-10-CM | POA: Diagnosis not present

## 2015-02-13 DIAGNOSIS — E041 Nontoxic single thyroid nodule: Secondary | ICD-10-CM | POA: Diagnosis not present

## 2015-02-19 DIAGNOSIS — B0230 Zoster ocular disease, unspecified: Secondary | ICD-10-CM | POA: Diagnosis not present

## 2015-02-21 DIAGNOSIS — B0231 Zoster conjunctivitis: Secondary | ICD-10-CM | POA: Diagnosis not present

## 2015-02-28 DIAGNOSIS — B0231 Zoster conjunctivitis: Secondary | ICD-10-CM | POA: Diagnosis not present

## 2015-03-14 DIAGNOSIS — B0231 Zoster conjunctivitis: Secondary | ICD-10-CM | POA: Diagnosis not present

## 2015-03-18 DIAGNOSIS — N183 Chronic kidney disease, stage 3 (moderate): Secondary | ICD-10-CM | POA: Diagnosis not present

## 2015-03-18 DIAGNOSIS — E7800 Pure hypercholesterolemia, unspecified: Secondary | ICD-10-CM | POA: Diagnosis not present

## 2015-03-18 DIAGNOSIS — I10 Essential (primary) hypertension: Secondary | ICD-10-CM | POA: Diagnosis not present

## 2015-03-18 DIAGNOSIS — Z6841 Body Mass Index (BMI) 40.0 and over, adult: Secondary | ICD-10-CM | POA: Diagnosis not present

## 2015-03-18 DIAGNOSIS — M1A3720 Chronic gout due to renal impairment, left ankle and foot, without tophus (tophi): Secondary | ICD-10-CM | POA: Diagnosis not present

## 2015-05-19 DIAGNOSIS — R809 Proteinuria, unspecified: Secondary | ICD-10-CM | POA: Diagnosis not present

## 2015-05-19 DIAGNOSIS — M109 Gout, unspecified: Secondary | ICD-10-CM | POA: Diagnosis not present

## 2015-05-19 DIAGNOSIS — I10 Essential (primary) hypertension: Secondary | ICD-10-CM | POA: Diagnosis not present

## 2015-05-19 DIAGNOSIS — N183 Chronic kidney disease, stage 3 (moderate): Secondary | ICD-10-CM | POA: Diagnosis not present

## 2015-06-23 IMAGING — US US ABDOMEN LIMITED
1 series · 14 of 25 positions shown · non-contrast
Comparison: [DATE].

CLINICAL DATA: Morbid obesity.

EXAM:
US ABDOMEN LIMITED - RIGHT UPPER QUADRANT

[Series 1: us abdomen limited · 0.28mm/px · 14 of 50 slices shown]
[im 1/50]
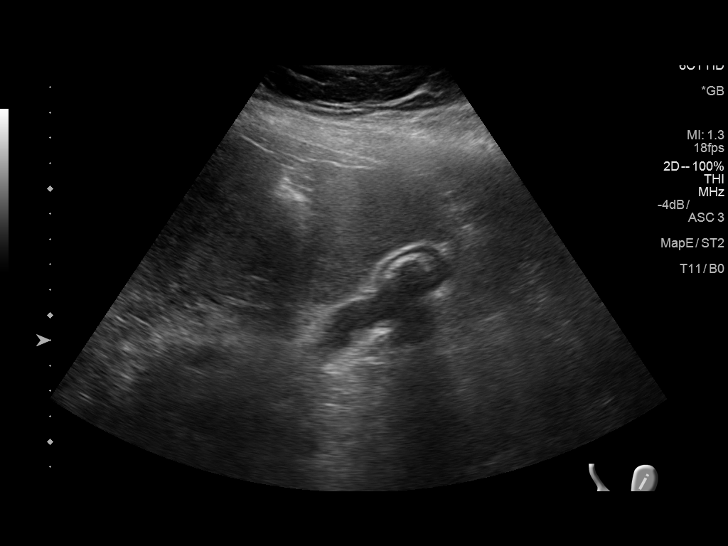
[im 5/50]
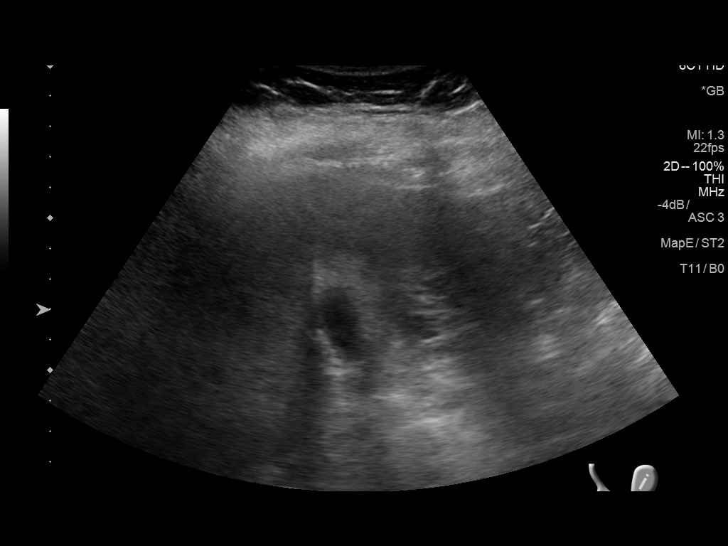
[im 9/50]
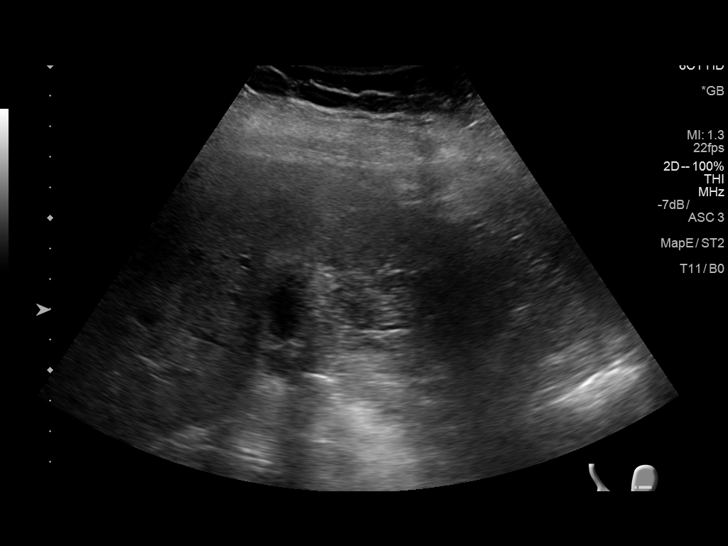
[im 13/50]
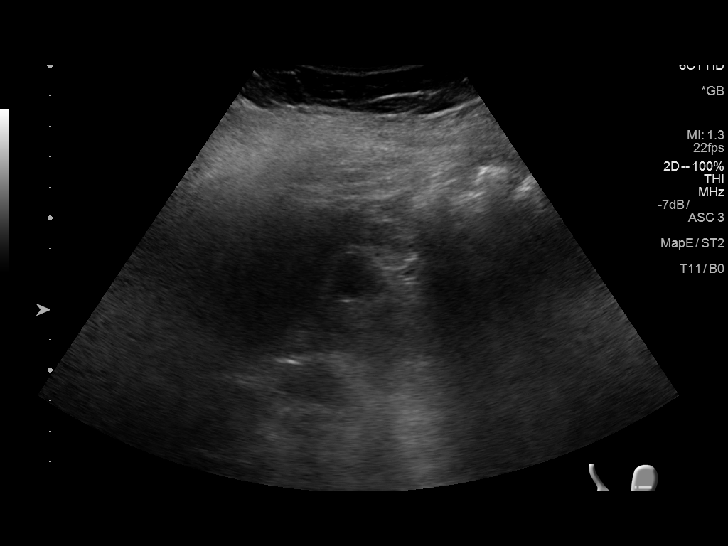
[im 17/50]
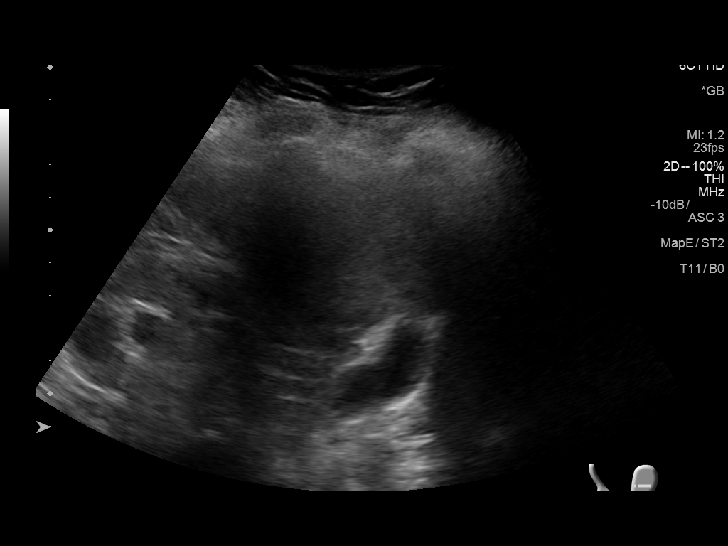
[im 19/50]
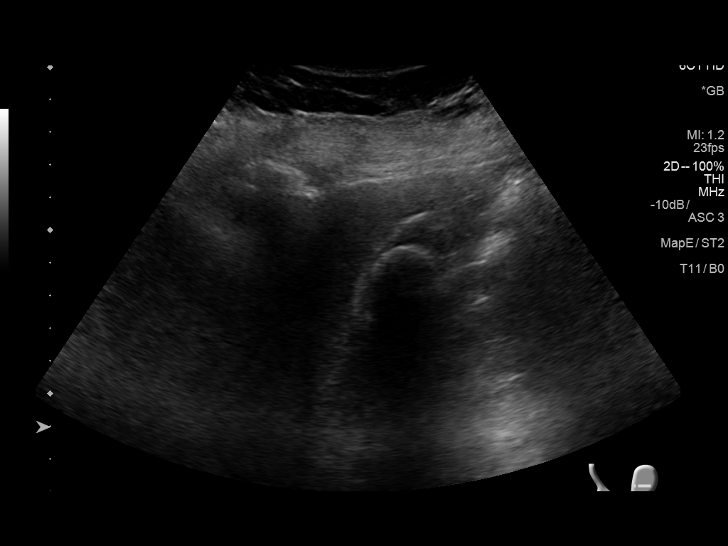
[im 23/50]
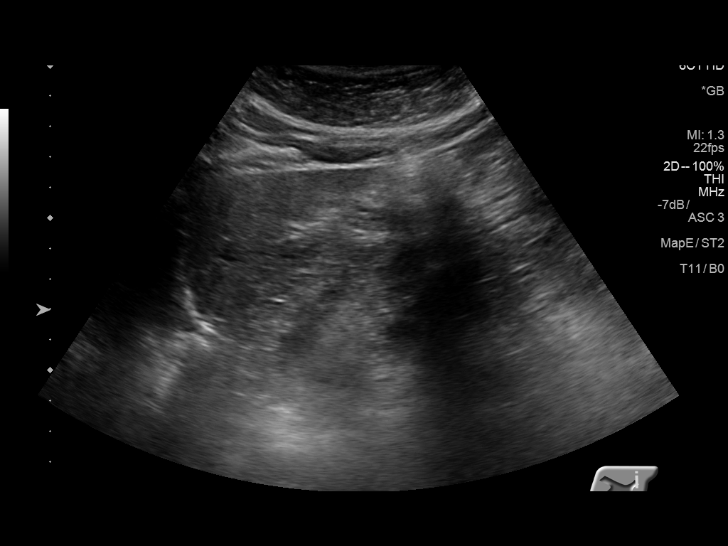
[im 27/50]
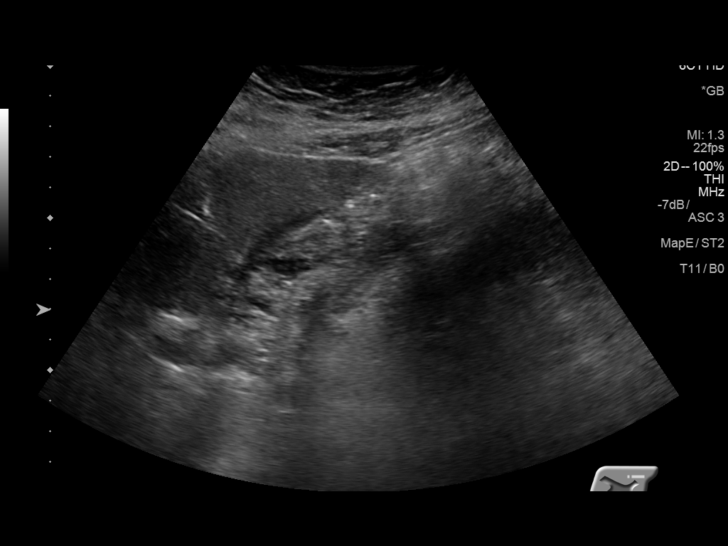
[im 31/50]
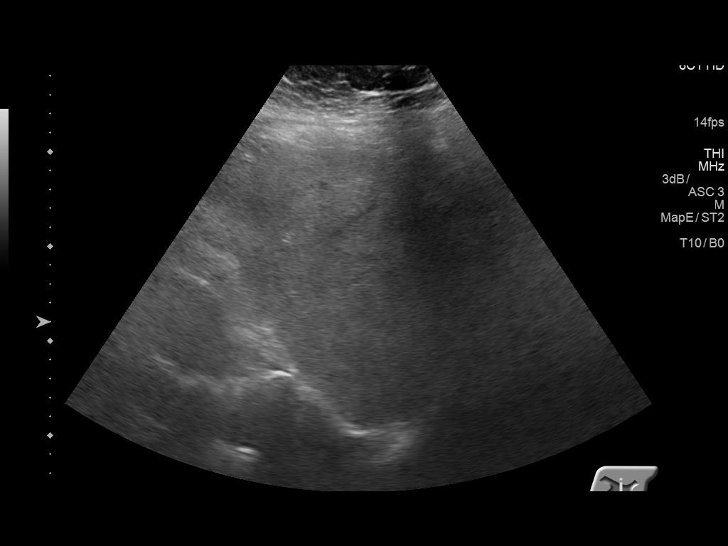
[im 33/50]
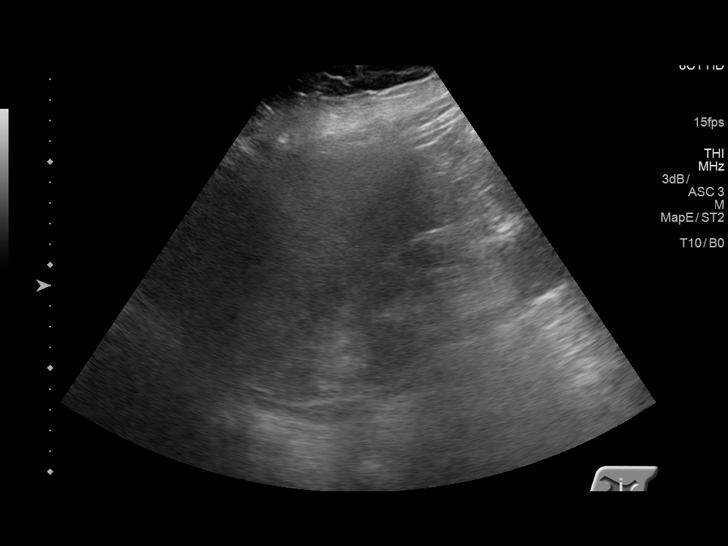
[im 37/50]
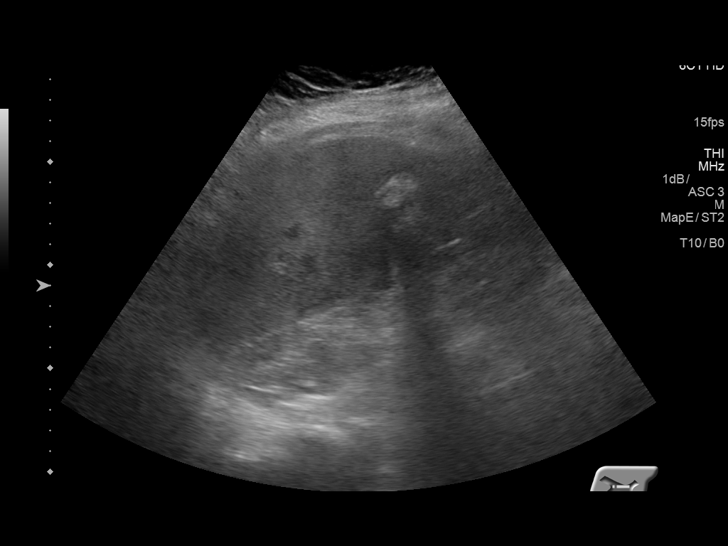
[im 41/50]
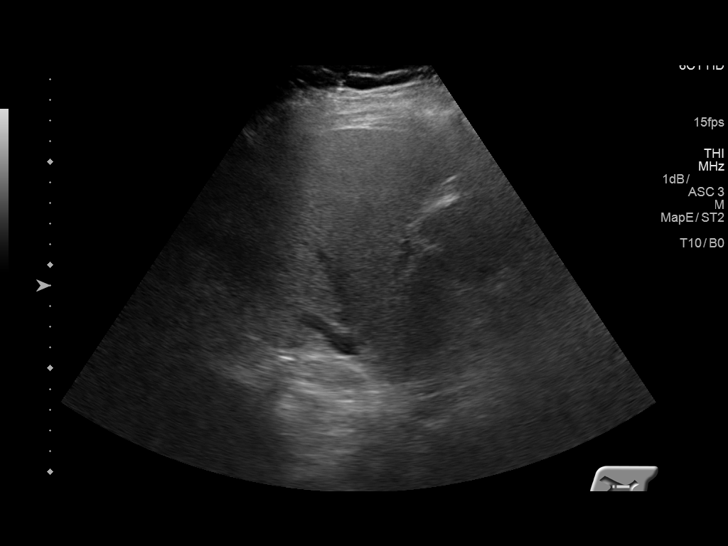
[im 45/50]
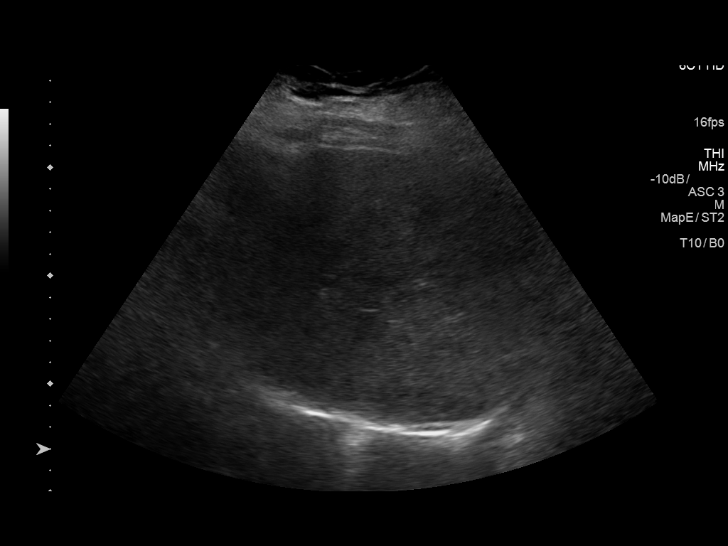
[im 50/50]
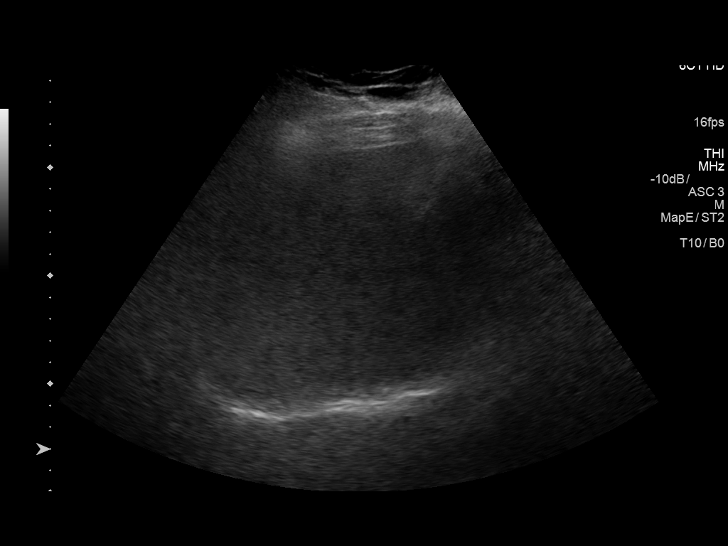

[14 of 25 positions shown; findings below may reference images not displayed]

FINDINGS: Gallbladder:

2.1 cm gallstone noted. Gallbladder wall thickness normal at 2 mm.
Negative Murphy sign. Gallbladder is contracted.

Common bile duct:

Diameter: 4.1 mm

Liver:

Liver is echogenic consistent fatty infiltration and/or
hepatocellular disease. Similar findings noted on prior study .
IMPRESSION: 1. 2.1 cm gallstone.
2. Echodense liver consistent with fatty infiltration and/or
hepatocellular disease.

## 2015-10-09 DIAGNOSIS — B028 Zoster with other complications: Secondary | ICD-10-CM | POA: Diagnosis not present

## 2015-11-04 DIAGNOSIS — R202 Paresthesia of skin: Secondary | ICD-10-CM | POA: Diagnosis not present

## 2015-11-04 DIAGNOSIS — M1A3720 Chronic gout due to renal impairment, left ankle and foot, without tophus (tophi): Secondary | ICD-10-CM | POA: Diagnosis not present

## 2015-11-04 DIAGNOSIS — R5382 Chronic fatigue, unspecified: Secondary | ICD-10-CM | POA: Diagnosis not present

## 2015-11-04 DIAGNOSIS — R7303 Prediabetes: Secondary | ICD-10-CM | POA: Insufficient documentation

## 2015-11-04 DIAGNOSIS — N183 Chronic kidney disease, stage 3 (moderate): Secondary | ICD-10-CM | POA: Diagnosis not present

## 2015-11-04 DIAGNOSIS — I10 Essential (primary) hypertension: Secondary | ICD-10-CM | POA: Diagnosis not present

## 2015-11-06 DIAGNOSIS — I10 Essential (primary) hypertension: Secondary | ICD-10-CM | POA: Diagnosis not present

## 2015-11-06 DIAGNOSIS — R809 Proteinuria, unspecified: Secondary | ICD-10-CM | POA: Diagnosis not present

## 2015-11-06 DIAGNOSIS — N183 Chronic kidney disease, stage 3 (moderate): Secondary | ICD-10-CM | POA: Diagnosis not present

## 2015-11-11 DIAGNOSIS — E042 Nontoxic multinodular goiter: Secondary | ICD-10-CM | POA: Diagnosis not present

## 2015-11-18 DIAGNOSIS — E042 Nontoxic multinodular goiter: Secondary | ICD-10-CM | POA: Diagnosis not present

## 2015-11-18 DIAGNOSIS — R809 Proteinuria, unspecified: Secondary | ICD-10-CM | POA: Diagnosis not present

## 2015-11-18 DIAGNOSIS — N183 Chronic kidney disease, stage 3 (moderate): Secondary | ICD-10-CM | POA: Diagnosis not present

## 2015-11-18 DIAGNOSIS — I10 Essential (primary) hypertension: Secondary | ICD-10-CM | POA: Diagnosis not present

## 2016-03-10 DIAGNOSIS — N183 Chronic kidney disease, stage 3 (moderate): Secondary | ICD-10-CM | POA: Diagnosis not present

## 2016-03-10 DIAGNOSIS — M1A3720 Chronic gout due to renal impairment, left ankle and foot, without tophus (tophi): Secondary | ICD-10-CM | POA: Diagnosis not present

## 2016-03-10 DIAGNOSIS — I10 Essential (primary) hypertension: Secondary | ICD-10-CM | POA: Diagnosis not present

## 2016-03-10 DIAGNOSIS — E559 Vitamin D deficiency, unspecified: Secondary | ICD-10-CM | POA: Diagnosis not present

## 2016-03-10 DIAGNOSIS — R7303 Prediabetes: Secondary | ICD-10-CM | POA: Diagnosis not present

## 2016-05-11 DIAGNOSIS — E210 Primary hyperparathyroidism: Secondary | ICD-10-CM | POA: Diagnosis not present

## 2016-05-11 DIAGNOSIS — N183 Chronic kidney disease, stage 3 (moderate): Secondary | ICD-10-CM | POA: Diagnosis not present

## 2016-05-11 DIAGNOSIS — R809 Proteinuria, unspecified: Secondary | ICD-10-CM | POA: Diagnosis not present

## 2016-05-11 DIAGNOSIS — I10 Essential (primary) hypertension: Secondary | ICD-10-CM | POA: Diagnosis not present

## 2016-06-24 DIAGNOSIS — H2513 Age-related nuclear cataract, bilateral: Secondary | ICD-10-CM | POA: Diagnosis not present

## 2016-07-06 DIAGNOSIS — I10 Essential (primary) hypertension: Secondary | ICD-10-CM | POA: Diagnosis not present

## 2016-07-06 DIAGNOSIS — N183 Chronic kidney disease, stage 3 (moderate): Secondary | ICD-10-CM | POA: Diagnosis not present

## 2016-07-06 DIAGNOSIS — E559 Vitamin D deficiency, unspecified: Secondary | ICD-10-CM | POA: Diagnosis not present

## 2016-07-06 DIAGNOSIS — R7303 Prediabetes: Secondary | ICD-10-CM | POA: Diagnosis not present

## 2016-07-06 DIAGNOSIS — R42 Dizziness and giddiness: Secondary | ICD-10-CM | POA: Diagnosis not present

## 2016-07-07 ENCOUNTER — Other Ambulatory Visit: Payer: Self-pay | Admitting: Internal Medicine

## 2016-07-07 DIAGNOSIS — N184 Chronic kidney disease, stage 4 (severe): Secondary | ICD-10-CM

## 2016-07-13 ENCOUNTER — Ambulatory Visit: Payer: Commercial Managed Care - HMO

## 2016-07-27 DIAGNOSIS — I6523 Occlusion and stenosis of bilateral carotid arteries: Secondary | ICD-10-CM | POA: Diagnosis not present

## 2016-07-27 DIAGNOSIS — R42 Dizziness and giddiness: Secondary | ICD-10-CM | POA: Diagnosis not present

## 2016-08-16 DIAGNOSIS — I10 Essential (primary) hypertension: Secondary | ICD-10-CM | POA: Diagnosis not present

## 2016-08-16 DIAGNOSIS — E210 Primary hyperparathyroidism: Secondary | ICD-10-CM | POA: Diagnosis not present

## 2016-08-16 DIAGNOSIS — R809 Proteinuria, unspecified: Secondary | ICD-10-CM | POA: Diagnosis not present

## 2016-08-16 DIAGNOSIS — N183 Chronic kidney disease, stage 3 (moderate): Secondary | ICD-10-CM | POA: Diagnosis not present

## 2016-09-22 DIAGNOSIS — M79642 Pain in left hand: Secondary | ICD-10-CM | POA: Diagnosis not present

## 2016-09-22 DIAGNOSIS — M19041 Primary osteoarthritis, right hand: Secondary | ICD-10-CM | POA: Diagnosis not present

## 2016-09-22 DIAGNOSIS — M1A3720 Chronic gout due to renal impairment, left ankle and foot, without tophus (tophi): Secondary | ICD-10-CM | POA: Diagnosis not present

## 2016-09-22 DIAGNOSIS — M65312 Trigger thumb, left thumb: Secondary | ICD-10-CM | POA: Insufficient documentation

## 2016-09-22 DIAGNOSIS — G8929 Other chronic pain: Secondary | ICD-10-CM | POA: Insufficient documentation

## 2016-09-22 DIAGNOSIS — R202 Paresthesia of skin: Secondary | ICD-10-CM | POA: Diagnosis not present

## 2016-09-22 DIAGNOSIS — M19042 Primary osteoarthritis, left hand: Secondary | ICD-10-CM | POA: Diagnosis not present

## 2016-09-22 DIAGNOSIS — N184 Chronic kidney disease, stage 4 (severe): Secondary | ICD-10-CM | POA: Diagnosis not present

## 2016-09-22 DIAGNOSIS — R200 Anesthesia of skin: Secondary | ICD-10-CM | POA: Diagnosis not present

## 2016-11-16 DIAGNOSIS — R739 Hyperglycemia, unspecified: Secondary | ICD-10-CM | POA: Diagnosis not present

## 2016-11-16 DIAGNOSIS — I10 Essential (primary) hypertension: Secondary | ICD-10-CM | POA: Diagnosis not present

## 2016-11-16 DIAGNOSIS — E210 Primary hyperparathyroidism: Secondary | ICD-10-CM | POA: Diagnosis not present

## 2016-11-16 DIAGNOSIS — N184 Chronic kidney disease, stage 4 (severe): Secondary | ICD-10-CM | POA: Diagnosis not present

## 2017-02-01 DIAGNOSIS — R42 Dizziness and giddiness: Secondary | ICD-10-CM

## 2017-02-01 HISTORY — DX: Dizziness and giddiness: R42

## 2017-02-03 DIAGNOSIS — H2513 Age-related nuclear cataract, bilateral: Secondary | ICD-10-CM | POA: Diagnosis not present

## 2017-03-16 DIAGNOSIS — N184 Chronic kidney disease, stage 4 (severe): Secondary | ICD-10-CM | POA: Diagnosis not present

## 2017-03-16 DIAGNOSIS — R7303 Prediabetes: Secondary | ICD-10-CM | POA: Diagnosis not present

## 2017-03-16 DIAGNOSIS — M1A3720 Chronic gout due to renal impairment, left ankle and foot, without tophus (tophi): Secondary | ICD-10-CM | POA: Diagnosis not present

## 2017-03-16 DIAGNOSIS — I10 Essential (primary) hypertension: Secondary | ICD-10-CM | POA: Diagnosis not present

## 2017-03-16 DIAGNOSIS — E559 Vitamin D deficiency, unspecified: Secondary | ICD-10-CM | POA: Diagnosis not present

## 2017-04-01 DIAGNOSIS — R600 Localized edema: Secondary | ICD-10-CM | POA: Diagnosis not present

## 2017-04-01 DIAGNOSIS — I10 Essential (primary) hypertension: Secondary | ICD-10-CM | POA: Diagnosis not present

## 2017-04-01 DIAGNOSIS — N184 Chronic kidney disease, stage 4 (severe): Secondary | ICD-10-CM | POA: Diagnosis not present

## 2017-09-07 DIAGNOSIS — I10 Essential (primary) hypertension: Secondary | ICD-10-CM | POA: Diagnosis not present

## 2017-09-07 DIAGNOSIS — E559 Vitamin D deficiency, unspecified: Secondary | ICD-10-CM | POA: Diagnosis not present

## 2017-09-07 DIAGNOSIS — M1A3720 Chronic gout due to renal impairment, left ankle and foot, without tophus (tophi): Secondary | ICD-10-CM | POA: Diagnosis not present

## 2017-09-07 DIAGNOSIS — R7303 Prediabetes: Secondary | ICD-10-CM | POA: Diagnosis not present

## 2017-09-14 ENCOUNTER — Other Ambulatory Visit: Payer: Self-pay | Admitting: Internal Medicine

## 2017-09-14 DIAGNOSIS — Z23 Encounter for immunization: Secondary | ICD-10-CM | POA: Diagnosis not present

## 2017-09-14 DIAGNOSIS — Z6841 Body Mass Index (BMI) 40.0 and over, adult: Secondary | ICD-10-CM | POA: Diagnosis not present

## 2017-09-14 DIAGNOSIS — Z0000 Encounter for general adult medical examination without abnormal findings: Secondary | ICD-10-CM | POA: Diagnosis not present

## 2017-09-14 DIAGNOSIS — Z1231 Encounter for screening mammogram for malignant neoplasm of breast: Secondary | ICD-10-CM

## 2017-09-14 DIAGNOSIS — Z780 Asymptomatic menopausal state: Secondary | ICD-10-CM | POA: Diagnosis not present

## 2017-09-14 DIAGNOSIS — N184 Chronic kidney disease, stage 4 (severe): Secondary | ICD-10-CM | POA: Diagnosis not present

## 2017-09-14 DIAGNOSIS — R7303 Prediabetes: Secondary | ICD-10-CM | POA: Diagnosis not present

## 2017-09-14 DIAGNOSIS — Z1239 Encounter for other screening for malignant neoplasm of breast: Secondary | ICD-10-CM | POA: Diagnosis not present

## 2017-10-13 DIAGNOSIS — N184 Chronic kidney disease, stage 4 (severe): Secondary | ICD-10-CM | POA: Diagnosis not present

## 2017-10-13 DIAGNOSIS — E210 Primary hyperparathyroidism: Secondary | ICD-10-CM | POA: Diagnosis not present

## 2017-10-13 DIAGNOSIS — I129 Hypertensive chronic kidney disease with stage 1 through stage 4 chronic kidney disease, or unspecified chronic kidney disease: Secondary | ICD-10-CM | POA: Diagnosis not present

## 2017-10-13 DIAGNOSIS — R809 Proteinuria, unspecified: Secondary | ICD-10-CM | POA: Diagnosis not present

## 2018-02-28 DIAGNOSIS — R2681 Unsteadiness on feet: Secondary | ICD-10-CM | POA: Diagnosis not present

## 2018-03-20 DIAGNOSIS — N184 Chronic kidney disease, stage 4 (severe): Secondary | ICD-10-CM | POA: Diagnosis not present

## 2018-03-20 DIAGNOSIS — R7303 Prediabetes: Secondary | ICD-10-CM | POA: Diagnosis not present

## 2018-04-05 DIAGNOSIS — N184 Chronic kidney disease, stage 4 (severe): Secondary | ICD-10-CM | POA: Diagnosis not present

## 2018-04-05 DIAGNOSIS — I129 Hypertensive chronic kidney disease with stage 1 through stage 4 chronic kidney disease, or unspecified chronic kidney disease: Secondary | ICD-10-CM | POA: Diagnosis not present

## 2018-04-05 DIAGNOSIS — E210 Primary hyperparathyroidism: Secondary | ICD-10-CM | POA: Diagnosis not present

## 2018-04-11 DIAGNOSIS — H1859 Other hereditary corneal dystrophies: Secondary | ICD-10-CM | POA: Diagnosis not present

## 2018-04-11 DIAGNOSIS — H2513 Age-related nuclear cataract, bilateral: Secondary | ICD-10-CM | POA: Diagnosis not present

## 2018-04-18 DIAGNOSIS — H1859 Other hereditary corneal dystrophies: Secondary | ICD-10-CM | POA: Diagnosis not present

## 2018-04-18 DIAGNOSIS — H2513 Age-related nuclear cataract, bilateral: Secondary | ICD-10-CM | POA: Diagnosis not present

## 2018-05-09 ENCOUNTER — Ambulatory Visit: Admit: 2018-05-09 | Payer: Commercial Managed Care - HMO | Admitting: Ophthalmology

## 2018-05-09 SURGERY — PHACOEMULSIFICATION, CATARACT, WITH IOL INSERTION
Anesthesia: Topical | Laterality: Left

## 2018-06-20 ENCOUNTER — Encounter: Payer: Self-pay | Admitting: *Deleted

## 2018-06-20 ENCOUNTER — Other Ambulatory Visit: Payer: Self-pay

## 2018-06-21 DIAGNOSIS — I10 Essential (primary) hypertension: Secondary | ICD-10-CM | POA: Diagnosis not present

## 2018-06-21 DIAGNOSIS — H2512 Age-related nuclear cataract, left eye: Secondary | ICD-10-CM | POA: Diagnosis not present

## 2018-06-22 NOTE — Discharge Instructions (Signed)

## 2018-06-23 ENCOUNTER — Other Ambulatory Visit: Payer: Self-pay

## 2018-06-23 ENCOUNTER — Encounter
Admission: RE | Admit: 2018-06-23 | Discharge: 2018-06-23 | Disposition: A | Discharge status: Home / Self Care | Payer: Medicare HMO | Source: Ambulatory Visit | Attending: Ophthalmology | Admitting: Ophthalmology

## 2018-06-23 DIAGNOSIS — Z01812 Encounter for preprocedural laboratory examination: Secondary | ICD-10-CM | POA: Insufficient documentation

## 2018-06-23 DIAGNOSIS — Z1159 Encounter for screening for other viral diseases: Secondary | ICD-10-CM | POA: Diagnosis not present

## 2018-06-24 LAB — NOVEL CORONAVIRUS, NAA (HOSP ORDER, SEND-OUT TO REF LAB; TAT 18-24 HRS): SARS-CoV-2, NAA: NOT DETECTED

## 2018-06-28 ENCOUNTER — Ambulatory Visit
Admission: RE | Admit: 2018-06-28 | Discharge: 2018-06-28 | Disposition: A | Discharge status: Home / Self Care | Payer: Medicare HMO | Attending: Ophthalmology | Admitting: Ophthalmology

## 2018-06-28 ENCOUNTER — Other Ambulatory Visit: Payer: Self-pay

## 2018-06-28 ENCOUNTER — Encounter
Admission: RE | Disposition: A | Discharge status: Home / Self Care | Payer: Self-pay | Source: Home / Self Care | Attending: Ophthalmology

## 2018-06-28 ENCOUNTER — Ambulatory Visit: Payer: Medicare HMO | Admitting: Anesthesiology

## 2018-06-28 DIAGNOSIS — I10 Essential (primary) hypertension: Secondary | ICD-10-CM | POA: Diagnosis not present

## 2018-06-28 DIAGNOSIS — Z79899 Other long term (current) drug therapy: Secondary | ICD-10-CM | POA: Diagnosis not present

## 2018-06-28 DIAGNOSIS — H2512 Age-related nuclear cataract, left eye: Secondary | ICD-10-CM | POA: Diagnosis not present

## 2018-06-28 DIAGNOSIS — H25812 Combined forms of age-related cataract, left eye: Secondary | ICD-10-CM | POA: Diagnosis not present

## 2018-06-28 HISTORY — PX: CATARACT EXTRACTION W/PHACO: SHX586

## 2018-06-28 HISTORY — DX: Essential (primary) hypertension: I10

## 2018-06-28 HISTORY — DX: Chronic kidney disease, stage 4 (severe): N18.4

## 2018-06-28 HISTORY — DX: Unspecified osteoarthritis, unspecified site: M19.90

## 2018-06-28 SURGERY — PHACOEMULSIFICATION, CATARACT, WITH IOL INSERTION
Anesthesia: Monitor Anesthesia Care | Site: Eye | Laterality: Left

## 2018-06-28 MED ORDER — BRIMONIDINE TARTRATE-TIMOLOL 0.2-0.5 % OP SOLN
OPHTHALMIC | Status: DC | PRN
  Administered 2018-06-28: 1 [drp] via OPHTHALMIC

## 2018-06-28 MED ORDER — LIDOCAINE HCL (PF) 2 % IJ SOLN
INTRAOCULAR | Status: DC | PRN
  Administered 2018-06-28: 2 mL

## 2018-06-28 MED ORDER — TETRACAINE HCL 0.5 % OP SOLN
1.0000 [drp] | OPHTHALMIC | Status: DC | PRN
  Administered 2018-06-28 (×3): 1 [drp] via OPHTHALMIC

## 2018-06-28 MED ORDER — MOXIFLOXACIN HCL 0.5 % OP SOLN
1.0000 [drp] | OPHTHALMIC | Status: DC | PRN
  Administered 2018-06-28 (×3): 1 [drp] via OPHTHALMIC

## 2018-06-28 MED ORDER — MIDAZOLAM HCL 2 MG/2ML IJ SOLN
INTRAMUSCULAR | Status: DC | PRN
  Administered 2018-06-28: 2 mg via INTRAVENOUS

## 2018-06-28 MED ORDER — ARMC OPHTHALMIC DILATING DROPS
1.0000 "application " | OPHTHALMIC | Status: DC | PRN
  Administered 2018-06-28 (×3): 1 via OPHTHALMIC

## 2018-06-28 MED ORDER — NA HYALUR & NA CHOND-NA HYALUR 0.4-0.35 ML IO KIT
PACK | INTRAOCULAR | Status: DC | PRN
  Administered 2018-06-28: 1 mL via INTRAOCULAR

## 2018-06-28 MED ORDER — FENTANYL CITRATE (PF) 100 MCG/2ML IJ SOLN
INTRAMUSCULAR | Status: DC | PRN
  Administered 2018-06-28: 50 ug via INTRAVENOUS

## 2018-06-28 MED ORDER — EPINEPHRINE PF 1 MG/ML IJ SOLN
INTRAOCULAR | Status: DC | PRN
  Administered 2018-06-28: 48 mL via OPHTHALMIC

## 2018-06-28 MED ORDER — LACTATED RINGERS IV SOLN: INTRAVENOUS | Status: DC

## 2018-06-28 MED ORDER — CEFUROXIME OPHTHALMIC INJECTION 1 MG/0.1 ML
INJECTION | OPHTHALMIC | Status: DC | PRN
  Administered 2018-06-28: 0.1 mL via INTRACAMERAL

## 2018-06-28 SURGICAL SUPPLY — 18 items
CANNULA ANT/CHMB 27GA (MISCELLANEOUS) ×3 IMPLANT
GLOVE SURG LX 7.5 STRW (GLOVE) ×2
GLOVE SURG LX STRL 7.5 STRW (GLOVE) ×1 IMPLANT
GLOVE SURG TRIUMPH 8.0 PF LTX (GLOVE) ×3 IMPLANT
GOWN STRL REUS W/ TWL LRG LVL3 (GOWN DISPOSABLE) ×2 IMPLANT
GOWN STRL REUS W/TWL LRG LVL3 (GOWN DISPOSABLE) ×4
LENS IOL TECNIS ITEC 29.0 (Intraocular Lens) ×3 IMPLANT
MARKER SKIN DUAL TIP RULER LAB (MISCELLANEOUS) ×3 IMPLANT
NEEDLE FILTER BLUNT 18X 1/2SAF (NEEDLE) ×2
NEEDLE FILTER BLUNT 18X1 1/2 (NEEDLE) ×1 IMPLANT
PACK CATARACT BRASINGTON (MISCELLANEOUS) ×3 IMPLANT
PACK EYE AFTER SURG (MISCELLANEOUS) ×3 IMPLANT
PACK OPTHALMIC (MISCELLANEOUS) ×3 IMPLANT
SYR 3ML LL SCALE MARK (SYRINGE) ×3 IMPLANT
SYR 5ML LL (SYRINGE) ×3 IMPLANT
SYR TB 1ML LUER SLIP (SYRINGE) ×3 IMPLANT
WATER STERILE IRR 500ML POUR (IV SOLUTION) ×3 IMPLANT
WIPE NON LINTING 3.25X3.25 (MISCELLANEOUS) ×3 IMPLANT

## 2018-06-28 NOTE — Op Note (Signed)
OPERATIVE NOTE  Laura Meyer [id number] 06/28/2018   PREOPERATIVE DIAGNOSIS:  Nuclear sclerotic cataract left eye. H25.12   POSTOPERATIVE DIAGNOSIS:    Nuclear sclerotic cataract left eye.     PROCEDURE:  Phacoemusification with posterior chamber intraocular lens placement of the left eye   LENS:   Implant Name Type Inv. Item Serial No. Manufacturer Lot No. LRB No. Used  LENS IOL DIOP 29.0 - [id number] Intraocular Lens LENS IOL DIOP 29.0 [id number]  Left 1        ULTRASOUND TIME: 16  % of 1 minutes 2 seconds, CDE 10.2  SURGEON:  Wyonia Hough, MD   ANESTHESIA:  Topical with tetracaine drops and 2% Xylocaine jelly, augmented with 1% preservative-free intracameral lidocaine.    COMPLICATIONS:  None.   DESCRIPTION OF PROCEDURE:  The patient was identified in the holding room and transported to the operating room and placed in the supine position under the operating microscope.  The left eye was identified as the operative eye and it was prepped and draped in the usual sterile ophthalmic fashion.   A 1 millimeter clear-corneal paracentesis was made at the 1:30 position.  0.5 ml of preservative-free 1% lidocaine was injected into the anterior chamber.  The anterior chamber was filled with Viscoat viscoelastic.  A 2.4 millimeter keratome was used to make a near-clear corneal incision at the 10:30 position.  .  A curvilinear capsulorrhexis was made with a cystotome and capsulorrhexis forceps.  Balanced salt solution was used to hydrodissect and hydrodelineate the nucleus.   Phacoemulsification was then used in stop and chop fashion to remove the lens nucleus and epinucleus.  The remaining cortex was then removed using the irrigation and aspiration handpiece. Provisc was then placed into the capsular bag to distend it for lens placement.  A lens was then injected into the capsular bag.  The remaining viscoelastic was aspirated.   Wounds were hydrated with balanced salt  solution.  The anterior chamber was inflated to a physiologic pressure with balanced salt solution.  No wound leaks were noted. Cefuroxime 0.1 ml of a 10mg /ml solution was injected into the anterior chamber for a dose of 1 mg of intracameral antibiotic at the completion of the case.   Timolol and Brimonidine drops were applied to the eye.  The patient was taken to the recovery room in stable condition without complications of anesthesia or surgery.  Laura Meyer 06/28/2018, 12:44 PM

## 2018-06-28 NOTE — Anesthesia Preprocedure Evaluation (Signed)
Anesthesia Evaluation  Patient identified by MRN, date of birth, ID band Patient awake    Reviewed: Allergy & Precautions, H&P , NPO status , Patient's Chart, lab work & pertinent test results  History of Anesthesia Complications Negative for: history of anesthetic complications  Airway Mallampati: II  TM Distance: >3 FB Neck ROM: full    Dental no notable dental hx.    Pulmonary neg pulmonary ROS,    Pulmonary exam normal        Cardiovascular hypertension, On Medications Normal cardiovascular exam     Neuro/Psych negative neurological ROS     GI/Hepatic negative GI ROS, Neg liver ROS,   Endo/Other  negative endocrine ROS  Renal/GU   negative genitourinary   Musculoskeletal   Abdominal   Peds  Hematology negative hematology ROS (+)   Anesthesia Other Findings   Reproductive/Obstetrics                             Anesthesia Physical Anesthesia Plan  ASA: II  Anesthesia Plan: MAC   Post-op Pain Management:    Induction:   PONV Risk Score and Plan:   Airway Management Planned:   Additional Equipment:   Intra-op Plan:   Post-operative Plan:   Informed Consent: I have reviewed the patients History and Physical, chart, labs and discussed the procedure including the risks, benefits and alternatives for the proposed anesthesia with the patient or authorized representative who has indicated his/her understanding and acceptance.       Plan Discussed with:   Anesthesia Plan Comments:         Anesthesia Quick Evaluation

## 2018-06-28 NOTE — H&P (Signed)

## 2018-06-28 NOTE — Anesthesia Postprocedure Evaluation (Signed)
Anesthesia Post Note  Patient: Laura Meyer  Procedure(s) Performed: CATARACT EXTRACTION PHACO AND INTRAOCULAR LENS PLACEMENT ([place])  LEFT (Left Eye)  Patient location during evaluation: PACU Anesthesia Type: MAC Level of consciousness: awake and alert Pain management: pain level controlled Vital Signs Assessment: post-procedure vital signs reviewed and stable Respiratory status: spontaneous breathing Cardiovascular status: stable Anesthetic complications: no    Jaci Standard, III,  Eulah Walkup D

## 2018-06-28 NOTE — Transfer of Care (Signed)
Immediate Anesthesia Transfer of Care Note  Patient: Laura Meyer  Procedure(s) Performed: CATARACT EXTRACTION PHACO AND INTRAOCULAR LENS PLACEMENT ([place])  LEFT (Left Eye)  Patient Location: PACU  Anesthesia Type: MAC  Level of Consciousness: awake, alert  and patient cooperative  Airway and Oxygen Therapy: Patient Spontanous Breathing and Patient connected to supplemental oxygen  Post-op Assessment: Post-op Vital signs reviewed, Patient's Cardiovascular Status Stable, Respiratory Function Stable, Patent Airway and No signs of Nausea or vomiting  Post-op Vital Signs: Reviewed and stable  Complications: No apparent anesthesia complications

## 2018-06-28 NOTE — Anesthesia Procedure Notes (Signed)
Procedure Name: MAC Performed by: Ja Ohman, CRNA Pre-anesthesia Checklist: Patient identified, Emergency Drugs available, Suction available, Timeout performed and Patient being monitored Patient Re-evaluated:Patient Re-evaluated prior to induction Oxygen Delivery Method: Nasal cannula Placement Confirmation: positive ETCO2       

## 2018-06-29 ENCOUNTER — Encounter: Payer: Self-pay | Admitting: Ophthalmology

## 2018-07-09 DIAGNOSIS — H2511 Age-related nuclear cataract, right eye: Secondary | ICD-10-CM | POA: Diagnosis not present

## 2018-07-12 ENCOUNTER — Other Ambulatory Visit: Payer: Self-pay

## 2018-07-13 ENCOUNTER — Other Ambulatory Visit
Admission: RE | Admit: 2018-07-13 | Discharge: 2018-07-13 | Disposition: A | Discharge status: Home / Self Care | Payer: Medicare HMO | Source: Ambulatory Visit | Attending: Ophthalmology | Admitting: Ophthalmology

## 2018-07-13 NOTE — Discharge Instructions (Signed)

## 2018-07-14 ENCOUNTER — Other Ambulatory Visit
Admission: RE | Admit: 2018-07-14 | Discharge: 2018-07-14 | Disposition: A | Discharge status: Home / Self Care | Payer: Medicare HMO | Source: Ambulatory Visit | Attending: Ophthalmology | Admitting: Ophthalmology

## 2018-07-14 ENCOUNTER — Other Ambulatory Visit: Admission: RE | Admit: 2018-07-14 | Payer: Medicare HMO | Source: Ambulatory Visit

## 2018-07-14 ENCOUNTER — Other Ambulatory Visit: Payer: Self-pay

## 2018-07-14 DIAGNOSIS — Z1159 Encounter for screening for other viral diseases: Secondary | ICD-10-CM | POA: Insufficient documentation

## 2018-07-14 DIAGNOSIS — Z01812 Encounter for preprocedural laboratory examination: Secondary | ICD-10-CM | POA: Diagnosis not present

## 2018-07-15 LAB — NOVEL CORONAVIRUS, NAA (HOSP ORDER, SEND-OUT TO REF LAB; TAT 18-24 HRS): SARS-CoV-2, NAA: NOT DETECTED

## 2018-07-19 ENCOUNTER — Ambulatory Visit: Payer: Medicare HMO | Admitting: Anesthesiology

## 2018-07-19 ENCOUNTER — Encounter
Admission: RE | Disposition: A | Discharge status: Home / Self Care | Payer: Self-pay | Source: Home / Self Care | Attending: Ophthalmology

## 2018-07-19 ENCOUNTER — Ambulatory Visit
Admission: RE | Admit: 2018-07-19 | Discharge: 2018-07-19 | Disposition: A | Discharge status: Home / Self Care | Payer: Medicare HMO | Attending: Ophthalmology | Admitting: Ophthalmology

## 2018-07-19 ENCOUNTER — Other Ambulatory Visit: Payer: Self-pay

## 2018-07-19 DIAGNOSIS — I129 Hypertensive chronic kidney disease with stage 1 through stage 4 chronic kidney disease, or unspecified chronic kidney disease: Secondary | ICD-10-CM | POA: Diagnosis not present

## 2018-07-19 DIAGNOSIS — E890 Postprocedural hypothyroidism: Secondary | ICD-10-CM | POA: Insufficient documentation

## 2018-07-19 DIAGNOSIS — H25811 Combined forms of age-related cataract, right eye: Secondary | ICD-10-CM | POA: Diagnosis not present

## 2018-07-19 DIAGNOSIS — H2511 Age-related nuclear cataract, right eye: Secondary | ICD-10-CM | POA: Diagnosis not present

## 2018-07-19 DIAGNOSIS — N184 Chronic kidney disease, stage 4 (severe): Secondary | ICD-10-CM | POA: Diagnosis not present

## 2018-07-19 DIAGNOSIS — Z79899 Other long term (current) drug therapy: Secondary | ICD-10-CM | POA: Insufficient documentation

## 2018-07-19 DIAGNOSIS — Z6841 Body Mass Index (BMI) 40.0 and over, adult: Secondary | ICD-10-CM | POA: Diagnosis not present

## 2018-07-19 DIAGNOSIS — E7800 Pure hypercholesterolemia, unspecified: Secondary | ICD-10-CM | POA: Insufficient documentation

## 2018-07-19 DIAGNOSIS — Z96653 Presence of artificial knee joint, bilateral: Secondary | ICD-10-CM | POA: Diagnosis not present

## 2018-07-19 DIAGNOSIS — M1990 Unspecified osteoarthritis, unspecified site: Secondary | ICD-10-CM | POA: Diagnosis not present

## 2018-07-19 DIAGNOSIS — M109 Gout, unspecified: Secondary | ICD-10-CM | POA: Diagnosis not present

## 2018-07-19 HISTORY — PX: CATARACT EXTRACTION W/PHACO: SHX586

## 2018-07-19 SURGERY — PHACOEMULSIFICATION, CATARACT, WITH IOL INSERTION
Anesthesia: Monitor Anesthesia Care | Site: Eye | Laterality: Right

## 2018-07-19 MED ORDER — EPINEPHRINE PF 1 MG/ML IJ SOLN
INTRAOCULAR | Status: DC | PRN
  Administered 2018-07-19: 54 mL via OPHTHALMIC

## 2018-07-19 MED ORDER — LIDOCAINE HCL (PF) 2 % IJ SOLN
INTRAOCULAR | Status: DC | PRN
  Administered 2018-07-19: 1 mL

## 2018-07-19 MED ORDER — CEFUROXIME OPHTHALMIC INJECTION 1 MG/0.1 ML
INJECTION | OPHTHALMIC | Status: DC | PRN
  Administered 2018-07-19: 0.1 mL via INTRACAMERAL

## 2018-07-19 MED ORDER — MOXIFLOXACIN HCL 0.5 % OP SOLN
1.0000 [drp] | OPHTHALMIC | Status: DC | PRN
  Administered 2018-07-19 (×3): 1 [drp] via OPHTHALMIC

## 2018-07-19 MED ORDER — BRIMONIDINE TARTRATE-TIMOLOL 0.2-0.5 % OP SOLN
OPHTHALMIC | Status: DC | PRN
  Administered 2018-07-19: 1 [drp] via OPHTHALMIC

## 2018-07-19 MED ORDER — MIDAZOLAM HCL 2 MG/2ML IJ SOLN
INTRAMUSCULAR | Status: DC | PRN
  Administered 2018-07-19 (×2): 1 mg via INTRAVENOUS

## 2018-07-19 MED ORDER — NA HYALUR & NA CHOND-NA HYALUR 0.4-0.35 ML IO KIT
PACK | INTRAOCULAR | Status: DC | PRN
  Administered 2018-07-19: 1 mL via INTRAOCULAR

## 2018-07-19 MED ORDER — FENTANYL CITRATE (PF) 100 MCG/2ML IJ SOLN
INTRAMUSCULAR | Status: DC | PRN
  Administered 2018-07-19: 50 ug via INTRAVENOUS

## 2018-07-19 MED ORDER — ARMC OPHTHALMIC DILATING DROPS
1.0000 "application " | OPHTHALMIC | Status: DC | PRN
  Administered 2018-07-19 (×3): 1 via OPHTHALMIC

## 2018-07-19 MED ORDER — TETRACAINE HCL 0.5 % OP SOLN
1.0000 [drp] | OPHTHALMIC | Status: DC | PRN
  Administered 2018-07-19 (×3): 1 [drp] via OPHTHALMIC

## 2018-07-19 MED ORDER — LACTATED RINGERS IV SOLN: INTRAVENOUS | Status: DC

## 2018-07-19 SURGICAL SUPPLY — 20 items
CANNULA ANT/CHMB 27G (MISCELLANEOUS) ×1 IMPLANT
CANNULA ANT/CHMB 27GA (MISCELLANEOUS) ×3 IMPLANT
GLOVE SURG LX 7.5 STRW (GLOVE) ×2
GLOVE SURG LX STRL 7.5 STRW (GLOVE) ×1 IMPLANT
GLOVE SURG TRIUMPH 8.0 PF LTX (GLOVE) ×3 IMPLANT
GOWN STRL REUS W/ TWL LRG LVL3 (GOWN DISPOSABLE) ×2 IMPLANT
GOWN STRL REUS W/TWL LRG LVL3 (GOWN DISPOSABLE) ×4
LENS IOL TECNIS ITEC 24.5 (Intraocular Lens) ×2 IMPLANT
MARKER SKIN DUAL TIP RULER LAB (MISCELLANEOUS) ×3 IMPLANT
NDL FILTER BLUNT 18X1 1/2 (NEEDLE) ×1 IMPLANT
NEEDLE FILTER BLUNT 18X 1/2SAF (NEEDLE) ×2
NEEDLE FILTER BLUNT 18X1 1/2 (NEEDLE) ×1 IMPLANT
PACK CATARACT BRASINGTON (MISCELLANEOUS) ×3 IMPLANT
PACK EYE AFTER SURG (MISCELLANEOUS) ×3 IMPLANT
PACK OPTHALMIC (MISCELLANEOUS) ×3 IMPLANT
SYR 3ML LL SCALE MARK (SYRINGE) ×3 IMPLANT
SYR 5ML LL (SYRINGE) ×3 IMPLANT
SYR TB 1ML LUER SLIP (SYRINGE) ×3 IMPLANT
WATER STERILE IRR 500ML POUR (IV SOLUTION) ×3 IMPLANT
WIPE NON LINTING 3.25X3.25 (MISCELLANEOUS) ×3 IMPLANT

## 2018-07-19 NOTE — Anesthesia Procedure Notes (Signed)
Procedure Name: MAC Performed by: Calea Hribar, CRNA Pre-anesthesia Checklist: Patient identified, Emergency Drugs available, Suction available, Timeout performed and Patient being monitored Patient Re-evaluated:Patient Re-evaluated prior to induction Oxygen Delivery Method: Nasal cannula Placement Confirmation: positive ETCO2       

## 2018-07-19 NOTE — Op Note (Signed)
LOCATION:  [place]   PREOPERATIVE DIAGNOSIS:    Nuclear sclerotic cataract right eye. H25.11   POSTOPERATIVE DIAGNOSIS:  Nuclear sclerotic cataract right eye.     PROCEDURE:  Phacoemusification with posterior chamber intraocular lens placement of the right eye   LENS:   Implant Name Type Inv. Item Serial No. Manufacturer Lot No. LRB No. Used Action  LENS IOL DIOP 24.5 - [id number] Intraocular Lens LENS IOL DIOP 24.5 [id number] AMO  Right 1 Implanted        ULTRASOUND TIME: 12 % of 0 minutes, 52 seconds.  CDE 6.1   SURGEON:  Wyonia Hough, MD   ANESTHESIA:  Topical with tetracaine drops and 2% Xylocaine jelly, augmented with 1% preservative-free intracameral lidocaine.    COMPLICATIONS:  None.   DESCRIPTION OF PROCEDURE:  The patient was identified in the holding room and transported to the operating room and placed in the supine position under the operating microscope.  The right eye was identified as the operative eye and it was prepped and draped in the usual sterile ophthalmic fashion.   A 1 millimeter clear-corneal paracentesis was made at the 12:00 position.  0.5 ml of preservative-free 1% lidocaine was injected into the anterior chamber. The anterior chamber was filled with Viscoat viscoelastic.  A 2.4 millimeter keratome was used to make a near-clear corneal incision at the 9:00 position.  A curvilinear capsulorrhexis was made with a cystotome and capsulorrhexis forceps.  Balanced salt solution was used to hydrodissect and hydrodelineate the nucleus.   Phacoemulsification was then used in stop and chop fashion to remove the lens nucleus and epinucleus.  The remaining cortex was then removed using the irrigation and aspiration handpiece. Provisc was then placed into the capsular bag to distend it for lens placement.  A lens was then injected into the capsular bag.  The remaining viscoelastic was aspirated.   Wounds were hydrated with balanced salt solution.   The anterior chamber was inflated to a physiologic pressure with balanced salt solution.  No wound leaks were noted. Cefuroxime 0.1 ml of a 10mg /ml solution was injected into the anterior chamber for a dose of 1 mg of intracameral antibiotic at the completion of the case.   Timolol and Brimonidine drops were applied to the eye.  The patient was taken to the recovery room in stable condition without complications of anesthesia or surgery.   Allicia Culley 07/19/2018, 11:25 AM

## 2018-07-19 NOTE — Anesthesia Postprocedure Evaluation (Signed)
Anesthesia Post Note  Patient: Laura Meyer  Procedure(s) Performed: CATARACT EXTRACTION PHACO AND INTRAOCULAR LENS PLACEMENT ([place]) RIGHT (Right Eye)  Patient location during evaluation: PACU Anesthesia Type: MAC Level of consciousness: awake and alert Pain management: pain level controlled Vital Signs Assessment: post-procedure vital signs reviewed and stable Respiratory status: spontaneous breathing, nonlabored ventilation, respiratory function stable and patient connected to nasal cannula oxygen Cardiovascular status: stable and blood pressure returned to baseline Postop Assessment: no apparent nausea or vomiting Anesthetic complications: no    Anicia Leuthold

## 2018-07-19 NOTE — Anesthesia Preprocedure Evaluation (Signed)
Anesthesia Evaluation  Patient identified by MRN, date of birth, ID band  Reviewed: NPO status   History of Anesthesia Complications Negative for: history of anesthetic complications  Airway Mallampati: II  TM Distance: >3 FB Neck ROM: full    Dental no notable dental hx.    Pulmonary neg pulmonary ROS,    Pulmonary exam normal        Cardiovascular Exercise Tolerance: Good hypertension, Normal cardiovascular exam     Neuro/Psych negative neurological ROS  negative psych ROS   GI/Hepatic negative GI ROS, Neg liver ROS,   Endo/Other  Morbid obesity (bmi 45)  Renal/GU CRFRenal disease (ckd4)  negative genitourinary   Musculoskeletal  (+) Arthritis , gout   Abdominal   Peds  Hematology negative hematology ROS (+)   Anesthesia Other Findings   Reproductive/Obstetrics                             Anesthesia Physical Anesthesia Plan  ASA: III  Anesthesia Plan: MAC   Post-op Pain Management:    Induction:   PONV Risk Score and Plan:   Airway Management Planned:   Additional Equipment:   Intra-op Plan:   Post-operative Plan:   Informed Consent: I have reviewed the patients History and Physical, chart, labs and discussed the procedure including the risks, benefits and alternatives for the proposed anesthesia with the patient or authorized representative who has indicated his/her understanding and acceptance.       Plan Discussed with: CRNA  Anesthesia Plan Comments:         Anesthesia Quick Evaluation

## 2018-07-19 NOTE — Transfer of Care (Signed)
Immediate Anesthesia Transfer of Care Note  Patient: Laura Meyer  Procedure(s) Performed: CATARACT EXTRACTION PHACO AND INTRAOCULAR LENS PLACEMENT ([facility]) RIGHT (Right Eye)  Patient Location: PACU  Anesthesia Type: MAC  Level of Consciousness: awake, alert  and patient cooperative  Airway and Oxygen Therapy: Patient Spontanous Breathing and Patient connected to supplemental oxygen  Post-op Assessment: Post-op Vital signs reviewed, Patient's Cardiovascular Status Stable, Respiratory Function Stable, Patent Airway and No signs of Nausea or vomiting  Post-op Vital Signs: Reviewed and stable  Complications: No apparent anesthesia complications

## 2018-07-19 NOTE — H&P (Signed)

## 2018-07-20 ENCOUNTER — Encounter: Payer: Self-pay | Admitting: Ophthalmology

## 2018-09-27 DIAGNOSIS — Z6841 Body Mass Index (BMI) 40.0 and over, adult: Secondary | ICD-10-CM | POA: Diagnosis not present

## 2018-09-27 DIAGNOSIS — R7303 Prediabetes: Secondary | ICD-10-CM | POA: Diagnosis not present

## 2018-09-27 DIAGNOSIS — I129 Hypertensive chronic kidney disease with stage 1 through stage 4 chronic kidney disease, or unspecified chronic kidney disease: Secondary | ICD-10-CM | POA: Diagnosis not present

## 2018-09-27 DIAGNOSIS — Z0000 Encounter for general adult medical examination without abnormal findings: Secondary | ICD-10-CM | POA: Insufficient documentation

## 2018-09-27 DIAGNOSIS — N184 Chronic kidney disease, stage 4 (severe): Secondary | ICD-10-CM | POA: Diagnosis not present

## 2018-09-27 DIAGNOSIS — E7800 Pure hypercholesterolemia, unspecified: Secondary | ICD-10-CM | POA: Diagnosis not present

## 2018-09-27 DIAGNOSIS — I10 Essential (primary) hypertension: Secondary | ICD-10-CM | POA: Diagnosis not present

## 2018-10-11 DIAGNOSIS — R809 Proteinuria, unspecified: Secondary | ICD-10-CM | POA: Diagnosis not present

## 2018-10-11 DIAGNOSIS — I10 Essential (primary) hypertension: Secondary | ICD-10-CM | POA: Diagnosis not present

## 2018-10-11 DIAGNOSIS — N184 Chronic kidney disease, stage 4 (severe): Secondary | ICD-10-CM | POA: Diagnosis not present

## 2018-10-11 DIAGNOSIS — E210 Primary hyperparathyroidism: Secondary | ICD-10-CM | POA: Diagnosis not present

## 2019-02-05 DIAGNOSIS — E210 Primary hyperparathyroidism: Secondary | ICD-10-CM | POA: Diagnosis not present

## 2019-02-05 DIAGNOSIS — R809 Proteinuria, unspecified: Secondary | ICD-10-CM | POA: Diagnosis not present

## 2019-02-05 DIAGNOSIS — N184 Chronic kidney disease, stage 4 (severe): Secondary | ICD-10-CM | POA: Diagnosis not present

## 2019-02-05 DIAGNOSIS — I10 Essential (primary) hypertension: Secondary | ICD-10-CM | POA: Diagnosis not present

## 2019-02-08 DIAGNOSIS — E210 Primary hyperparathyroidism: Secondary | ICD-10-CM | POA: Diagnosis not present

## 2019-02-08 DIAGNOSIS — I10 Essential (primary) hypertension: Secondary | ICD-10-CM | POA: Diagnosis not present

## 2019-02-08 DIAGNOSIS — R801 Persistent proteinuria, unspecified: Secondary | ICD-10-CM | POA: Diagnosis not present

## 2019-02-08 DIAGNOSIS — N184 Chronic kidney disease, stage 4 (severe): Secondary | ICD-10-CM | POA: Diagnosis not present

## 2019-03-15 DIAGNOSIS — E041 Nontoxic single thyroid nodule: Secondary | ICD-10-CM | POA: Diagnosis not present

## 2019-03-15 DIAGNOSIS — I129 Hypertensive chronic kidney disease with stage 1 through stage 4 chronic kidney disease, or unspecified chronic kidney disease: Secondary | ICD-10-CM | POA: Diagnosis not present

## 2019-03-15 DIAGNOSIS — E7800 Pure hypercholesterolemia, unspecified: Secondary | ICD-10-CM | POA: Diagnosis not present

## 2019-03-15 DIAGNOSIS — N184 Chronic kidney disease, stage 4 (severe): Secondary | ICD-10-CM | POA: Diagnosis not present

## 2019-03-15 DIAGNOSIS — R7303 Prediabetes: Secondary | ICD-10-CM | POA: Diagnosis not present

## 2019-03-16 DIAGNOSIS — H01003 Unspecified blepharitis right eye, unspecified eyelid: Secondary | ICD-10-CM | POA: Diagnosis not present

## 2019-04-24 DIAGNOSIS — Z961 Presence of intraocular lens: Secondary | ICD-10-CM | POA: Diagnosis not present

## 2019-05-15 DIAGNOSIS — N184 Chronic kidney disease, stage 4 (severe): Secondary | ICD-10-CM | POA: Diagnosis not present

## 2019-05-15 DIAGNOSIS — R8290 Unspecified abnormal findings in urine: Secondary | ICD-10-CM | POA: Diagnosis not present

## 2019-05-22 DIAGNOSIS — N184 Chronic kidney disease, stage 4 (severe): Secondary | ICD-10-CM | POA: Diagnosis not present

## 2019-05-22 DIAGNOSIS — I10 Essential (primary) hypertension: Secondary | ICD-10-CM | POA: Diagnosis not present

## 2019-05-22 DIAGNOSIS — Z6841 Body Mass Index (BMI) 40.0 and over, adult: Secondary | ICD-10-CM | POA: Diagnosis not present

## 2019-05-22 DIAGNOSIS — E1122 Type 2 diabetes mellitus with diabetic chronic kidney disease: Secondary | ICD-10-CM | POA: Diagnosis not present

## 2019-05-22 DIAGNOSIS — R801 Persistent proteinuria, unspecified: Secondary | ICD-10-CM | POA: Diagnosis not present

## 2019-05-22 DIAGNOSIS — E210 Primary hyperparathyroidism: Secondary | ICD-10-CM | POA: Diagnosis not present

## 2019-08-01 DIAGNOSIS — N184 Chronic kidney disease, stage 4 (severe): Secondary | ICD-10-CM | POA: Diagnosis not present

## 2019-08-14 DIAGNOSIS — N184 Chronic kidney disease, stage 4 (severe): Secondary | ICD-10-CM | POA: Diagnosis not present

## 2019-08-14 DIAGNOSIS — R8290 Unspecified abnormal findings in urine: Secondary | ICD-10-CM | POA: Diagnosis not present

## 2019-08-20 DIAGNOSIS — R739 Hyperglycemia, unspecified: Secondary | ICD-10-CM | POA: Insufficient documentation

## 2019-08-21 DIAGNOSIS — E210 Primary hyperparathyroidism: Secondary | ICD-10-CM | POA: Diagnosis not present

## 2019-08-21 DIAGNOSIS — N184 Chronic kidney disease, stage 4 (severe): Secondary | ICD-10-CM | POA: Diagnosis not present

## 2019-08-21 DIAGNOSIS — I10 Essential (primary) hypertension: Secondary | ICD-10-CM | POA: Diagnosis not present

## 2019-08-21 DIAGNOSIS — R801 Persistent proteinuria, unspecified: Secondary | ICD-10-CM | POA: Diagnosis not present

## 2019-12-17 DIAGNOSIS — N184 Chronic kidney disease, stage 4 (severe): Secondary | ICD-10-CM | POA: Diagnosis not present

## 2019-12-19 DIAGNOSIS — J019 Acute sinusitis, unspecified: Secondary | ICD-10-CM | POA: Diagnosis not present

## 2019-12-19 DIAGNOSIS — Z03818 Encounter for observation for suspected exposure to other biological agents ruled out: Secondary | ICD-10-CM | POA: Diagnosis not present

## 2019-12-19 DIAGNOSIS — J209 Acute bronchitis, unspecified: Secondary | ICD-10-CM | POA: Diagnosis not present

## 2019-12-19 DIAGNOSIS — B9689 Other specified bacterial agents as the cause of diseases classified elsewhere: Secondary | ICD-10-CM | POA: Diagnosis not present

## 2019-12-19 DIAGNOSIS — U071 COVID-19: Secondary | ICD-10-CM | POA: Diagnosis not present

## 2020-01-01 DIAGNOSIS — R053 Chronic cough: Secondary | ICD-10-CM | POA: Diagnosis not present

## 2020-01-01 DIAGNOSIS — U071 COVID-19: Secondary | ICD-10-CM | POA: Diagnosis not present

## 2020-01-01 DIAGNOSIS — R0602 Shortness of breath: Secondary | ICD-10-CM | POA: Diagnosis not present

## 2020-01-01 DIAGNOSIS — J984 Other disorders of lung: Secondary | ICD-10-CM | POA: Diagnosis not present

## 2020-02-07 DIAGNOSIS — E041 Nontoxic single thyroid nodule: Secondary | ICD-10-CM | POA: Diagnosis not present

## 2020-02-07 DIAGNOSIS — E7800 Pure hypercholesterolemia, unspecified: Secondary | ICD-10-CM | POA: Diagnosis not present

## 2020-02-07 DIAGNOSIS — R7303 Prediabetes: Secondary | ICD-10-CM | POA: Diagnosis not present

## 2020-02-07 DIAGNOSIS — N2581 Secondary hyperparathyroidism of renal origin: Secondary | ICD-10-CM | POA: Insufficient documentation

## 2020-02-07 DIAGNOSIS — N184 Chronic kidney disease, stage 4 (severe): Secondary | ICD-10-CM | POA: Diagnosis not present

## 2020-02-07 DIAGNOSIS — I129 Hypertensive chronic kidney disease with stage 1 through stage 4 chronic kidney disease, or unspecified chronic kidney disease: Secondary | ICD-10-CM | POA: Diagnosis not present

## 2020-04-22 DIAGNOSIS — R801 Persistent proteinuria, unspecified: Secondary | ICD-10-CM | POA: Diagnosis not present

## 2020-04-22 DIAGNOSIS — I10 Essential (primary) hypertension: Secondary | ICD-10-CM | POA: Diagnosis not present

## 2020-04-22 DIAGNOSIS — N184 Chronic kidney disease, stage 4 (severe): Secondary | ICD-10-CM | POA: Diagnosis not present

## 2020-04-22 DIAGNOSIS — E210 Primary hyperparathyroidism: Secondary | ICD-10-CM | POA: Diagnosis not present

## 2020-04-29 DIAGNOSIS — N184 Chronic kidney disease, stage 4 (severe): Secondary | ICD-10-CM | POA: Diagnosis not present

## 2020-04-29 DIAGNOSIS — E1122 Type 2 diabetes mellitus with diabetic chronic kidney disease: Secondary | ICD-10-CM | POA: Diagnosis not present

## 2020-04-29 DIAGNOSIS — E210 Primary hyperparathyroidism: Secondary | ICD-10-CM | POA: Diagnosis not present

## 2020-04-29 DIAGNOSIS — I10 Essential (primary) hypertension: Secondary | ICD-10-CM | POA: Diagnosis not present

## 2020-08-07 DIAGNOSIS — I10 Essential (primary) hypertension: Secondary | ICD-10-CM | POA: Diagnosis not present

## 2020-08-07 DIAGNOSIS — R82998 Other abnormal findings in urine: Secondary | ICD-10-CM | POA: Diagnosis not present

## 2020-08-07 DIAGNOSIS — N184 Chronic kidney disease, stage 4 (severe): Secondary | ICD-10-CM | POA: Diagnosis not present

## 2020-08-14 DIAGNOSIS — I10 Essential (primary) hypertension: Secondary | ICD-10-CM | POA: Diagnosis not present

## 2020-08-14 DIAGNOSIS — R801 Persistent proteinuria, unspecified: Secondary | ICD-10-CM | POA: Diagnosis not present

## 2020-08-14 DIAGNOSIS — N184 Chronic kidney disease, stage 4 (severe): Secondary | ICD-10-CM | POA: Diagnosis not present

## 2020-08-14 DIAGNOSIS — E1122 Type 2 diabetes mellitus with diabetic chronic kidney disease: Secondary | ICD-10-CM | POA: Diagnosis not present

## 2020-08-14 DIAGNOSIS — E210 Primary hyperparathyroidism: Secondary | ICD-10-CM | POA: Diagnosis not present

## 2020-10-02 DIAGNOSIS — R7303 Prediabetes: Secondary | ICD-10-CM | POA: Diagnosis not present

## 2020-10-02 DIAGNOSIS — Z0000 Encounter for general adult medical examination without abnormal findings: Secondary | ICD-10-CM | POA: Diagnosis not present

## 2020-10-02 DIAGNOSIS — N184 Chronic kidney disease, stage 4 (severe): Secondary | ICD-10-CM | POA: Diagnosis not present

## 2020-10-02 DIAGNOSIS — I129 Hypertensive chronic kidney disease with stage 1 through stage 4 chronic kidney disease, or unspecified chronic kidney disease: Secondary | ICD-10-CM | POA: Diagnosis not present

## 2020-10-02 DIAGNOSIS — E7800 Pure hypercholesterolemia, unspecified: Secondary | ICD-10-CM | POA: Diagnosis not present

## 2020-10-02 DIAGNOSIS — Z6837 Body mass index (BMI) 37.0-37.9, adult: Secondary | ICD-10-CM | POA: Diagnosis not present

## 2020-10-15 ENCOUNTER — Other Ambulatory Visit (HOSPITAL_COMMUNITY): Payer: Self-pay | Admitting: Nephrology

## 2020-10-15 ENCOUNTER — Other Ambulatory Visit: Payer: Self-pay | Admitting: Nephrology

## 2020-10-15 DIAGNOSIS — R801 Persistent proteinuria, unspecified: Secondary | ICD-10-CM

## 2020-10-15 DIAGNOSIS — I10 Essential (primary) hypertension: Secondary | ICD-10-CM | POA: Diagnosis not present

## 2020-10-15 DIAGNOSIS — N185 Chronic kidney disease, stage 5: Secondary | ICD-10-CM

## 2020-10-15 DIAGNOSIS — N281 Cyst of kidney, acquired: Secondary | ICD-10-CM | POA: Diagnosis not present

## 2020-10-15 DIAGNOSIS — E210 Primary hyperparathyroidism: Secondary | ICD-10-CM | POA: Diagnosis not present

## 2020-10-15 DIAGNOSIS — E1122 Type 2 diabetes mellitus with diabetic chronic kidney disease: Secondary | ICD-10-CM | POA: Diagnosis not present

## 2020-10-15 DIAGNOSIS — D631 Anemia in chronic kidney disease: Secondary | ICD-10-CM | POA: Diagnosis not present

## 2020-10-27 ENCOUNTER — Ambulatory Visit
Admission: RE | Admit: 2020-10-27 | Discharge: 2020-10-27 | Disposition: A | Discharge status: Home / Self Care | Payer: Medicare HMO | Source: Ambulatory Visit | Attending: Nephrology | Admitting: Nephrology

## 2020-10-27 ENCOUNTER — Other Ambulatory Visit: Payer: Self-pay

## 2020-10-27 DIAGNOSIS — R801 Persistent proteinuria, unspecified: Secondary | ICD-10-CM | POA: Diagnosis not present

## 2020-10-27 DIAGNOSIS — N281 Cyst of kidney, acquired: Secondary | ICD-10-CM | POA: Diagnosis not present

## 2020-10-27 DIAGNOSIS — N185 Chronic kidney disease, stage 5: Secondary | ICD-10-CM

## 2020-10-27 DIAGNOSIS — R809 Proteinuria, unspecified: Secondary | ICD-10-CM | POA: Diagnosis not present

## 2020-10-27 DIAGNOSIS — N189 Chronic kidney disease, unspecified: Secondary | ICD-10-CM | POA: Diagnosis not present

## 2020-10-27 IMAGING — US US RENAL
1 series · 13 of 25 positions shown · non-contrast
Comparison: Ultrasound [DATE].  PET-CT [DATE].

CLINICAL DATA: Persistent proteinuria.  Chronic renal disease.

EXAM:
RENAL / URINARY TRACT ULTRASOUND COMPLETE

[Series 1: us renal · 13 of 67 slices shown]
[im 1/67]
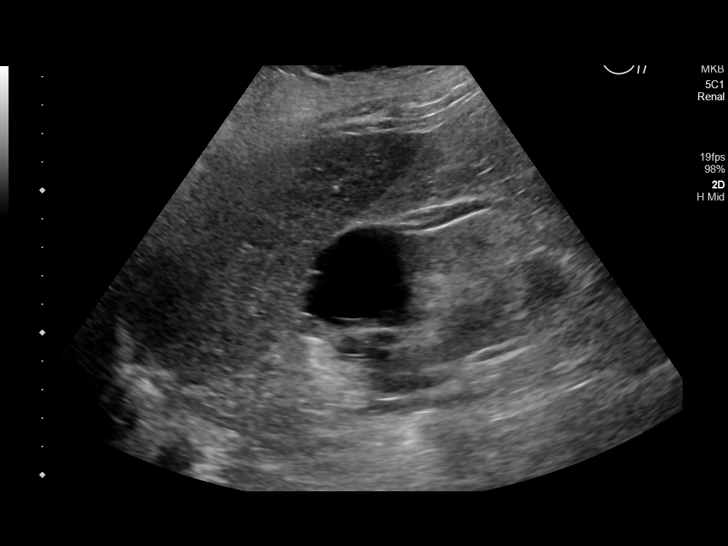
[im 6/67]
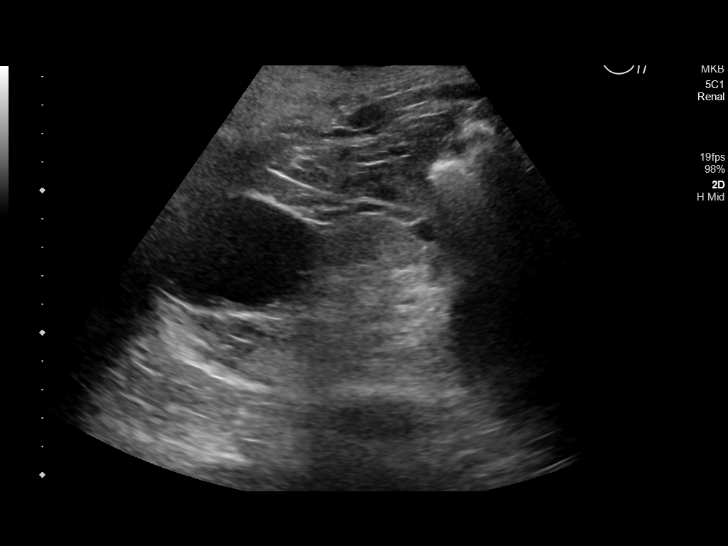
[im 12/67]
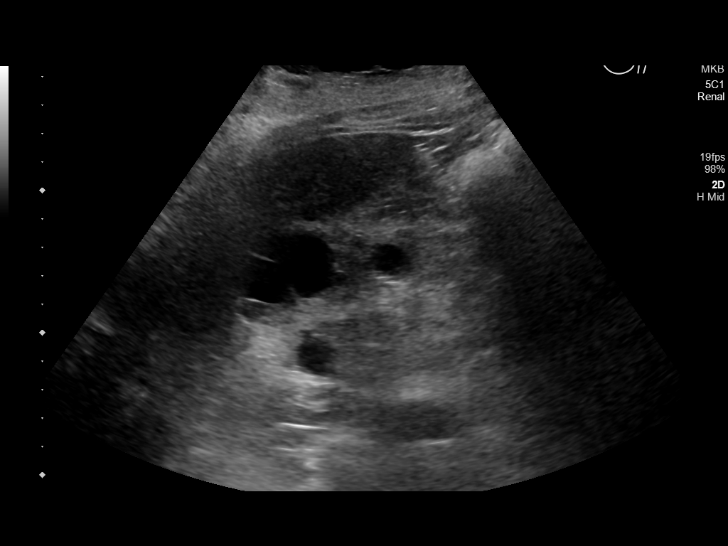
[im 17/67]
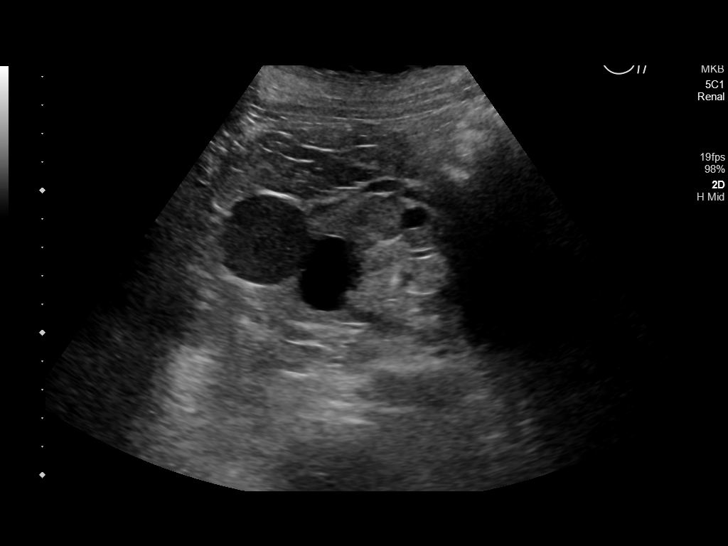
[im 23/67]
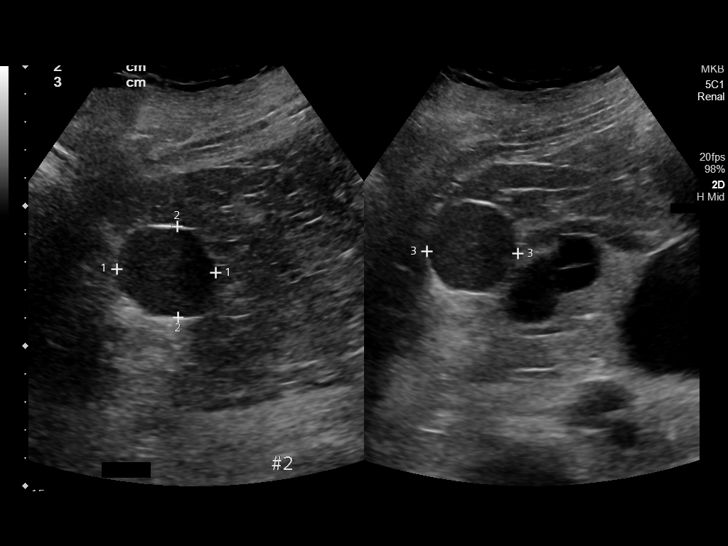
[im 28/67]
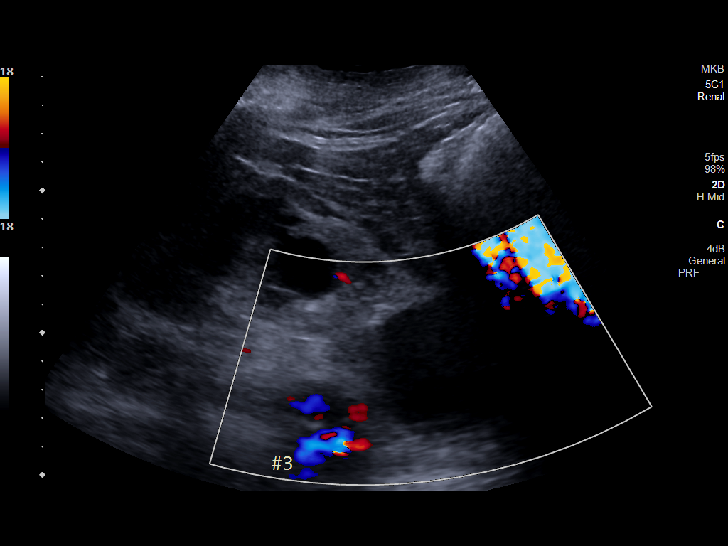
[im 34/67]
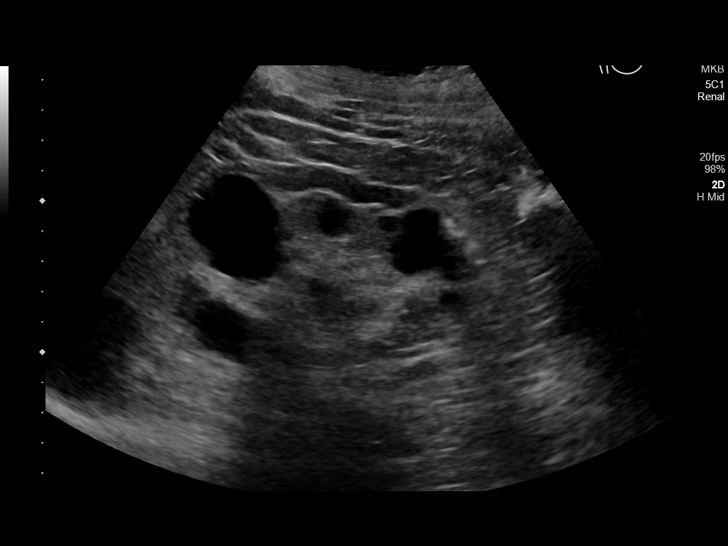
[im 39/67]
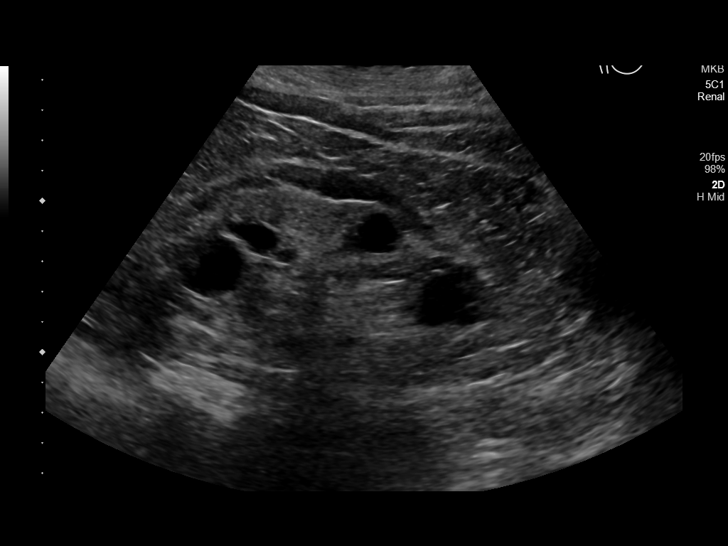
[im 45/67]
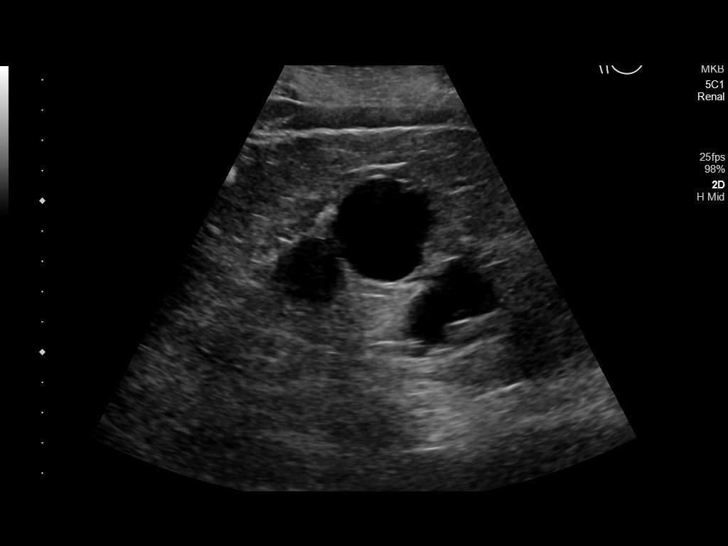
[im 50/67]
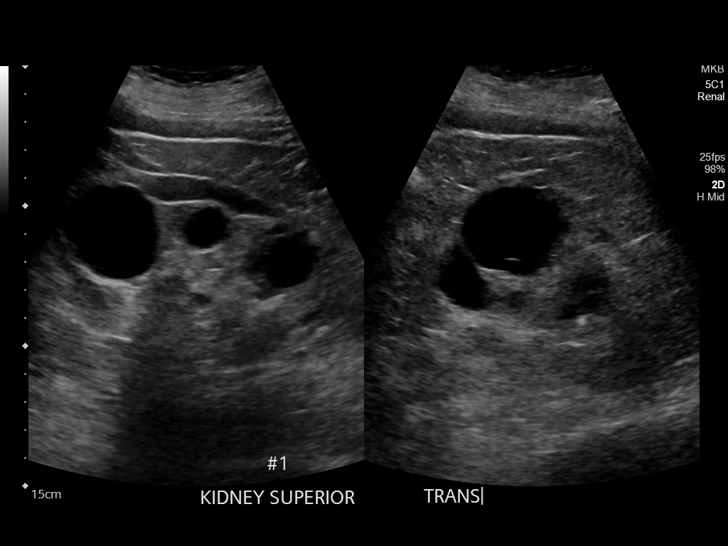
[im 56/67]
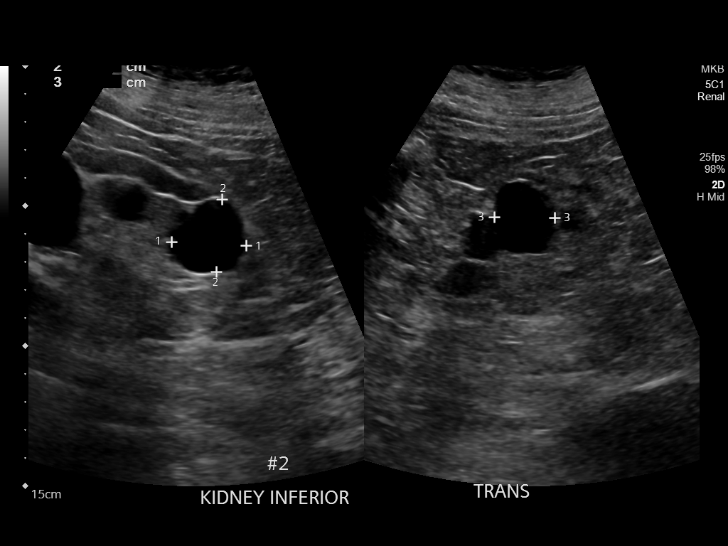
[im 61/67]
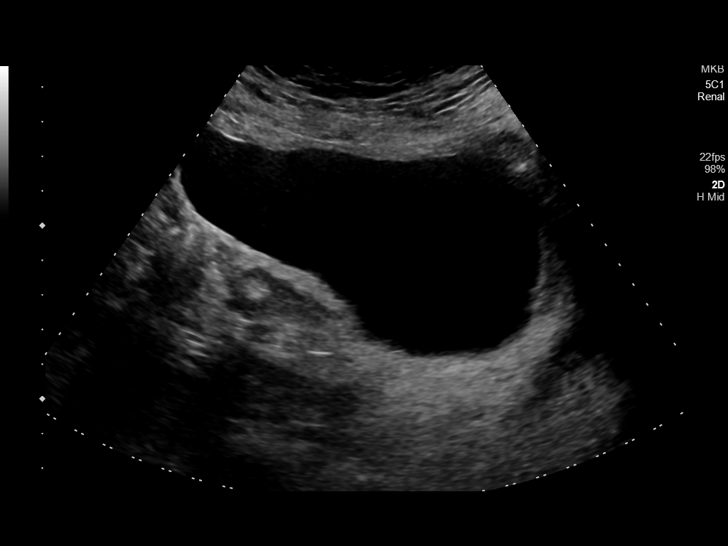
[im 67/67]
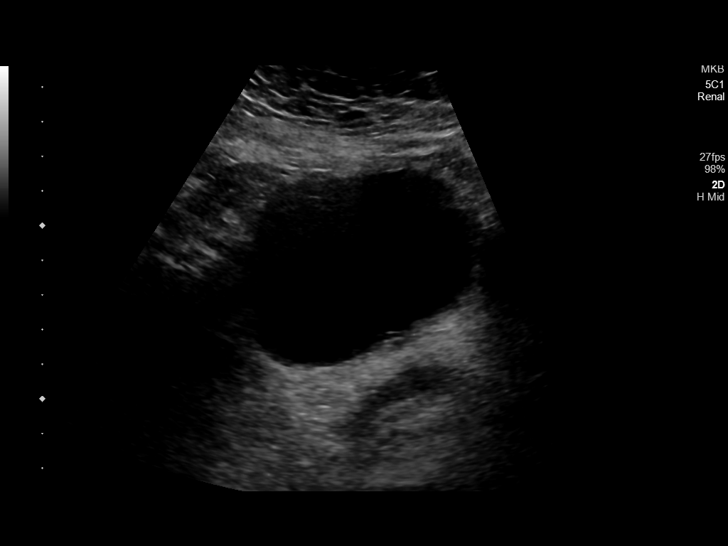

[13 of 25 positions shown; findings below may reference images not displayed]

FINDINGS: Right Kidney:

Renal measurements: 10.1 x 6.3 x 5.7 cm = volume: 190.2 mL.
Increased echogenicity. No hydronephrosis. Multiple benign-appearing
cysts are noted, the largest measures 5.6 cm. A 3.5 cm complex cyst
versus solid lesion is noted along the lower pole of the right
kidney. A tumor can not be excluded. MRI of the kidneys suggested
for further evaluation.

Left Kidney:

Renal measurements: 11.6 x 6.0 x 5.5 cm = volume: 197.6 mL.
Increased echogenicity. Multiple benign-appearing cysts are also
noted in the left kidney. Questionable wall calcifications are noted
in the 3.6 cm cyst in the left upper pole. MRI of the kidneys would
also be useful for further evaluation of this cyst.

Bladder:

Appears normal for degree of bladder distention.

Other:

None.
IMPRESSION: 1. Increased echogenicity both kidneys consistent with chronic
medical renal disease. No hydronephrosis or bladder distention.

2. A 3.5 cm complex cyst versus solid lesion is noted along the
lower pole of the right kidney. Tumor can not be excluded. MRI of
the kidneys suggested for further evaluation.

3. Multiple benign-appearing cysts are noted in both kidneys. A
cm cyst with possible wall calcifications noted in the left upper
renal pole. MRI would also be useful for further evaluation of this
cyst.

## 2020-11-07 DIAGNOSIS — Z0100 Encounter for examination of eyes and vision without abnormal findings: Secondary | ICD-10-CM | POA: Diagnosis not present

## 2020-11-07 DIAGNOSIS — Z961 Presence of intraocular lens: Secondary | ICD-10-CM | POA: Diagnosis not present

## 2020-11-21 ENCOUNTER — Other Ambulatory Visit (HOSPITAL_COMMUNITY): Payer: Self-pay | Admitting: Nephrology

## 2020-11-21 ENCOUNTER — Other Ambulatory Visit: Payer: Self-pay | Admitting: Nephrology

## 2020-11-21 DIAGNOSIS — N2889 Other specified disorders of kidney and ureter: Secondary | ICD-10-CM

## 2020-11-21 DIAGNOSIS — N289 Disorder of kidney and ureter, unspecified: Secondary | ICD-10-CM

## 2020-12-03 ENCOUNTER — Other Ambulatory Visit: Payer: Self-pay

## 2020-12-03 ENCOUNTER — Ambulatory Visit
Admission: RE | Admit: 2020-12-03 | Discharge: 2020-12-03 | Disposition: A | Discharge status: Home / Self Care | Payer: Medicare HMO | Source: Ambulatory Visit | Attending: Nephrology | Admitting: Nephrology

## 2020-12-03 DIAGNOSIS — N289 Disorder of kidney and ureter, unspecified: Secondary | ICD-10-CM | POA: Insufficient documentation

## 2020-12-03 DIAGNOSIS — K449 Diaphragmatic hernia without obstruction or gangrene: Secondary | ICD-10-CM | POA: Diagnosis not present

## 2020-12-03 DIAGNOSIS — K862 Cyst of pancreas: Secondary | ICD-10-CM | POA: Diagnosis not present

## 2020-12-03 DIAGNOSIS — N2889 Other specified disorders of kidney and ureter: Secondary | ICD-10-CM | POA: Diagnosis not present

## 2020-12-03 DIAGNOSIS — K8020 Calculus of gallbladder without cholecystitis without obstruction: Secondary | ICD-10-CM | POA: Diagnosis not present

## 2020-12-03 IMAGING — MR MR ABDOMEN W/O CM
9 series · 48 of 48 positions shown · non-contrast
Comparison: Renal ultrasound [DATE].  PET [DATE].

CLINICAL DATA: Renal ultrasound demonstrating lower pole right and
upper pole left renal indeterminate lesions.

EXAM:
MRI ABDOMEN WITHOUT CONTRAST
TECHNIQUE: Multiplanar multisequence MR imaging was performed without the
administration of intravenous contrast.

[Series 3: T2 · coronal · 6.0mm · 1.19mm/px · 3 of 33 slices shown (1 of 2)]
[im 1/33]
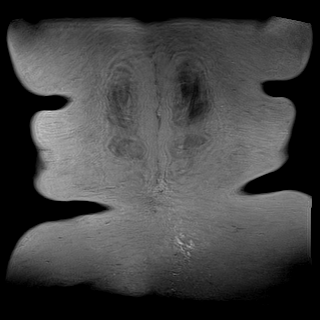
[im 17/33]
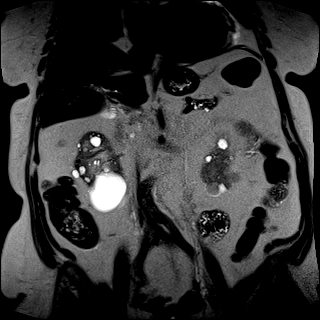
[im 33/33]
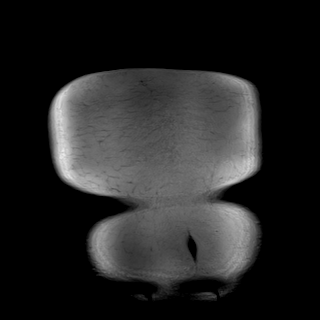

[Series 4: T2 · axial · 6.0mm · 1.19mm/px · z∈[-142,+117]mm · 3 of 37 slices shown (2 of 2)]
[im 1/37]
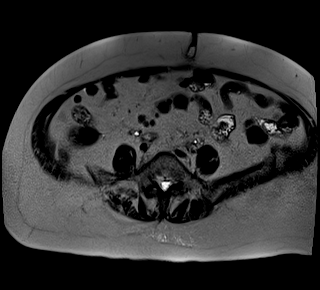
[im 19/37]
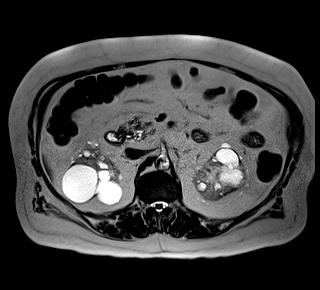
[im 37/37]
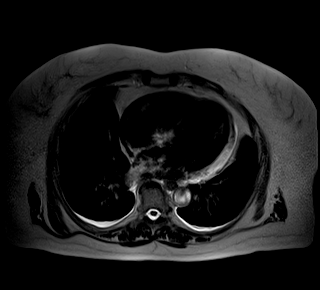

[Series 6: T2 fat-sat · axial · 6.0mm · 1.19mm/px · z∈[-133,+119]mm · 3 of 36 slices shown]
[im 1/36]
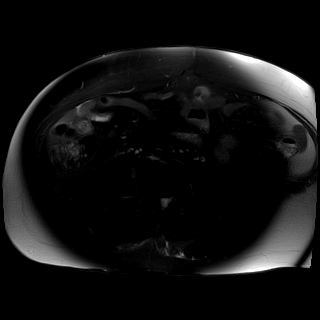
[im 18/36]
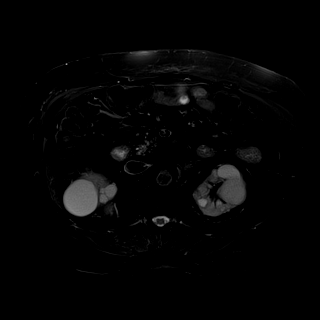
[im 36/36]
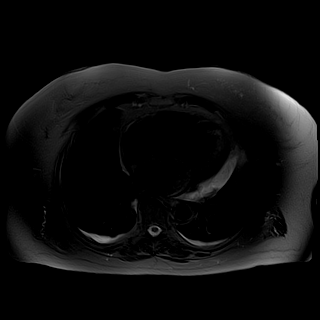

[Series 7: in & out · axial · 3.0mm · 1.19mm/px · z∈[-135,+126]mm · 8 of 88 slices shown (1 of 2)]
[im 1/88]
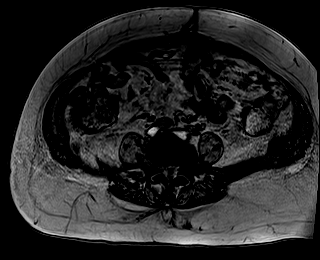
[im 13/88]
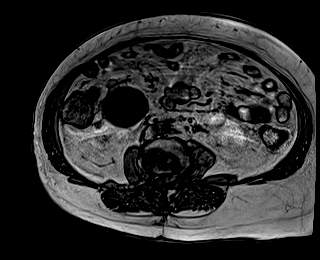
[im 25/88]
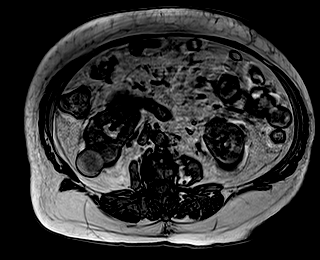
[im 38/88]
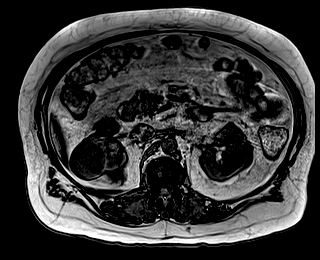
[im 50/88]
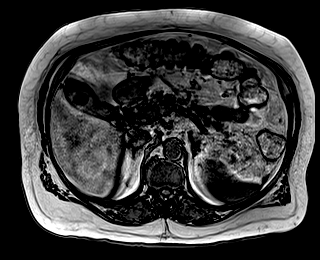
[im 63/88]
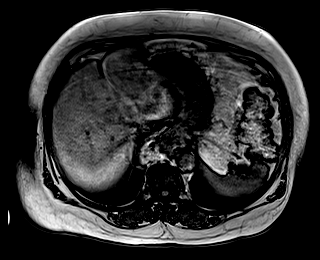
[im 75/88]
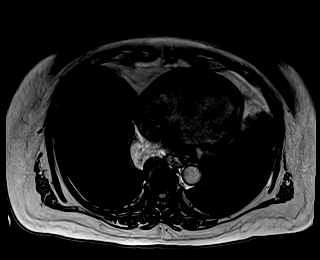
[im 88/88]
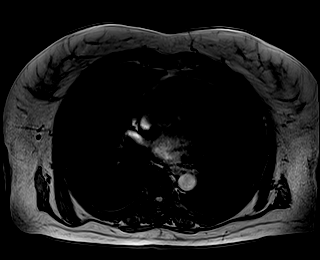

[Series 8: in & out · axial · 3.0mm · 1.19mm/px · z∈[-135,+126]mm · 8 of 88 slices shown (2 of 2)]
[im 1/88]
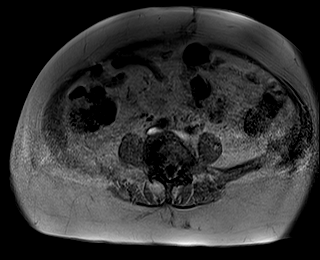
[im 13/88]
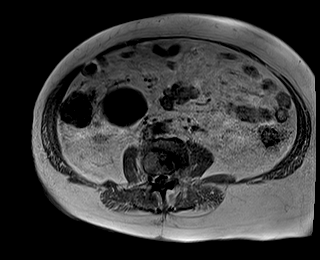
[im 25/88]
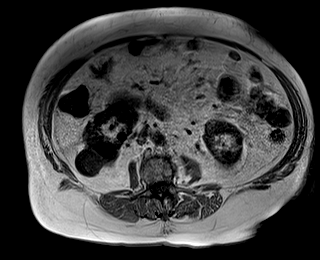
[im 38/88]
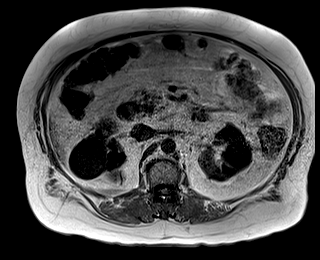
[im 50/88]
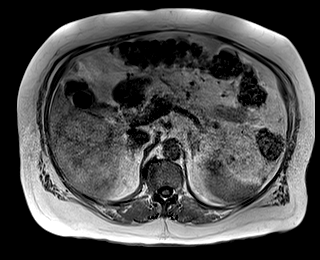
[im 63/88]
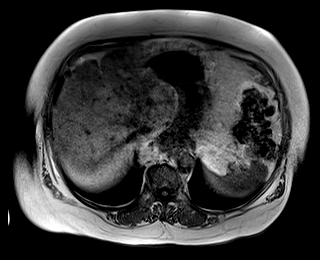
[im 75/88]
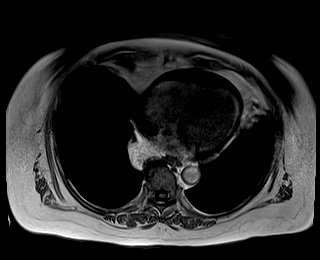
[im 88/88]
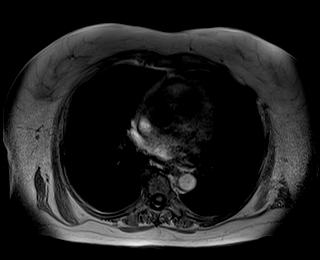

[Series 9: ax dwi_tracew · axial · 6.0mm · 1.42mm/px · z∈[-139,+113]mm · 9 of 108 slices shown]
[im 1/108]
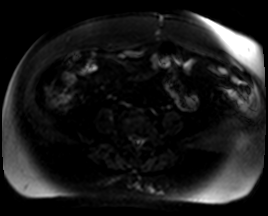
[im 14/108]
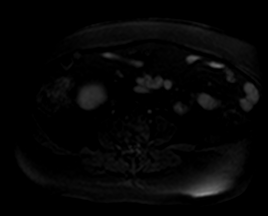
[im 27/108]
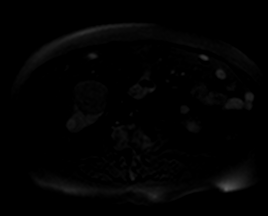
[im 41/108]
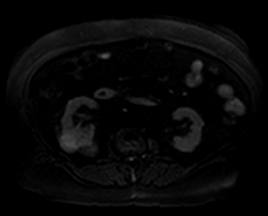
[im 54/108]
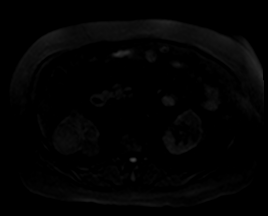
[im 67/108]
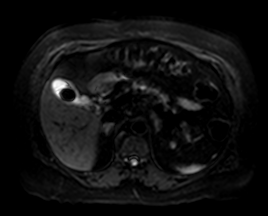
[im 81/108]
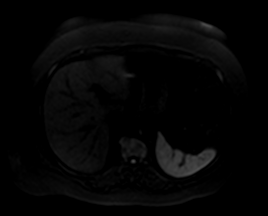
[im 94/108]
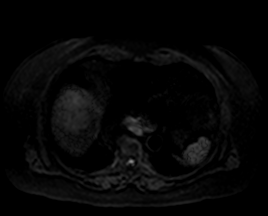
[im 108/108]
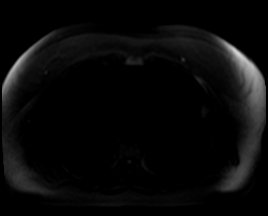

[Series 10: ax dwi_adc · axial · 6.0mm · 1.42mm/px · z∈[-139,+113]mm · 3 of 36 slices shown]
[im 1/36]
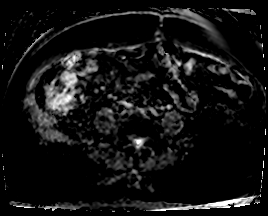
[im 18/36]
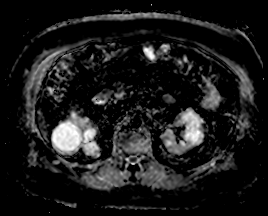
[im 36/36]
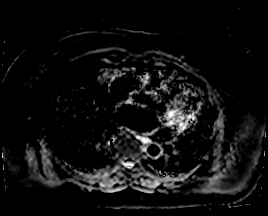

[Series 11: bSSFP · axial · 6.0mm · 0.74mm/px · z∈[-142,+117]mm · 3 of 37 slices shown]
[im 1/37]
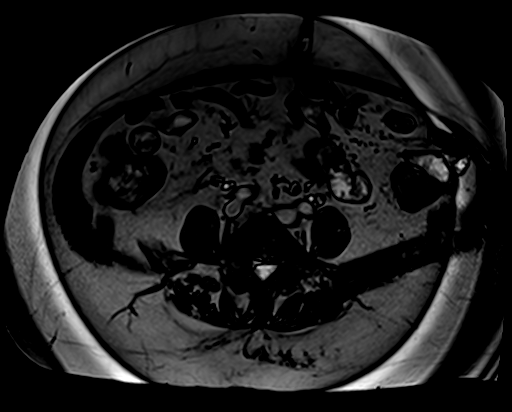
[im 19/37]
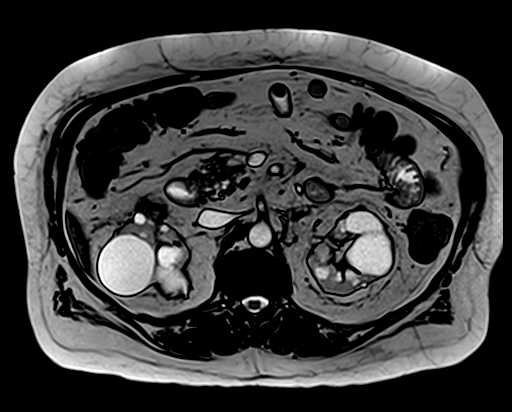
[im 37/37]
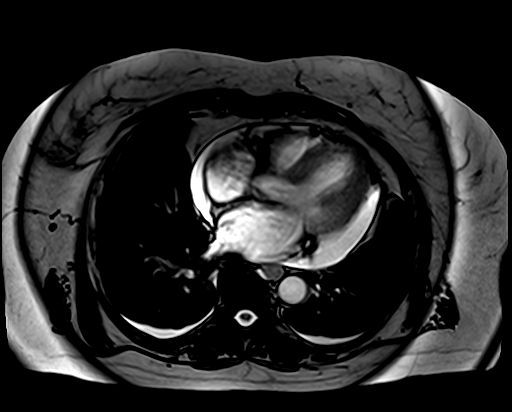

[Series 12: T1 dynamic fat-sat · axial · non-contrast · 3.0mm · 1.19mm/px · z∈[-143,+118]mm · 8 of 88 slices shown]
[im 1/88]
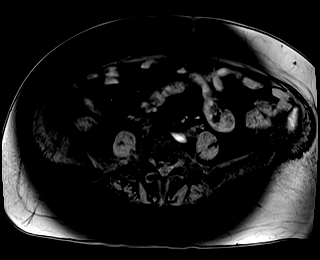
[im 13/88]
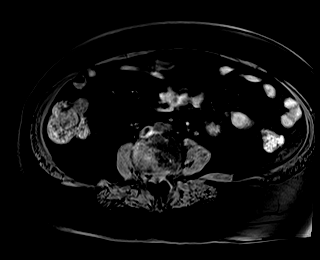
[im 25/88]
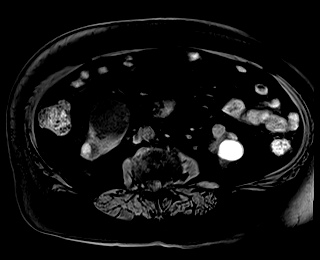
[im 38/88]
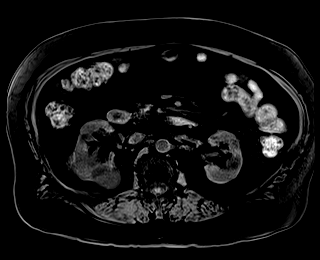
[im 50/88]
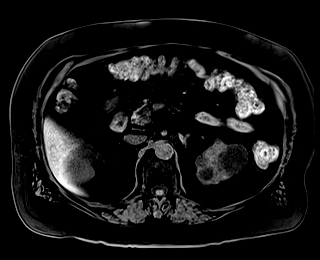
[im 63/88]
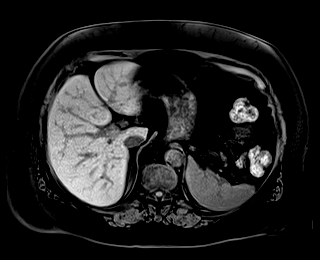
[im 75/88]
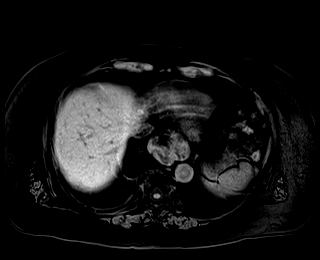
[im 88/88]
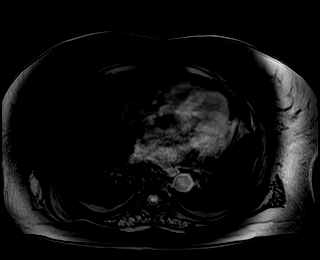

[48 of 48 positions shown; findings below may reference images not displayed]

FINDINGS: Lower chest: Mild cardiomegaly with small pericardial and tiny
bilateral pleural effusions. Moderate hiatal hernia.

Hepatobiliary: Segment 2 subcentimeter hepatic cyst and adjacent too
small to characterize lesion, also likely a cyst.

1.8 cm gallstone without acute cholecystitis or biliary duct
dilatation.

Pancreas: Pancreatic cystic lesions, including at 8 mm within the
pancreatic body on [DATE] and 1.5 cm in the pancreatic head on [DATE].
Underlying pancreatic atrophy without main duct dilatation.
Suggestion of side branch duct ectasia.

Spleen:  Normal in size, without focal abnormality.

Adrenals/Urinary Tract: Normal adrenal glands. Multiple bilateral
renal lesions. Many of these are T2 hyperintense and T1 hypointense,
favoring simple cysts. However, there are multiple complex lesions
which are incompletely characterized on this noncontrast exam.
Example mildly T1 hyperintense lower pole right renal 3.3 cm lesion
on 58/12.

Lower pole left renal markedly T1 hyperintense 2.9 cm lesion on
83/12 with fluid level within on [DATE]. This lesion is most likely a
hemorrhagic/proteinaceous cyst, given the extent of precontrast T1
hyperintensity.

Other smaller lesions which demonstrate complexity as evidenced by
heterogeneous T1 signal, including on series 12. These are all
incompletely characterized without IV contrast.

No hydronephrosis.

Stomach/Bowel: Normal abdominal bowel loops.

Vascular/Lymphatic: Normal aortic caliber. Normal renal vein caliber
bilaterally. No abdominal adenopathy.

Other:  No ascites.

Musculoskeletal: Convex right lumbar spine curvature with
spondylosis.
IMPRESSION: 1. Multiple bilateral renal lesions with complexity. These are
incompletely characterized without IV contrast. These could
represent complex cysts. One or more underlying solid renal
neoplasms cannot be excluded.
2. No findings of abdominal metastatic disease.
3. Tiny bilateral pleural and pericardial effusions, suggesting
fluid overload.
4. Cholelithiasis
5. Moderate hiatal hernia
6. Cystic lesions within the pancreas are most likely pseudocysts.
Indolent cystic neoplasm or neoplasms could look similar. Per
consensus criteria, follow-up with pre and post contrast abdominal
MRI/MRCP at 6 months is recommended. This recommendation follows ACR
consensus guidelines: Management of Incidental Pancreatic Cysts: A
White Paper of the ACR Incidental Findings Committee. [HOSPITAL] [ZW];[DATE].

## 2020-12-08 DIAGNOSIS — E1122 Type 2 diabetes mellitus with diabetic chronic kidney disease: Secondary | ICD-10-CM | POA: Diagnosis not present

## 2020-12-08 DIAGNOSIS — N185 Chronic kidney disease, stage 5: Secondary | ICD-10-CM | POA: Diagnosis not present

## 2020-12-08 DIAGNOSIS — R801 Persistent proteinuria, unspecified: Secondary | ICD-10-CM | POA: Diagnosis not present

## 2020-12-08 DIAGNOSIS — E210 Primary hyperparathyroidism: Secondary | ICD-10-CM | POA: Diagnosis not present

## 2020-12-08 DIAGNOSIS — I10 Essential (primary) hypertension: Secondary | ICD-10-CM | POA: Diagnosis not present

## 2020-12-08 DIAGNOSIS — D631 Anemia in chronic kidney disease: Secondary | ICD-10-CM | POA: Diagnosis not present

## 2020-12-15 DIAGNOSIS — I10 Essential (primary) hypertension: Secondary | ICD-10-CM | POA: Diagnosis not present

## 2020-12-15 DIAGNOSIS — N281 Cyst of kidney, acquired: Secondary | ICD-10-CM | POA: Diagnosis not present

## 2020-12-15 DIAGNOSIS — Q6102 Congenital multiple renal cysts: Secondary | ICD-10-CM | POA: Insufficient documentation

## 2020-12-15 DIAGNOSIS — D631 Anemia in chronic kidney disease: Secondary | ICD-10-CM | POA: Diagnosis not present

## 2020-12-15 DIAGNOSIS — N185 Chronic kidney disease, stage 5: Secondary | ICD-10-CM | POA: Diagnosis not present

## 2021-02-11 DIAGNOSIS — N281 Cyst of kidney, acquired: Secondary | ICD-10-CM | POA: Diagnosis not present

## 2021-02-11 DIAGNOSIS — I10 Essential (primary) hypertension: Secondary | ICD-10-CM | POA: Diagnosis not present

## 2021-02-11 DIAGNOSIS — D631 Anemia in chronic kidney disease: Secondary | ICD-10-CM | POA: Diagnosis not present

## 2021-02-11 DIAGNOSIS — N185 Chronic kidney disease, stage 5: Secondary | ICD-10-CM | POA: Diagnosis not present

## 2021-02-17 ENCOUNTER — Other Ambulatory Visit: Payer: Self-pay | Admitting: Nephrology

## 2021-02-17 DIAGNOSIS — N185 Chronic kidney disease, stage 5: Secondary | ICD-10-CM

## 2021-02-17 DIAGNOSIS — E1122 Type 2 diabetes mellitus with diabetic chronic kidney disease: Secondary | ICD-10-CM | POA: Diagnosis not present

## 2021-02-17 DIAGNOSIS — N184 Chronic kidney disease, stage 4 (severe): Secondary | ICD-10-CM | POA: Diagnosis not present

## 2021-02-17 DIAGNOSIS — I10 Essential (primary) hypertension: Secondary | ICD-10-CM | POA: Diagnosis not present

## 2021-02-17 DIAGNOSIS — Q6102 Congenital multiple renal cysts: Secondary | ICD-10-CM

## 2021-02-17 DIAGNOSIS — N281 Cyst of kidney, acquired: Secondary | ICD-10-CM | POA: Diagnosis not present

## 2021-02-26 ENCOUNTER — Other Ambulatory Visit: Payer: Self-pay

## 2021-02-26 ENCOUNTER — Ambulatory Visit
Admission: RE | Admit: 2021-02-26 | Discharge: 2021-02-26 | Disposition: A | Discharge status: Home / Self Care | Payer: Medicare HMO | Source: Ambulatory Visit | Attending: Nephrology | Admitting: Nephrology

## 2021-02-26 DIAGNOSIS — K449 Diaphragmatic hernia without obstruction or gangrene: Secondary | ICD-10-CM | POA: Diagnosis not present

## 2021-02-26 DIAGNOSIS — N185 Chronic kidney disease, stage 5: Secondary | ICD-10-CM | POA: Diagnosis not present

## 2021-02-26 DIAGNOSIS — Q6102 Congenital multiple renal cysts: Secondary | ICD-10-CM | POA: Diagnosis not present

## 2021-02-26 DIAGNOSIS — N289 Disorder of kidney and ureter, unspecified: Secondary | ICD-10-CM | POA: Diagnosis not present

## 2021-02-26 DIAGNOSIS — K863 Pseudocyst of pancreas: Secondary | ICD-10-CM | POA: Diagnosis not present

## 2021-02-26 DIAGNOSIS — K8020 Calculus of gallbladder without cholecystitis without obstruction: Secondary | ICD-10-CM | POA: Diagnosis not present

## 2021-02-26 IMAGING — MR MR ABDOMEN W/O CM
12 of 15 series · 42 of 48 positions shown · non-contrast
Comparison: Abdominal MRI [DATE].

CLINICAL DATA: 80-year-old female with history of multiple
bilateral renal lesions. Follow-up study.

EXAM:
MRI ABDOMEN WITHOUT CONTRAST
TECHNIQUE: Multiplanar multisequence MR imaging was performed without the
administration of intravenous contrast.

[Series 3: cor haste · coronal · 6.0mm · 1.19mm/px · 2 of 32 slices shown]
[im 1/32]
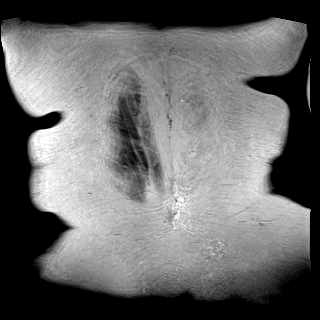
[im 32/32]
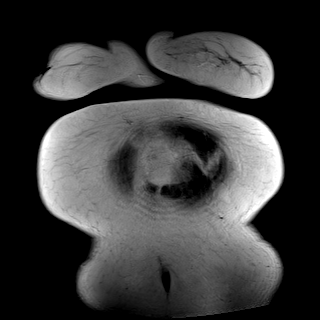

[Series 6: T2 fat-sat · axial · 6.0mm · 1.19mm/px · z∈[-152,+100]mm · 2 of 36 slices shown]
[im 1/36]
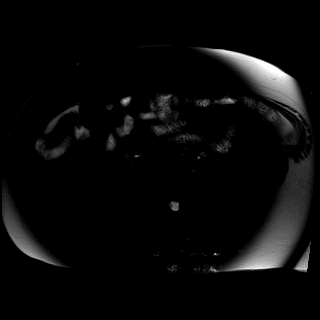
[im 36/36]
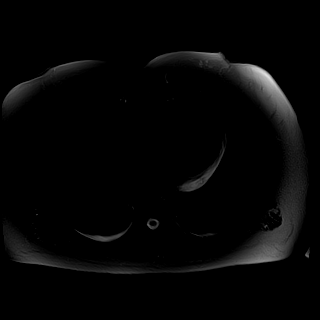

[Series 7: T2 · axial · 6.0mm · 1.19mm/px · z∈[-142,+125]mm · 2 of 38 slices shown]
[im 1/38]
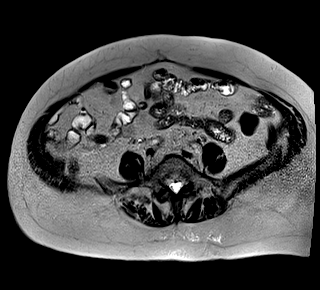
[im 38/38]
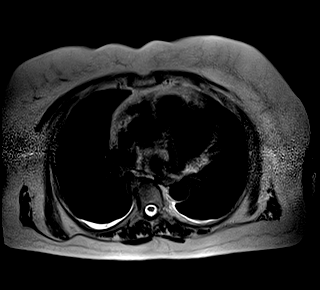

[Series 8: DWI · axial · 6.0mm · 1.42mm/px · z∈[-148,+104]mm · 7 of 108 slices shown (1 of 2)]
[im 1/108]
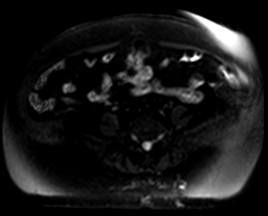
[im 18/108]
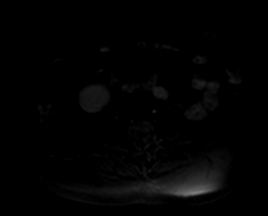
[im 36/108]
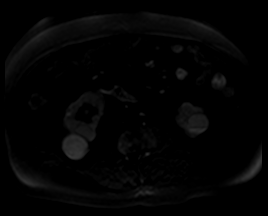
[im 54/108]
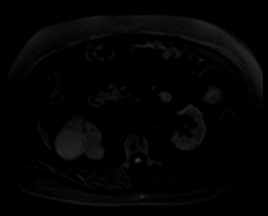
[im 72/108]
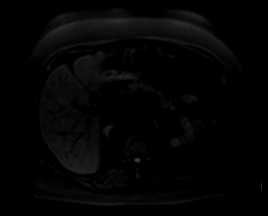
[im 90/108]
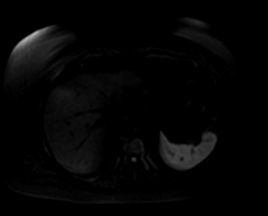
[im 108/108]
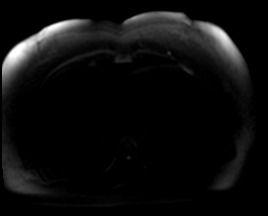

[Series 9: DWI · axial · 6.0mm · 1.42mm/px · z∈[-148,+104]mm · 2 of 36 slices shown (2 of 2)]
[im 1/36]
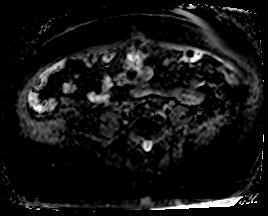
[im 36/36]
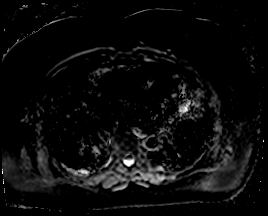

[Series 10: MRCP · coronal · 3.0mm · 1.12mm/px · 1 of 17 slices shown]
[im 1/17]
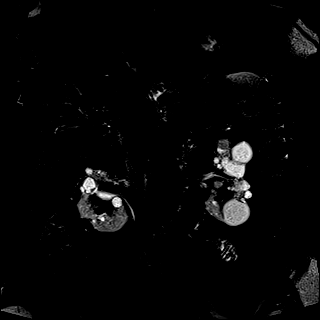

[Series 11: in & out · axial · 3.0mm · 1.19mm/px · z∈[-139,+122]mm · 6 of 88 slices shown (1 of 2)]
[im 1/88]
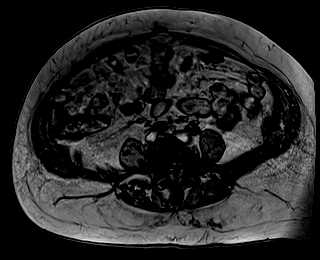
[im 18/88]
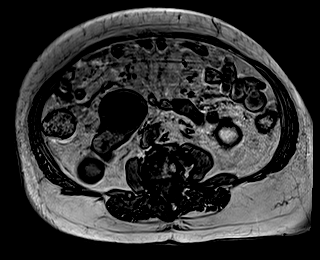
[im 35/88]
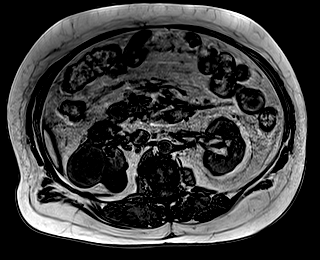
[im 53/88]
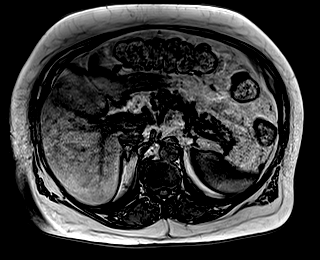
[im 70/88]
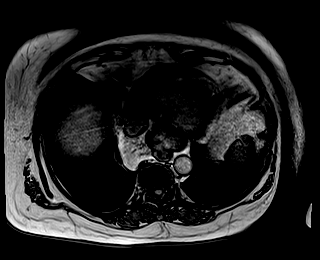
[im 88/88]
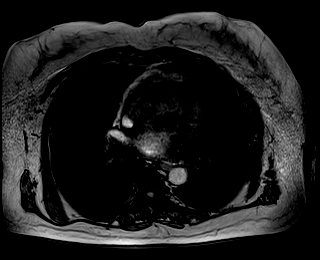

[Series 12: in & out · axial · 3.0mm · 1.19mm/px · z∈[-139,+119]mm · 6 of 87 slices shown (2 of 2)]
[im 1/87]
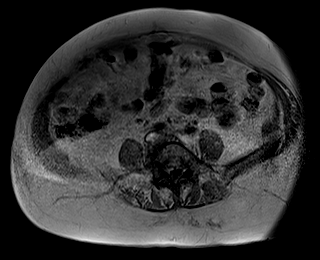
[im 18/87]
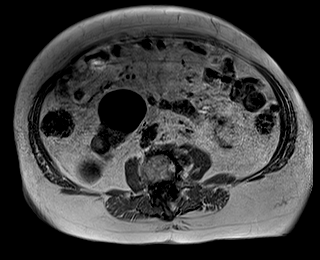
[im 35/87]
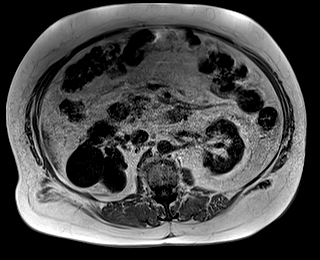
[im 52/87]
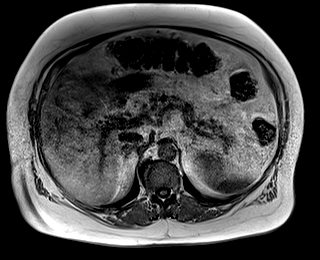
[im 69/87]
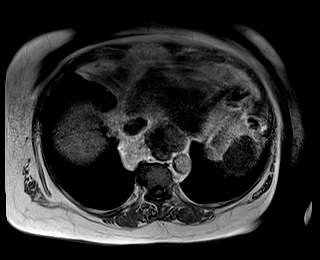
[im 87/87]
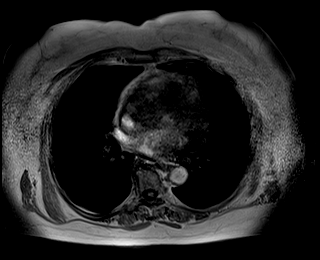

[Series 16: bSSFP · axial · 6.0mm · 0.74mm/px · z∈[-131,+114]mm · 2 of 35 slices shown]
[im 1/35]
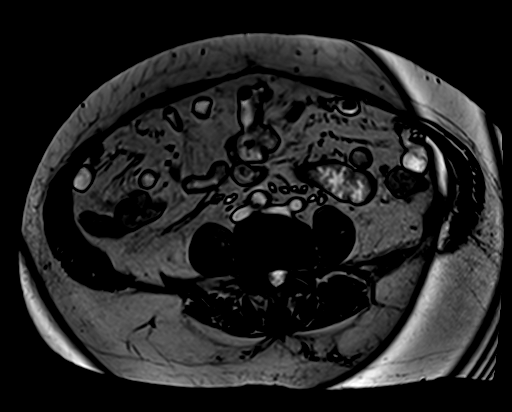
[im 35/35]
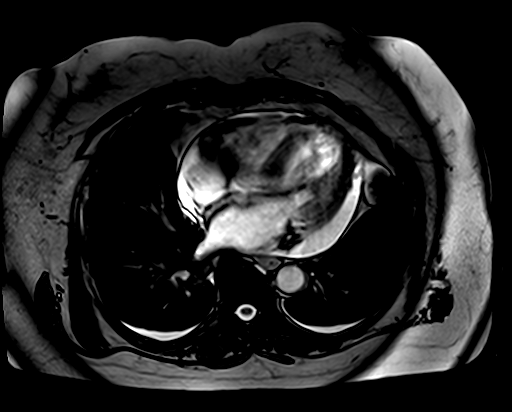

[Series 17: T1 dynamic · axial · non-contrast · 3.0mm · 1.19mm/px · z∈[-139,+122]mm · 6 of 88 slices shown (1 of 2)]
[im 1/88]
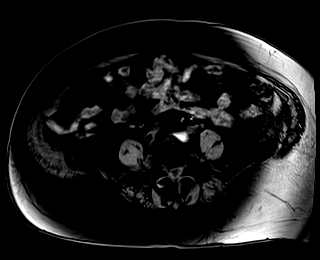
[im 18/88]
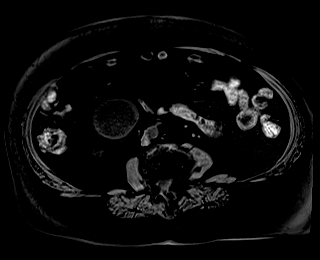
[im 35/88]
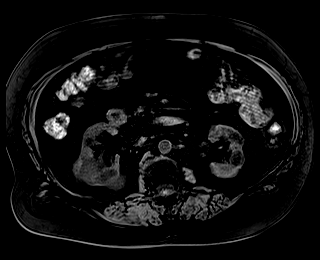
[im 53/88]
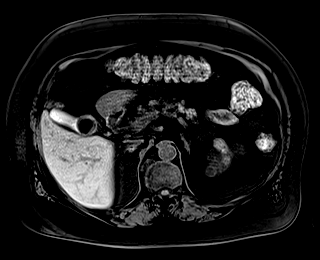
[im 70/88]
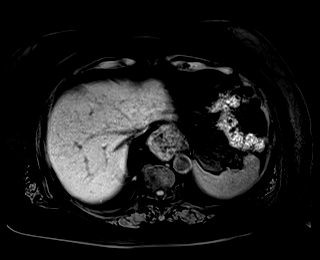
[im 88/88]
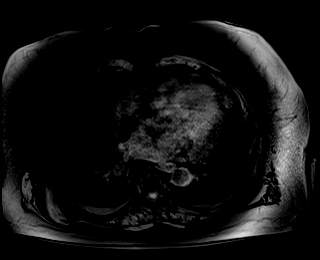

[Series 18: T1 dynamic · coronal · 3.0mm · 1.31mm/px · 5 of 80 slices shown (2 of 2)]
[im 1/80]
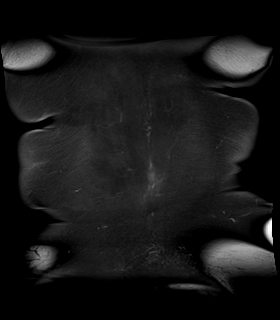
[im 20/80]
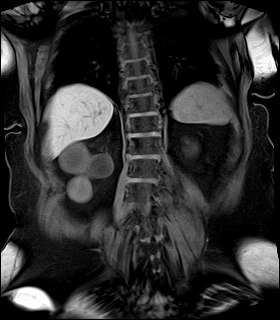
[im 40/80]
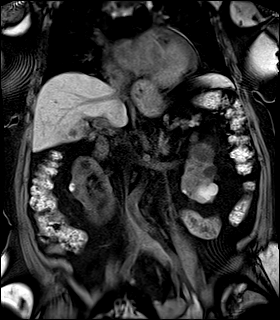
[im 60/80]
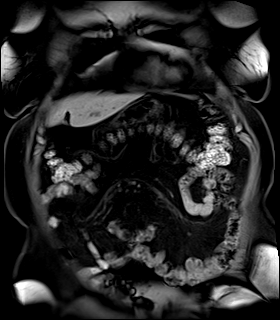
[im 80/80]
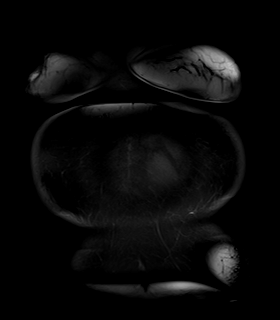

[Series 19: radials · coronal · 50.0mm · 0.78mm/px · 1 of 5 slices shown]
[im 1/5]
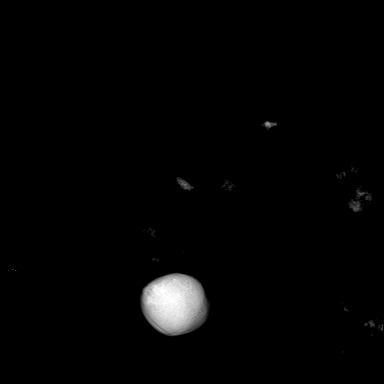

[42 of 48 positions shown; findings below may reference images not displayed]

FINDINGS: Comment: Today's study is limited for detection of visceral and/or
vascular lesions by lack of IV gadolinium.

Lower chest: Trace amount of pericardial fluid. Trace right pleural
effusion lying dependently. Moderate-sized hiatal hernia.

Hepatobiliary: Subcentimeter T1 hypointense, T2 hyperintense lesions
are again noted in the left lobe of the liver, incompletely
characterized on today's noncontrast examination, but statistically
likely to represent tiny cysts and/or biliary hamartomas. No intra
or extrahepatic biliary ductal dilatation. Large filling defect in
the gallbladder measuring 2.7 cm in diameter, compatible with a
gallstone. Gallbladder is nearly decompressed. No gallbladder wall
thickening or pericholecystic fluid or surrounding inflammatory
changes.

Pancreas: Pancreatic atrophy. Innumerable tiny T1 hypointense, T2
hyperintense lesions are scattered throughout the pancreas, similar
to the prior examination, with the largest of these in the medial
aspect of the pancreatic head measuring 1.3 cm in diameter. No
peripancreatic inflammatory changes or larger peripancreatic fluid
collections.

Spleen:  Unremarkable.

Adrenals/Urinary Tract: Again noted are numerous renal lesions of
varying degrees of complexity ranging from T1 hypointense and T2
hyperintense, too T1 hyperintense and T2 hypointense, likely to
reflect a combination of simple and proteinaceous/hemorrhagic cysts,
but incompletely characterized on today's noncontrast examination.
Overall, these are similar in number and size compared to the prior
examination. The most conspicuous lesion is an exophytic lesion in
the posterior aspect of the lower pole of the right kidney (axial
image 27 of series 7) measuring 3.5 x 3.3 cm which is heterogeneous
in signal intensity on T2 weighted images and T1 isointense, but
larger than the prior examination (previously 3.4 x 2.7 cm). The
largest simple appearing cystic lesion is exophytic in the lower
pole of the right kidney measuring 5.2 cm in diameter (axial image
30 of series 7). The largest lesion that appears likely to represent
a peer proteinaceous/hemorrhagic cyst is in the lower pole of the
left kidney (axial image 63 of series 17) measuring 3.2 cm in
diameter. No hydroureteronephrosis in the visualized portions of the
abdomen. Bilateral adrenal glands are normal in appearance.

Stomach/Bowel: Visualized portions are unremarkable.

Vascular/Lymphatic: No aneurysm identified in the visualized
abdominal vasculature. No lymphadenopathy noted in the abdomen.

Other: No significant volume of ascites noted in the visualized
portions of the peritoneal cavity.

Musculoskeletal: No aggressive appearing osseous lesions are noted
in the visualized portions of the skeleton.
IMPRESSION: 1. Numerous renal lesions of varying degrees of complexity again
noted, many of which are stable compared to the prior study. These
are poorly evaluated on today's noncontrast examination, however,
there is one lesion in the posterior aspect of the lower pole of the
right kidney which is more complex than the others in terms of
internal architecture, and demonstrates growth compared to the prior
study, concerning for renal neoplasm. Outpatient referral to Urology
for surgical evaluation is recommended. Any future follow-up study
should be performed with and without IV gadolinium unless there is a
contraindication.
2. Multiple small cystic lesions scattered throughout the pancreas,
likely to represent tiny pancreatic pseudocysts or intraductal
papillary mucinous neoplasm (IPMN). Overall, this is very similar to
the prior study. As previously recommended, repeat abdominal MRI
with and without IV gadolinium with MRCP is recommended in 6 months
to ensure the stability of these lesions. This recommendation
follows ACR consensus guidelines: Management of Incidental
Pancreatic Cysts: A White Paper of the ACR Incidental Findings
Committee. [HOSPITAL] [78];[DATE].
3. Cholelithiasis.
4. Moderate-sized hiatal hernia.
5. Tiny right pleural effusion.
6. Trace amount of pericardial fluid.

## 2021-02-27 ENCOUNTER — Encounter: Payer: Self-pay | Admitting: Surgery

## 2021-02-27 ENCOUNTER — Telehealth: Payer: Self-pay

## 2021-02-27 ENCOUNTER — Ambulatory Visit: Payer: Medicare HMO | Admitting: Surgery

## 2021-02-27 VITALS — BP 117/73 | HR 90 | Temp 97.8°F | Ht 59.0 in | Wt 192.4 lb

## 2021-02-27 DIAGNOSIS — N185 Chronic kidney disease, stage 5: Secondary | ICD-10-CM

## 2021-02-27 DIAGNOSIS — Z4901 Encounter for fitting and adjustment of extracorporeal dialysis catheter: Secondary | ICD-10-CM

## 2021-02-27 DIAGNOSIS — N186 End stage renal disease: Secondary | ICD-10-CM | POA: Diagnosis not present

## 2021-02-27 DIAGNOSIS — K449 Diaphragmatic hernia without obstruction or gangrene: Secondary | ICD-10-CM | POA: Diagnosis not present

## 2021-02-27 NOTE — Progress Notes (Signed)
02/27/2021  Reason for Visit: Peritoneal dialysis catheter placement  Requesting Provider:  Murlean Iba, MD  History of Present Illness: Laura Meyer is a 81 y.o. female presenting for evaluation of her peritoneal dialysis catheter placement.  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  Patient reports that overall she is doing well and denies any abdominal pain, nausea, vomiting.  Denies any prior abdominal surgeries.  She has been followed up for bilateral renal cysts and had an MRI done on 12/03/2020 and a repeat MRI done yesterday 02/26/2021.  The first MRI showed multiple bilateral renal lesions with complexity which could represent complex cysts.  She was also found to have moderate hiatal hernia and cholelithiasis.  Follow-up MRI has not been read yet by radiology.  Patient denies any weight loss, chest pain, shortness of breath.  Past Medical History: Past Medical History:  Diagnosis Date   Arthritis    hands   CKD (chronic kidney disease), stage IV (HCC)    Hypertension    Vertigo 2019   2 episodes.  none in over 1 yr.     Past Surgical History: Past Surgical History:  Procedure Laterality Date   CATARACT EXTRACTION W/PHACO Left 06/28/2018   Procedure: CATARACT EXTRACTION PHACO AND INTRAOCULAR LENS PLACEMENT ([place])  LEFT;  Surgeon: Leandrew Koyanagi, MD;  Location: [facility];  Service: Ophthalmology;  Laterality: Left;   CATARACT EXTRACTION W/PHACO Right 07/19/2018   Procedure: CATARACT EXTRACTION PHACO AND INTRAOCULAR LENS PLACEMENT ([place]) RIGHT;  Surgeon: Leandrew Koyanagi, MD;  Location: [facility];  Service: Ophthalmology;  Laterality: Right;   DILATION AND CURETTAGE OF UTERUS     PARATHYROIDECTOMY     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 Authorizing Provider  allopurinol (ZYLOPRIM) 100 MG tablet Take 100 mg by mouth daily.   Yes [provider]  amLODipine (NORVASC) 5 MG tablet Take 5 mg by mouth daily.   Yes [provider]  aspirin 81 MG tablet Take 81 mg by mouth daily.   Yes [provider]  carvedilol (COREG) 12.5 MG tablet Take 12.5 mg by mouth 2 (two) times daily with a meal.   Yes [provider]  losartan-hydrochlorothiazide (HYZAAR) 100-12.5 MG tablet Take 1 tablet by mouth daily.   Yes [provider]  simvastatin (ZOCOR) 20 MG tablet Take 20 mg by mouth daily.   Yes [provider]    Allergies: Allergies  Allergen Reactions   Contrast Media [Iodinated Contrast Media]     Avoids due to kidney issues    Social History:  reports that she has never smoked. She has never used smokeless tobacco. She reports that she does not currently use alcohol. No history on file for drug use.   Family History: History reviewed. No pertinent family history.  Review of Systems: Review of Systems  Constitutional:  Negative for chills and fever.  HENT:  Negative for hearing loss.   Respiratory:  Negative for shortness of breath.   Cardiovascular:  Negative for chest pain.  Gastrointestinal:  Negative for abdominal pain, nausea and vomiting.  Genitourinary:  Negative for dysuria.  Musculoskeletal:  Negative for myalgias.  Skin:  Negative for rash.  Neurological:  Negative for dizziness.  Psychiatric/Behavioral:  Negative for depression.    Physical Exam BP 117/73    Pulse 90    Temp 97.8 F (36.6 C) (Oral)    Ht 4\' 11"  (1.499 m)    Wt 192 lb 6.4 oz (87.3 kg)    SpO2 98%    BMI 38.86 kg/m  CONSTITUTIONAL: No acute distress, well-nourished HEENT:  Normocephalic, atraumatic, extraocular motion intact. NECK: Trachea is midline, and there is no jugular venous distension.  RESPIRATORY:  Lungs are clear, and breath sounds are  equal bilaterally. Normal respiratory effort without pathologic use of accessory muscles. CARDIOVASCULAR: Heart is regular without murmurs, gallops, or rubs. GI: The abdomen is soft, nondistended, nontender to palpation.  There is no palpable umbilical hernia or bilateral inguinal hernia.  MUSCULOSKELETAL:  Normal muscle strength and tone in all four extremities.  No peripheral edema or cyanosis. SKIN: Skin turgor is normal. There are no pathologic skin lesions.  NEUROLOGIC:  Motor and sensation is grossly normal.  Cranial nerves are grossly intact. PSYCH:  Alert and oriented to person, place and time. Affect is normal.  Laboratory Analysis: Labs from 02/11/2021: Sodium 138, potassium 4.5, chloride 108, CO2 20, BUN 90, creatinine 3.98.  WBC 5.3, hemoglobin 9, hematocrit 27.9, platelets 210  Imaging: MRI abdomen without contrast on 12/03/2020: IMPRESSION: 1. Multiple bilateral renal lesions with complexity. These are incompletely characterized without IV contrast. These could represent complex cysts. One or more underlying solid renal neoplasms cannot be excluded. 2. No findings of abdominal metastatic disease. 3. Tiny bilateral pleural and pericardial effusions, suggesting fluid overload. 4. Cholelithiasis 5. Moderate hiatal hernia 6. 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 Indolent cystic neoplasm or neoplasms could look similar. Per consensus criteria, follow-up with pre and post contrast abdominal MRI/MRCP at 6 months is recommended. This recommendation  follows ACR consensus guidelines: Management of Incidental Pancreatic Cysts: A White Paper of the ACR Incidental Findings Committee. [place] 4627;03:500-938.    Assessment and Plan: This is a 81 y.o. female with stage V chronic kidney disease, now approaching the need for dialysis.  - The patient has had a thorough discussion already with the nephrology team and has opted for peritoneal dialysis at home.   I discussed with her more about the peritoneal dialysis catheter placement itself and reviewed with her the surgery that would be a laparoscopic assisted PD catheter placement with possible omentopexy.  Reviewed with her the surgery at length including the risks of bleeding, infection, injury to surrounding structures, that this an outpatient procedure, activity restrictions after surgery, postoperative drain care with the [facility] team, and she is willing to proceed. - The only issue that discuss further with the patient is the hiatal hernia the patient was found on MRI.  The patient reports that she was unaware of this hiatal hernia prior to the MRI done a few months ago and has otherwise been asymptomatic.  She denies any heartburn or GERD symptoms or upper chest pain.  Discussed with her that typically when a PD catheter is being placed, we do want to concomitantly repair any abdominal wall hernias.  However it is unclear to me if the hiatal hernia would have to be repaired at the same time, particularly if she is asymptomatic.  I will ask Dr. Stan Head, who is the chair of [employer] for surgeons in [place] his thoughts on whether this hiatal hernia would need to be repaired at the same time.  I discussed with the patient that if indeed  she wants repair of the hiatal hernia, that I would refer her to my partner Dr. Dahlia Byes for further evaluation.  Otherwise if no concomitant repair is needed, then we will proceed with laparoscopic PD catheter placement alone. - Tentatively, we will schedule patient for 03/12/2021.  All questions have been answered.  I spent 80 minutes dedicated to the care of this patient on the date of this encounter to include pre-visit review of records, face-to-face time with the patient discussing diagnosis and management, and any post-visit coordination of care.   Melvyn Neth, [facility]

## 2021-02-27 NOTE — Telephone Encounter (Signed)
Medical Clearance faxed to Dr.Marshall Ouida Sills at this time.

## 2021-02-27 NOTE — H&P (View-Only) (Signed)
02/27/2021  Reason for Visit: Peritoneal dialysis catheter placement  Requesting Provider:  Murlean Iba, MD  History of Present Illness: Laura Meyer is a 81 y.o. female presenting for evaluation of her peritoneal dialysis catheter placement.  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  Patient reports that overall she is doing well and denies any abdominal pain, nausea, vomiting.  Denies any prior abdominal surgeries.  She has been followed up for bilateral renal cysts and had an MRI done on 12/03/2020 and a repeat MRI done yesterday 02/26/2021.  The first MRI showed multiple bilateral renal lesions with complexity which could represent complex cysts.  She was also found to have moderate hiatal hernia and cholelithiasis.  Follow-up MRI has not been read yet by radiology.  Patient denies any weight loss, chest pain, shortness of breath.  Past Medical History: Past Medical History:  Diagnosis Date   Arthritis    hands   CKD (chronic kidney disease), stage IV (HCC)    Hypertension    Vertigo 2019   2 episodes.  none in over 1 yr.     Past Surgical History: Past Surgical History:  Procedure Laterality Date   CATARACT EXTRACTION W/PHACO Left 06/28/2018   Procedure: CATARACT EXTRACTION PHACO AND INTRAOCULAR LENS PLACEMENT ([place])  LEFT;  Surgeon: Leandrew Koyanagi, MD;  Location: [facility];  Service: Ophthalmology;  Laterality: Left;   CATARACT EXTRACTION W/PHACO Right 07/19/2018   Procedure: CATARACT EXTRACTION PHACO AND INTRAOCULAR LENS PLACEMENT ([place]) RIGHT;  Surgeon: Leandrew Koyanagi, MD;  Location: [facility];  Service: Ophthalmology;  Laterality: Right;   DILATION AND CURETTAGE OF UTERUS     PARATHYROIDECTOMY     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 Authorizing Provider  allopurinol (ZYLOPRIM) 100 MG tablet Take 100 mg by mouth daily.   Yes [provider]  amLODipine (NORVASC) 5 MG tablet Take 5 mg by mouth daily.   Yes [provider]  aspirin 81 MG tablet Take 81 mg by mouth daily.   Yes [provider]  carvedilol (COREG) 12.5 MG tablet Take 12.5 mg by mouth 2 (two) times daily with a meal.   Yes [provider]  losartan-hydrochlorothiazide (HYZAAR) 100-12.5 MG tablet Take 1 tablet by mouth daily.   Yes [provider]  simvastatin (ZOCOR) 20 MG tablet Take 20 mg by mouth daily.   Yes [provider]    Allergies: Allergies  Allergen Reactions   Contrast Media [Iodinated Contrast Media]     Avoids due to kidney issues    Social History:  reports that she has never smoked. She has never used smokeless tobacco. She reports that she does not currently use alcohol. No history on file for drug use.   Family History: History reviewed. No pertinent family history.  Review of Systems: Review of Systems  Constitutional:  Negative for chills and fever.  HENT:  Negative for hearing loss.   Respiratory:  Negative for shortness of breath.   Cardiovascular:  Negative for chest pain.  Gastrointestinal:  Negative for abdominal pain, nausea and vomiting.  Genitourinary:  Negative for dysuria.  Musculoskeletal:  Negative for myalgias.  Skin:  Negative for rash.  Neurological:  Negative for dizziness.  Psychiatric/Behavioral:  Negative for depression.    Physical Exam BP 117/73    Pulse 90    Temp 97.8 F (36.6 C) (Oral)    Ht 4\' 11"  (1.499 m)    Wt 192 lb 6.4 oz (87.3 kg)    SpO2 98%    BMI 38.86 kg/m  CONSTITUTIONAL: No acute distress, well-nourished HEENT:  Normocephalic, atraumatic, extraocular motion intact. NECK: Trachea is midline, and there is no jugular venous distension.  RESPIRATORY:  Lungs are clear, and breath sounds are  equal bilaterally. Normal respiratory effort without pathologic use of accessory muscles. CARDIOVASCULAR: Heart is regular without murmurs, gallops, or rubs. GI: The abdomen is soft, nondistended, nontender to palpation.  There is no palpable umbilical hernia or bilateral inguinal hernia.  MUSCULOSKELETAL:  Normal muscle strength and tone in all four extremities.  No peripheral edema or cyanosis. SKIN: Skin turgor is normal. There are no pathologic skin lesions.  NEUROLOGIC:  Motor and sensation is grossly normal.  Cranial nerves are grossly intact. PSYCH:  Alert and oriented to person, place and time. Affect is normal.  Laboratory Analysis: Labs from 02/11/2021: Sodium 138, potassium 4.5, chloride 108, CO2 20, BUN 90, creatinine 3.98.  WBC 5.3, hemoglobin 9, hematocrit 27.9, platelets 210  Imaging: MRI abdomen without contrast on 12/03/2020: IMPRESSION: 1. Multiple bilateral renal lesions with complexity. These are incompletely characterized without IV contrast. These could represent complex cysts. One or more underlying solid renal neoplasms cannot be excluded. 2. No findings of abdominal metastatic disease. 3. Tiny bilateral pleural and pericardial effusions, suggesting fluid overload. 4. Cholelithiasis 5. Moderate hiatal hernia 6. 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 Indolent cystic neoplasm or neoplasms could look similar. Per consensus criteria, follow-up with pre and post contrast abdominal MRI/MRCP at 6 months is recommended. This recommendation  follows ACR consensus guidelines: Management of Incidental Pancreatic Cysts: A White Paper of the ACR Incidental Findings Committee. [place] 2694;85:462-703.    Assessment and Plan: This is a 81 y.o. female with stage V chronic kidney disease, now approaching the need for dialysis.  - The patient has had a thorough discussion already with the nephrology team and has opted for peritoneal dialysis at home.   I discussed with her more about the peritoneal dialysis catheter placement itself and reviewed with her the surgery that would be a laparoscopic assisted PD catheter placement with possible omentopexy.  Reviewed with her the surgery at length including the risks of bleeding, infection, injury to surrounding structures, that this an outpatient procedure, activity restrictions after surgery, postoperative drain care with the [facility] team, and she is willing to proceed. - The only issue that discuss further with the patient is the hiatal hernia the patient was found on MRI.  The patient reports that she was unaware of this hiatal hernia prior to the MRI done a few months ago and has otherwise been asymptomatic.  She denies any heartburn or GERD symptoms or upper chest pain.  Discussed with her that typically when a PD catheter is being placed, we do want to concomitantly repair any abdominal wall hernias.  However it is unclear to me if the hiatal hernia would have to be repaired at the same time, particularly if she is asymptomatic.  I will ask Dr. Stan Head, who is the chair of [employer] for surgeons in [place] his thoughts on whether this hiatal hernia would need to be repaired at the same time.  I discussed with the patient that if indeed  she wants repair of the hiatal hernia, that I would refer her to my partner Dr. Dahlia Byes for further evaluation.  Otherwise if no concomitant repair is needed, then we will proceed with laparoscopic PD catheter placement alone. - Tentatively, we will schedule patient for 03/12/2021.  All questions have been answered.  I spent 80 minutes dedicated to the care of this patient on the date of this encounter to include pre-visit review of records, face-to-face time with the patient discussing diagnosis and management, and any post-visit coordination of care.   Melvyn Neth, [facility]

## 2021-02-27 NOTE — Patient Instructions (Signed)
Our surgery scheduler Pamala Hurry will call you within 24-48 hours to get you scheduled. If you have not heard from her after 48 hours, please call our office. You will not need to get Covid tested before surgery and have the blue sheet available when she calls to write down important information.  If you have any concerns or questions, please feel free to call our office.   Peritoneal Dialysis Catheter Placement Peritoneal dialysis catheter placement is a surgery to insert a thin, flexible tube (catheter) into the abdomen. The catheter will be used for peritoneal dialysis, which is a process for filtering the blood. The catheter is small, soft, and easy to conceal. The catheter placement is usually done at least 2 weeks before peritoneal dialysis is started. During dialysis, wastes, salt, and extra water are removed from the blood. In peritoneal dialysis, these tasks are performed by transferring a fluid (dialysate) to and from the abdomen during each session. The fluid goes through the catheter to enter the abdomen at the start of each dialysis session, and it drains out of the body through the catheter at the end of each session. This procedure is done using one of the following techniques: Open technique. This is when the surgery is performed through one large incision. Laparoscopic technique. This is when smaller incisions are made and a tube with a light and camera (laparoscope) is inserted through one of the incisions to help perform the surgery. The camera sends images to a video screen in the operating room. This lets the surgeon see inside the abdomen during the procedure. Tell a health care provider about: Any allergies you have. All medicines you are taking, including vitamins, herbs, eye drops, creams, and over-the-counter medicines. Any problems you or family members have had with anesthetic medicines. Any blood disorders you have. Any surgeries you have had. Any history of smoking. Any  medical conditions you have. Whether you are pregnant or may be pregnant. What are the risks? Generally, this is a safe procedure. However, problems may occur, including: Infection. Too much bleeding. A collection of blood near the incision (hematoma). Damage to blood vessels, tissues, or organs in the abdomen area. Allergic reactions to medicines. Pain or cramping. Slow healing. Catheter problems after the surgery. The catheter may: Become blocked. Move out of place. Poke or wrap around intestines. Allow fluid to leak around it. Scarring. Skin damage. What happens before the procedure? Staying hydrated Follow instructions from your health care provider about hydration, which may include: Up to 2 hours before the procedure - you may continue to drink clear liquids, such as water, clear fruit juice, black coffee, and plain tea.  Eating and drinking restrictions Follow instructions from your health care provider about eating and drinking, which may include: 8 hours before the procedure - stop eating heavy meals or foods, such as meat, fried foods, or fatty foods. 6 hours before the procedure - stop eating light meals or foods, such as toast or cereal. 6 hours before the procedure - stop drinking milk or drinks that contain milk. 2 hours before the procedure - stop drinking clear liquids. Medicines Ask your health care provider about: Changing or stopping your regular medicines. This is especially important if you are taking diabetes medicines or blood thinners. Taking medicines such as aspirin and ibuprofen. These medicines can thin your blood. Do not take these medicines unless your health care provider tells you to take them. Taking over-the-counter medicines, vitamins, herbs, and supplements. Surgery safety Ask your health  care provider: How your surgery site will be marked. What steps will be taken to help prevent infection. These steps may include: Removing hair at the  surgery site. Washing skin with a germ-killing soap. Taking antibiotic medicine. General instructions You may have a CT scan or ultrasound of your abdomen. You may have a blood sample taken. Plan to have a responsible adult take you home from the hospital or clinic. Your health care provider will discuss the best site for the catheter to be placed. The site will be chosen: To help prevent the catheter from being flattened or damaged. To make it as comfortable as possible for you. What happens during the procedure? An IV will be inserted into one of your veins. You will be given one or both of the following: A medicine to help you relax (sedative). A medicine to numb the area (local anesthetic). If you are having an open surgery, one large incision will be made in the abdomen. If you are having laparoscopic surgery, small incisions will be made in the abdomen. A laparoscope and instruments will be put through the incisions. The catheter will be put in place. A short tunnel will be made under the skin to a location where the catheter exits the abdomen. Stitches (sutures) will be placed around the catheter to hold it in place. Your incisions will be closed with sutures or staples. The procedure may vary among health care providers and hospitals. What happens after the procedure? Your blood pressure, heart rate, breathing rate, and blood oxygen level will be monitored until you leave the hospital or clinic. You may have some pain. You will be given pain medicine as needed. You will be given instructions about how to care for your catheter and how it is used for the dialysis process. Summary Peritoneal dialysis catheter placement is a surgery to insert a thin, flexible tube (catheter) in your abdomen. This surgery must be done before you begin peritoneal dialysis. Before the procedure, your health care provider will discuss the best site for the catheter to be placed. The site will be chosen  to help prevent the catheter from being flattened or damaged, and to make it as comfortable as possible for you. After the procedure, you will be given instructions about how to care for your catheter and how it is used for the dialysis process. This information is not intended to replace advice given to you by your health care provider. Make sure you discuss any questions you have with your health care provider. Document Revised: 09/06/2019 Document Reviewed: 09/06/2019 Elsevier Patient Education  2022 [employer].

## 2021-03-02 ENCOUNTER — Telehealth: Payer: Self-pay | Admitting: Surgery

## 2021-03-02 NOTE — Telephone Encounter (Signed)
Patient has been advised of Pre-Admission date/time, COVID Testing date and Surgery date.  Surgery Date: 03/12/21 Preadmission Testing Date: 03/06/21 (phone 7:-30 am to 1:00 pm) Covid Testing Date: not needed.     Patient has been made aware to call [phone], between 1-3:00pm the day before surgery, to find out what time to arrive for surgery.

## 2021-03-04 DIAGNOSIS — I120 Hypertensive chronic kidney disease with stage 5 chronic kidney disease or end stage renal disease: Secondary | ICD-10-CM | POA: Diagnosis not present

## 2021-03-04 DIAGNOSIS — Z6839 Body mass index (BMI) 39.0-39.9, adult: Secondary | ICD-10-CM | POA: Diagnosis not present

## 2021-03-04 DIAGNOSIS — N281 Cyst of kidney, acquired: Secondary | ICD-10-CM | POA: Diagnosis not present

## 2021-03-04 DIAGNOSIS — D631 Anemia in chronic kidney disease: Secondary | ICD-10-CM | POA: Diagnosis not present

## 2021-03-04 DIAGNOSIS — N185 Chronic kidney disease, stage 5: Secondary | ICD-10-CM | POA: Diagnosis not present

## 2021-03-04 DIAGNOSIS — I10 Essential (primary) hypertension: Secondary | ICD-10-CM | POA: Diagnosis not present

## 2021-03-04 DIAGNOSIS — E1122 Type 2 diabetes mellitus with diabetic chronic kidney disease: Secondary | ICD-10-CM | POA: Diagnosis not present

## 2021-03-05 NOTE — Progress Notes (Unsigned)
Medical Clearance received from Dr Ouida Sills. The patient is cleared at Medium risk. The patient is optimized for surgery.

## 2021-03-06 ENCOUNTER — Other Ambulatory Visit
Admission: RE | Admit: 2021-03-06 | Discharge: 2021-03-06 | Disposition: A | Discharge status: Home / Self Care | Payer: Medicare HMO | Source: Ambulatory Visit | Attending: Surgery | Admitting: Surgery

## 2021-03-06 ENCOUNTER — Other Ambulatory Visit: Payer: Self-pay

## 2021-03-06 DIAGNOSIS — N185 Chronic kidney disease, stage 5: Secondary | ICD-10-CM

## 2021-03-06 DIAGNOSIS — Z01812 Encounter for preprocedural laboratory examination: Secondary | ICD-10-CM

## 2021-03-06 HISTORY — DX: Prediabetes: R73.03

## 2021-03-06 HISTORY — DX: Anemia, unspecified: D64.9

## 2021-03-06 NOTE — Patient Instructions (Addendum)
Your procedure is scheduled on: 03/12/21 - Thursday Report to the Registration Desk on the 1st floor of the [facility]. To find out your arrival time, please call [phone] between 1PM - 3PM on: 03/11/21 - Wednesday - Report to the [facility] for labs/EKG on 03/09/21 at 1 pm.  REMEMBER: Instructions that are not followed completely may result in serious medical risk, up to and including death; or upon the discretion of your surgeon and anesthesiologist your surgery may need to be rescheduled.  Do not eat food after midnight the night before surgery.  No gum chewing, lozengers or hard candies.  You may however, drink CLEAR liquids up to 2 hours before you are scheduled to arrive for your surgery. Do not drink anything within 2 hours of your scheduled arrival time.  Clear liquids include: - water  - apple juice without pulp - gatorade (not RED, PURPLE, OR BLUE) - black coffee or tea (Do NOT add milk or creamers to the coffee or tea) Do NOT drink anything that is not on this list.  TAKE THESE MEDICATIONS THE MORNING OF SURGERY WITH A SIP OF WATER:  - allopurinol (ZYLOPRIM) 100 MG tablet - carvedilol (COREG) 12.5 MG tablet - Polyethyl Glycol-Propyl Glycol (SYSTANE) 0.4-0.3 % SOLN  Follow recommendations from Cardiologist, Pulmonologist or PCP regarding stopping Aspirin, Coumadin, Plavix, Eliquis, Pradaxa, or Pletal. Stop taking 5 days prior to your procedure.  One week prior to surgery: Stop Anti-inflammatories (NSAIDS) such as Advil, Aleve, Ibuprofen, Motrin, Naproxen, Naprosyn and Aspirin based products such as Excedrin, Goodys Powder, BC Powder.  Stop ANY OVER THE COUNTER supplements until after surgery.  You may however, continue to take Tylenol if needed for pain up until the day of surgery.  No Alcohol for 24 hours before or after surgery.  No Smoking including e-cigarettes for 24 hours prior to surgery.  No chewable tobacco products for at least 6 hours prior  to surgery.  No nicotine patches on the day of surgery.  Do not use any "recreational" drugs for at least a week prior to your surgery.  Please be advised that the combination of cocaine and anesthesia may have negative outcomes, up to and including death. If you test positive for cocaine, your surgery will be cancelled.  On the morning of surgery brush your teeth with toothpaste and water, you may rinse your mouth with mouthwash if you wish. Do not swallow any toothpaste or mouthwash.  Use CHG Soap or wipes as directed on instruction sheet.  Do not wear jewelry, make-up, hairpins, clips or nail polish.  Do not wear lotions, powders, or perfumes.   Do not shave body from the neck down 48 hours prior to surgery just in case you cut yourself which could leave a site for infection.  Also, freshly shaved skin may become irritated if using the CHG soap.  Contact lenses, hearing aids and dentures may not be worn into surgery.  Do not bring valuables to the hospital. [facility] is not responsible for any missing/lost belongings or valuables.   Notify your doctor if there is any change in your medical condition (cold, fever, infection).  Wear comfortable clothing (specific to your surgery type) to the hospital.  After surgery, you can help prevent lung complications by doing breathing exercises.  Take deep breaths and cough every 1-2 hours. Your doctor may order a device called an Incentive Spirometer to help you take deep breaths. When coughing or sneezing, hold a pillow firmly against your  incision with both hands. This is called splinting. Doing this helps protect your incision. It also decreases belly discomfort.  If you are being admitted to the hospital overnight, leave your suitcase in the car. After surgery it may be brought to your room.  If you are being discharged the day of surgery, you will not be allowed to drive home. You will need a responsible adult (18 years or older)  to drive you home and stay with you that night.   If you are taking public transportation, you will need to have a responsible adult (18 years or older) with you. Please confirm with your physician that it is acceptable to use public transportation.   Please call the [facility] at [phone] if you have any questions about these instructions.  Surgery Visitation Policy:  Patients undergoing a surgery or procedure may have one family member or support person with them as long as that person is not COVID-19 positive or experiencing its symptoms.  That person may remain in the waiting area during the procedure and may rotate out with other people.  Inpatient Visitation:    Visiting hours are 7 a.m. to 8 p.m. Up to two visitors ages 16+ are allowed at one time in a patient room. The visitors may rotate out with other people during the day. Visitors must check out when they leave, or other visitors will not be allowed. One designated support person may remain overnight. The visitor must pass COVID-19 screenings, use hand sanitizer when entering and exiting the patients room and wear a mask at all times, including in the patients room. Patients must also wear a mask when staff or their visitor are in the room. Masking is required regardless of vaccination status.

## 2021-03-09 ENCOUNTER — Other Ambulatory Visit: Payer: Self-pay

## 2021-03-09 ENCOUNTER — Encounter: Payer: Self-pay | Admitting: Urgent Care

## 2021-03-09 ENCOUNTER — Other Ambulatory Visit
Admission: RE | Admit: 2021-03-09 | Discharge: 2021-03-09 | Disposition: A | Discharge status: Home / Self Care | Payer: Medicare HMO | Source: Ambulatory Visit | Attending: Surgery | Admitting: Surgery

## 2021-03-09 DIAGNOSIS — Z01818 Encounter for other preprocedural examination: Secondary | ICD-10-CM | POA: Diagnosis not present

## 2021-03-09 DIAGNOSIS — N185 Chronic kidney disease, stage 5: Secondary | ICD-10-CM | POA: Diagnosis not present

## 2021-03-09 DIAGNOSIS — Z01812 Encounter for preprocedural laboratory examination: Secondary | ICD-10-CM

## 2021-03-09 LAB — CBC
HCT: 28.5 % — ABNORMAL LOW (ref 36.0–46.0)
Hemoglobin: 9.1 g/dL — ABNORMAL LOW (ref 12.0–15.0)
MCH: 31.2 pg (ref 26.0–34.0)
MCHC: 31.9 g/dL (ref 30.0–36.0)
MCV: 97.6 fL (ref 80.0–100.0)
Platelets: 221 K/uL (ref 150–400)
RBC: 2.92 MIL/uL — ABNORMAL LOW (ref 3.87–5.11)
RDW: 14.3 % (ref 11.5–15.5)
WBC: 7.6 K/uL (ref 4.0–10.5)
nRBC: 0 % (ref 0.0–0.2)

## 2021-03-09 LAB — PROTIME-INR
INR: 1.1 (ref 0.8–1.2)
Prothrombin Time: 14.1 s (ref 11.4–15.2)

## 2021-03-09 LAB — APTT: aPTT: 29 s (ref 24–36)

## 2021-03-12 ENCOUNTER — Ambulatory Visit: Payer: Medicare HMO | Admitting: Anesthesiology

## 2021-03-12 ENCOUNTER — Encounter: Payer: Self-pay | Admitting: Surgery

## 2021-03-12 ENCOUNTER — Other Ambulatory Visit: Payer: Self-pay

## 2021-03-12 ENCOUNTER — Encounter
Admission: RE | Disposition: A | Discharge status: Home / Self Care | Payer: Self-pay | Source: Home / Self Care | Attending: Surgery

## 2021-03-12 ENCOUNTER — Ambulatory Visit
Admission: RE | Admit: 2021-03-12 | Discharge: 2021-03-12 | Disposition: A | Discharge status: Home / Self Care | Payer: Medicare HMO | Attending: Surgery | Admitting: Surgery

## 2021-03-12 DIAGNOSIS — D649 Anemia, unspecified: Secondary | ICD-10-CM | POA: Diagnosis not present

## 2021-03-12 DIAGNOSIS — M1990 Unspecified osteoarthritis, unspecified site: Secondary | ICD-10-CM | POA: Insufficient documentation

## 2021-03-12 DIAGNOSIS — K449 Diaphragmatic hernia without obstruction or gangrene: Secondary | ICD-10-CM | POA: Insufficient documentation

## 2021-03-12 DIAGNOSIS — I120 Hypertensive chronic kidney disease with stage 5 chronic kidney disease or end stage renal disease: Secondary | ICD-10-CM | POA: Diagnosis not present

## 2021-03-12 DIAGNOSIS — N281 Cyst of kidney, acquired: Secondary | ICD-10-CM | POA: Insufficient documentation

## 2021-03-12 DIAGNOSIS — N185 Chronic kidney disease, stage 5: Secondary | ICD-10-CM

## 2021-03-12 DIAGNOSIS — D631 Anemia in chronic kidney disease: Secondary | ICD-10-CM | POA: Diagnosis not present

## 2021-03-12 DIAGNOSIS — Z992 Dependence on renal dialysis: Secondary | ICD-10-CM

## 2021-03-12 DIAGNOSIS — K8020 Calculus of gallbladder without cholecystitis without obstruction: Secondary | ICD-10-CM | POA: Diagnosis not present

## 2021-03-12 DIAGNOSIS — N186 End stage renal disease: Secondary | ICD-10-CM | POA: Diagnosis not present

## 2021-03-12 DIAGNOSIS — D759 Disease of blood and blood-forming organs, unspecified: Secondary | ICD-10-CM | POA: Insufficient documentation

## 2021-03-12 DIAGNOSIS — Z4902 Encounter for fitting and adjustment of peritoneal dialysis catheter: Secondary | ICD-10-CM | POA: Insufficient documentation

## 2021-03-12 DIAGNOSIS — Z6838 Body mass index (BMI) 38.0-38.9, adult: Secondary | ICD-10-CM | POA: Diagnosis not present

## 2021-03-12 HISTORY — PX: CAPD INSERTION: SHX5233

## 2021-03-12 LAB — POCT I-STAT, CHEM 8
BUN: 125 mg/dL — ABNORMAL HIGH (ref 8–23)
Calcium, Ion: 1.23 mmol/L (ref 1.15–1.40)
Chloride: 108 mmol/L (ref 98–111)
Creatinine, Ser: 4.2 mg/dL — ABNORMAL HIGH (ref 0.44–1.00)
Glucose, Bld: 105 mg/dL — ABNORMAL HIGH (ref 70–99)
HCT: 27 % — ABNORMAL LOW (ref 36.0–46.0)
Hemoglobin: 9.2 g/dL — ABNORMAL LOW (ref 12.0–15.0)
Potassium: 4.6 mmol/L (ref 3.5–5.1)
Sodium: 137 mmol/L (ref 135–145)
TCO2: 20 mmol/L — ABNORMAL LOW (ref 22–32)

## 2021-03-12 SURGERY — LAPAROSCOPIC INSERTION CONTINUOUS AMBULATORY PERITONEAL DIALYSIS  (CAPD) CATHETER
Anesthesia: General

## 2021-03-12 MED ORDER — CHLORHEXIDINE GLUCONATE 0.12 % MT SOLN: 15.0000 mL | Freq: Once | OROMUCOSAL | Status: AC

## 2021-03-12 MED ORDER — ACETAMINOPHEN 10 MG/ML IV SOLN: 1000.0000 mg | Freq: Once | INTRAVENOUS | Status: DC | PRN

## 2021-03-12 MED ORDER — SODIUM CHLORIDE 0.9 % IV SOLN: INTRAVENOUS | Status: DC | PRN

## 2021-03-12 MED ORDER — DEXAMETHASONE SODIUM PHOSPHATE 10 MG/ML IJ SOLN
INTRAMUSCULAR | Status: DC | PRN
  Administered 2021-03-12: 5 mg via INTRAVENOUS

## 2021-03-12 MED ORDER — ACETAMINOPHEN 500 MG PO TABS: 1000.0000 mg | ORAL_TABLET | ORAL | Status: AC

## 2021-03-12 MED ORDER — CEFAZOLIN SODIUM-DEXTROSE 2-4 GM/100ML-% IV SOLN
2.0000 g | INTRAVENOUS | Status: AC
  Administered 2021-03-12: 2 g via INTRAVENOUS

## 2021-03-12 MED ORDER — ONDANSETRON HCL 4 MG/2ML IJ SOLN
INTRAMUSCULAR | Status: DC | PRN
Start: 2021-03-12 — End: 2021-03-12
  Administered 2021-03-12: 4 mg via INTRAVENOUS

## 2021-03-12 MED ORDER — LIDOCAINE HCL (CARDIAC) PF 100 MG/5ML IV SOSY
PREFILLED_SYRINGE | INTRAVENOUS | Status: DC | PRN
  Administered 2021-03-12: 50 mg via INTRAVENOUS

## 2021-03-12 MED ORDER — ACETAMINOPHEN 500 MG PO TABS
ORAL_TABLET | ORAL | Status: AC
  Administered 2021-03-12: 1000 mg via ORAL
  Filled 2021-03-12: qty 2

## 2021-03-12 MED ORDER — CEFAZOLIN SODIUM-DEXTROSE 2-4 GM/100ML-% IV SOLN
INTRAVENOUS | Status: AC
  Filled 2021-03-12: qty 100

## 2021-03-12 MED ORDER — OXYCODONE HCL 5 MG PO TABS: 5.0000 mg | ORAL_TABLET | Freq: Once | ORAL | Status: AC | PRN

## 2021-03-12 MED ORDER — PROPOFOL 10 MG/ML IV BOLUS
INTRAVENOUS | Status: DC | PRN
  Administered 2021-03-12: 100 mg via INTRAVENOUS

## 2021-03-12 MED ORDER — PHENYLEPHRINE HCL-NACL 20-0.9 MG/250ML-% IV SOLN
INTRAVENOUS | Status: DC | PRN
  Administered 2021-03-12: 50 ug/min via INTRAVENOUS

## 2021-03-12 MED ORDER — OXYCODONE HCL 5 MG PO TABS
ORAL_TABLET | ORAL | Status: AC
  Administered 2021-03-12: 5 mg via ORAL
  Filled 2021-03-12: qty 1

## 2021-03-12 MED ORDER — GABAPENTIN 300 MG PO CAPS
ORAL_CAPSULE | ORAL | Status: AC
  Administered 2021-03-12: 300 mg via ORAL
  Filled 2021-03-12: qty 1

## 2021-03-12 MED ORDER — SUGAMMADEX SODIUM 200 MG/2ML IV SOLN
INTRAVENOUS | Status: DC | PRN
  Administered 2021-03-12: 200 mg via INTRAVENOUS

## 2021-03-12 MED ORDER — GABAPENTIN 300 MG PO CAPS: 300.0000 mg | ORAL_CAPSULE | ORAL | Status: AC

## 2021-03-12 MED ORDER — FENTANYL CITRATE (PF) 100 MCG/2ML IJ SOLN: 25.0000 ug | INTRAMUSCULAR | Status: DC | PRN

## 2021-03-12 MED ORDER — PHENYLEPHRINE 40 MCG/ML (10ML) SYRINGE FOR IV PUSH (FOR BLOOD PRESSURE SUPPORT)
PREFILLED_SYRINGE | INTRAVENOUS | Status: DC | PRN
  Administered 2021-03-12 (×2): 200 ug via INTRAVENOUS

## 2021-03-12 MED ORDER — BUPIVACAINE-EPINEPHRINE (PF) 0.5% -1:200000 IJ SOLN
INTRAMUSCULAR | Status: AC
  Filled 2021-03-12: qty 30

## 2021-03-12 MED ORDER — FAMOTIDINE 20 MG PO TABS: 20.0000 mg | ORAL_TABLET | Freq: Once | ORAL | Status: AC

## 2021-03-12 MED ORDER — CHLORHEXIDINE GLUCONATE CLOTH 2 % EX PADS
6.0000 | MEDICATED_PAD | Freq: Once | CUTANEOUS | Status: AC
  Administered 2021-03-12: 6 via TOPICAL

## 2021-03-12 MED ORDER — 0.9 % SODIUM CHLORIDE (POUR BTL) OPTIME
TOPICAL | Status: DC | PRN
  Administered 2021-03-12: 1000 mL

## 2021-03-12 MED ORDER — ROCURONIUM BROMIDE 100 MG/10ML IV SOLN
INTRAVENOUS | Status: DC | PRN
  Administered 2021-03-12 (×2): 10 mg via INTRAVENOUS
  Administered 2021-03-12: 30 mg via INTRAVENOUS

## 2021-03-12 MED ORDER — OXYCODONE HCL 5 MG PO TABS: 5.0000 mg | ORAL_TABLET | ORAL | 0 refills | Status: DC | PRN

## 2021-03-12 MED ORDER — SODIUM CHLORIDE 0.9 % IV SOLN: INTRAVENOUS | Status: DC

## 2021-03-12 MED ORDER — PROMETHAZINE HCL 25 MG/ML IJ SOLN: 6.2500 mg | INTRAMUSCULAR | Status: DC | PRN

## 2021-03-12 MED ORDER — HEPARIN SODIUM (PORCINE) 5000 UNIT/ML IJ SOLN
INTRAMUSCULAR | Status: AC
  Filled 2021-03-12: qty 1

## 2021-03-12 MED ORDER — ORAL CARE MOUTH RINSE: 15.0000 mL | Freq: Once | OROMUCOSAL | Status: AC

## 2021-03-12 MED ORDER — FAMOTIDINE 20 MG PO TABS
ORAL_TABLET | ORAL | Status: AC
  Administered 2021-03-12: 20 mg via ORAL
  Filled 2021-03-12: qty 1

## 2021-03-12 MED ORDER — BUPIVACAINE-EPINEPHRINE 0.5% -1:200000 IJ SOLN
INTRAMUSCULAR | Status: DC | PRN
  Administered 2021-03-12: 30 mL

## 2021-03-12 MED ORDER — OXYCODONE HCL 5 MG/5ML PO SOLN: 5.0000 mg | Freq: Once | ORAL | Status: AC | PRN

## 2021-03-12 MED ORDER — CHLORHEXIDINE GLUCONATE 0.12 % MT SOLN
OROMUCOSAL | Status: AC
  Administered 2021-03-12: 15 mL via OROMUCOSAL
  Filled 2021-03-12: qty 15

## 2021-03-12 SURGICAL SUPPLY — 50 items
ADAPTER CATH DIALYSIS 4X8 IT L (MISCELLANEOUS) ×2 IMPLANT
ADH SKN CLS APL DERMABOND .7 (GAUZE/BANDAGES/DRESSINGS) ×1
BIOPATCH WHT 1IN DISK W/4.0 H (GAUZE/BANDAGES/DRESSINGS) ×1 IMPLANT
BLADE SURG 11 STRL SS SAFETY (MISCELLANEOUS) ×2 IMPLANT
CATH EXTENDED DIALYSIS (CATHETERS) ×2 IMPLANT
DERMABOND ADVANCED (GAUZE/BANDAGES/DRESSINGS) ×1
DERMABOND ADVANCED .7 DNX12 (GAUZE/BANDAGES/DRESSINGS) ×1 IMPLANT
ELECT CAUTERY BLADE 6.4 (BLADE) ×2 IMPLANT
ELECT REM PT RETURN 9FT ADLT (ELECTROSURGICAL) ×2
ELECTRODE REM PT RTRN 9FT ADLT (ELECTROSURGICAL) ×1 IMPLANT
GLOVE SURG SYN 7.0 (GLOVE) ×4 IMPLANT
GLOVE SURG SYN 7.0 PF PI (GLOVE) ×1 IMPLANT
GLOVE SURG SYN 7.5  E (GLOVE) ×2
GLOVE SURG SYN 7.5 E (GLOVE) ×2 IMPLANT
GLOVE SURG SYN 7.5 PF PI (GLOVE) ×1 IMPLANT
GOWN STRL REUS W/ TWL LRG LVL3 (GOWN DISPOSABLE) ×2 IMPLANT
GOWN STRL REUS W/TWL LRG LVL3 (GOWN DISPOSABLE) ×4
GRASPER SUT TROCAR 14GX15 (MISCELLANEOUS) ×1 IMPLANT
IV NS 1000ML (IV SOLUTION) ×2
IV NS 1000ML BAXH (IV SOLUTION) ×1 IMPLANT
KIT TURNOVER KIT A (KITS) ×2 IMPLANT
LABEL OR SOLS (LABEL) ×2 IMPLANT
MANIFOLD NEPTUNE II (INSTRUMENTS) ×2 IMPLANT
MINICAP W/POVIDONE IODINE SOL (MISCELLANEOUS) ×2 IMPLANT
NDL INSUFFLATION 14GA 120MM (NEEDLE) ×1 IMPLANT
NEEDLE HYPO 22GX1.5 SAFETY (NEEDLE) ×2 IMPLANT
NEEDLE INSUFFLATION 14GA 120MM (NEEDLE) ×2 IMPLANT
NS IRRIG 500ML POUR BTL (IV SOLUTION) ×2 IMPLANT
PACK LAP CHOLECYSTECTOMY (MISCELLANEOUS) ×2 IMPLANT
PENCIL ELECTRO HAND CTR (MISCELLANEOUS) ×2 IMPLANT
SET CYSTO W/LG BORE CLAMP LF (SET/KITS/TRAYS/PACK) ×2 IMPLANT
SET TRANSFER 6 W/TWIST CLAMP 5 (SET/KITS/TRAYS/PACK) ×2 IMPLANT
SET TUBE SMOKE EVAC HIGH FLOW (TUBING) ×2 IMPLANT
SLEEVE ADV FIXATION 5X100MM (TROCAR) ×4 IMPLANT
SPONGE DRAIN TRACH 4X4 STRL 2S (GAUZE/BANDAGES/DRESSINGS) ×2 IMPLANT
STYLET FALLER (MISCELLANEOUS) IMPLANT
STYLET FALLER MEDIONICS (MISCELLANEOUS) ×1 IMPLANT
SUT ETHILON 2 0 FS 18 (SUTURE) ×2 IMPLANT
SUT MNCRL 4-0 (SUTURE) ×2
SUT MNCRL 4-0 27XMFL (SUTURE) ×1
SUT VIC AB 2-0 UR6 27 (SUTURE) ×1 IMPLANT
SUT VIC AB 3-0 SH 27 (SUTURE) ×2
SUT VIC AB 3-0 SH 27X BRD (SUTURE) ×1 IMPLANT
SUT VICRYL 0 AB UR-6 (SUTURE) ×2 IMPLANT
SUTURE MNCRL 4-0 27XMF (SUTURE) ×1 IMPLANT
SYS KII FIOS ACCESS ABD 5X100 (TROCAR) ×2
SYSTEM KII FIOS ACES ABD 5X100 (TROCAR) ×1 IMPLANT
TROCAR SL VERSASTEP 5M LG  B (MISCELLANEOUS) ×1
TROCAR SL VERSASTEP 5M LG B (MISCELLANEOUS) IMPLANT
WATER STERILE IRR 500ML POUR (IV SOLUTION) ×1 IMPLANT

## 2021-03-12 NOTE — Interval H&P Note (Signed)
History and Physical Interval Note:  03/12/2021 1:37 PM  Laura Meyer  has presented today for surgery, with the diagnosis of Stage V CKD.  The various methods of treatment have been discussed with the patient and family. After consideration of risks, benefits and other options for treatment, the patient has consented to  Procedure(s) with comments: Meyer  (CAPD) CATHETER (N/A) - Provider requesting 2 hours / 120 minutes for procedure. as a surgical intervention.  The patient's history has been reviewed, patient examined, no change in status, stable for surgery.  I have reviewed the patient's chart and labs.  Questions were answered to the patient's satisfaction.     Otha Monical

## 2021-03-12 NOTE — Discharge Instructions (Signed)

## 2021-03-12 NOTE — Anesthesia Preprocedure Evaluation (Addendum)
Anesthesia Evaluation  Patient identified by MRN, date of birth, ID band Patient awake    Reviewed: Allergy & Precautions, NPO status , Patient's Chart, lab work & pertinent test results, reviewed documented beta blocker date and time   Airway Mallampati: III  TM Distance: >3 FB Neck ROM: full    Dental no notable dental hx.    Pulmonary neg pulmonary ROS,    Pulmonary exam normal        Cardiovascular hypertension, Pt. on home beta blockers and Pt. on medications Normal cardiovascular exam  XNA:TFTDDU sinus rhythm Normal ECG When compared with ECG of 17-Jun-2003 08:03, No significant change was found Confirmed by UNCONFIRMED, DOCTOR ([id number]), editor Mel Almond, Tammy [phone]) on 03/10/2021 1:18:26 PM   Neuro/Psych negative neurological ROS  negative psych ROS   GI/Hepatic negative GI ROS, Neg liver ROS,   Endo/Other    Renal/GU Renal disease (Stage V CKD)     Musculoskeletal  (+) Arthritis , Osteoarthritis,    Abdominal (+) + obese,   Peds  Hematology  (+) Blood dyscrasia, anemia ,   Anesthesia Other Findings Past Medical History: No date: Anemia No date: Arthritis     Comment:  hands No date: CKD (chronic kidney disease), stage IV (HCC) No date: Hypertension No date: Pre-diabetes 2019: Vertigo     Comment:  2 episodes.  none in over 1 yr.  Past Surgical History: 06/28/2018: CATARACT EXTRACTION W/PHACO; Left     Comment:  Procedure: CATARACT EXTRACTION PHACO AND INTRAOCULAR               LENS PLACEMENT ([place])  LEFT;  Surgeon: Leandrew Koyanagi, MD;  Location: [facility];  Service:               Ophthalmology;  Laterality: Left; 07/19/2018: CATARACT EXTRACTION W/PHACO; Right     Comment:  Procedure: CATARACT EXTRACTION PHACO AND INTRAOCULAR               LENS PLACEMENT ([place]) RIGHT;  Surgeon: Leandrew Koyanagi, MD;  Location: [facility];  Service:                Ophthalmology;  Laterality: Right; No date: DILATION AND CURETTAGE OF UTERUS No date: PARATHYROIDECTOMY 07/03/2003: REPLACEMENT TOTAL KNEE; Bilateral     Comment:  left - 07/03/03, right - 07/26/12     Reproductive/Obstetrics negative OB ROS                            Anesthesia Physical Anesthesia Plan  ASA: 3  Anesthesia Plan: General ETT   Post-op Pain Management: Gabapentin PO (pre-op) and Tylenol PO (pre-op)   Induction: Intravenous  PONV Risk Score and Plan: Ondansetron, Dexamethasone, Midazolam and Treatment may vary due to age or medical condition  Airway Management Planned: Oral ETT  Additional Equipment:   Intra-op Plan:   Post-operative Plan: Extubation in OR  Informed Consent: I have reviewed the patients History and Physical, chart, labs and discussed the procedure including the risks, benefits and alternatives for the proposed anesthesia with the patient or authorized representative who has indicated his/her understanding and acceptance.     Dental advisory given  Plan Discussed with: Anesthesiologist, CRNA and Surgeon  Anesthesia Plan Comments:        Anesthesia Quick Evaluation

## 2021-03-12 NOTE — Anesthesia Procedure Notes (Signed)
Procedure Name: Intubation Date/Time: 03/12/2021 2:23 PM Performed by: Beverely Low, CRNA Pre-anesthesia Checklist: Patient identified, Patient being monitored, Timeout performed, Emergency Drugs available and Suction available Patient Re-evaluated:Patient Re-evaluated prior to induction Oxygen Delivery Method: Circle system utilized Preoxygenation: Pre-oxygenation with 100% oxygen Induction Type: IV induction Ventilation: Mask ventilation without difficulty Laryngoscope Size: 3 and Glidescope Grade View: Grade I Tube type: Oral Tube size: 7.0 mm Number of attempts: 1 Placement Confirmation: ETT inserted through vocal cords under direct vision, positive ETCO2 and breath sounds checked- equal and bilateral Secured at: 21 cm Tube secured with: Tape Dental Injury: Teeth and Oropharynx as per pre-operative assessment

## 2021-03-12 NOTE — Op Note (Signed)
°  Procedure Date:  03/12/2021  Pre-operative Diagnosis:  Chronic kidney disease stage V, approaching need for dialysis  Post-operative Diagnosis: Chronic kidney disease stage V, approaching need for dialysis  Procedure:  Laparoscopic peritoneal dialysis catheter placement;  laparoscopic omentopexy.   Surgeon:  Melvyn Neth, MD  Anesthesia:  General endotracheal  Estimated Blood Loss:  5 ml  Specimens:  None  Complications:  None  Indications for Procedure:  This is a 81 y.o. female who presents with chronic kidney disease stage V, approaching need for dialysis.  She's discussed with the nephrology team about options for dialysis and she has decided on peritoneal dialysis. The risks of bleeding, abscess or infection, injury to surrounding structures were all discussed with the patient and was willing to proceed.  Description of Procedure: The patient was correctly identified in the preoperative area and brought into the operating room.  The patient was placed supine with VTE prophylaxis in place.  Appropriate time-outs were performed.  Anesthesia was induced and the patient was intubated.  Appropriate antibiotics were infused.  The abdomen was prepped and draped in a sterile fashion.  The peritoneal dialysis catheter was placed over the patient's abdomen and appropriate markings on the abdominal wall were made for catheter insertion, cuff location, tunneling, and exit site.  A Veress needle was introduced in the right upper quadrant and pneumoperitoneum was obtained with appropriate pressures.  Using Optivue technique, a 5 mm port was placed in the RUQ and then under direct visualization, an additional 5 mm port in the RLQ.  The abdomen was explored and there were no masses or adhesions.  The patient's omentum reached the pelvis.  There were no hernias.  Then, a 4 cm incision was made over the left rectus muscle, superior to the umbilicus.  Cautery was used to dissect through the  subcutaneous tissue and then into the anterior rectus sheath.  A 5 mm port was then used to tunnel posterior to the rectus inferiorly for a distance of 6 cm, and then passed through the peritoneum.  The PD catheter was passed through this track and placed in the pelvis without complication.  The cuff of the catheter was placed within the rectus muscle.  Then, using a PMI and 0 Vicryl suture, an omentopexy was done in the LUQ to prevent any complication of the catheter with the omentum.  The catheter was then trimmed appropriately and connected to the extension catheter and tunneled in the LUQ to an exit site that was 3+ cm away from the last cuff.  The rectus sheath was then closed using purse string 0 Vicryl.  The catheter was tested with heparinized saline and had good flow in and out.  The 5 mm ports were removed.  Local anesthetic was infused onto the rectus sheath, subcutaneous tissues, and incisions.  The periumbilical incision was closed with 3-0 Vicryl and 4-0 Monocryl, and the other incisions with 4-0 Monocryl.  The catheter was then connected to the adapter cap and locked.  BioPatch was placed at the exit site and the catheter was dressed with 4x4 gauze and tape.  The incisions were cleaned and sealed with DermaBond.  The patient was emerged from anesthesia and extubated and brought to the recovery room for further management.  The patient tolerated the procedure well and all counts were correct at the end of the case.   Melvyn Neth, MD

## 2021-03-12 NOTE — Transfer of Care (Signed)
Immediate Anesthesia Transfer of Care Note  Patient: Laura Meyer  Procedure(s) Performed: LAPAROSCOPIC INSERTION CONTINUOUS AMBULATORY PERITONEAL DIALYSIS  (CAPD) CATHETER  Patient Location: PACU  Anesthesia Type:General  Level of Consciousness: drowsy and patient cooperative  Airway & Oxygen Therapy: Patient Spontanous Breathing and Patient connected to face mask oxygen  Post-op Assessment: Report given to RN and Post -op Vital signs reviewed and stable  Post vital signs: Reviewed and stable  Last Vitals:  Vitals Value Taken Time  BP 141/66 03/12/21 1618  Temp    Pulse 68 03/12/21 1628  Resp 16 03/12/21 1628  SpO2 96 % 03/12/21 1628  Vitals shown include unvalidated device data.  Last Pain:  Vitals:   03/12/21 1216  TempSrc: Temporal  PainSc: 0-No pain      Patients Stated Pain Goal: 0 (70/01/74 9449)  Complications: No notable events documented.

## 2021-03-13 ENCOUNTER — Encounter: Payer: Self-pay | Admitting: Surgery

## 2021-03-13 NOTE — Anesthesia Postprocedure Evaluation (Signed)
Anesthesia Post Note  Patient: Laura Meyer  Procedure(s) Performed: LAPAROSCOPIC INSERTION CONTINUOUS AMBULATORY PERITONEAL DIALYSIS  (CAPD) CATHETER  Patient location during evaluation: PACU Anesthesia Type: General Level of consciousness: awake and alert Pain management: pain level controlled Vital Signs Assessment: post-procedure vital signs reviewed and stable Respiratory status: spontaneous breathing, nonlabored ventilation, respiratory function stable and patient connected to nasal cannula oxygen Cardiovascular status: blood pressure returned to baseline and stable Postop Assessment: no apparent nausea or vomiting Anesthetic complications: no   No notable events documented.   Last Vitals:  Vitals:   03/12/21 1645 03/12/21 1704  BP: 140/76 140/70  Pulse: 68 71  Resp: 16 16  Temp: (!) 36.4 C (!) 36.1 C  SpO2: 97% 100%    Last Pain:  Vitals:   03/12/21 1704  TempSrc:   PainSc: 3                  Martha Clan

## 2021-03-26 ENCOUNTER — Other Ambulatory Visit: Payer: Self-pay

## 2021-03-26 ENCOUNTER — Encounter: Payer: Self-pay | Admitting: Urology

## 2021-03-26 ENCOUNTER — Ambulatory Visit: Payer: Medicare HMO | Admitting: Urology

## 2021-03-26 VITALS — BP 128/75 | HR 83 | Ht 59.0 in | Wt 192.0 lb

## 2021-03-26 DIAGNOSIS — N2889 Other specified disorders of kidney and ureter: Secondary | ICD-10-CM

## 2021-03-26 DIAGNOSIS — Z992 Dependence on renal dialysis: Secondary | ICD-10-CM

## 2021-03-26 DIAGNOSIS — N186 End stage renal disease: Secondary | ICD-10-CM

## 2021-03-26 NOTE — Progress Notes (Signed)
03/26/21 12:59 PM   Laura Meyer September 22, 1940 [id number]  CC: Right complex cyst/renal mass, ESRD on dialysis  HPI: 81 year old female referred by nephrology for the above issues.  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  A renal ultrasound ordered by nephrology for ESRD showed a 3.5 cm complex cyst versus solid lesion in the lower pole of the right kidney, an MRI was recommended.  She underwent MRI without contrast x2 in November 2022 and January 2023 that showed possible slight interval growth in the complex cystic lesion.  She denies any flank pain or gross hematuria.   PMH: Past Medical History:  Diagnosis Date   Anemia    Arthritis    hands   CKD (chronic kidney disease), stage IV (HCC)    Hypertension    Pre-diabetes    Vertigo 2019   2 episodes.  none in over 1 yr.    Surgical History: Past Surgical History:  Procedure Laterality Date   CAPD INSERTION N/A 03/12/2021   Procedure: LAPAROSCOPIC INSERTION CONTINUOUS AMBULATORY PERITONEAL DIALYSIS  (CAPD) CATHETER;  Surgeon: Olean Ree, MD;  Location: [facility];  Service: General;  Laterality: N/A;  Provider requesting 2 hours / 120 minutes for procedure.   CATARACT EXTRACTION W/PHACO Left 06/28/2018   Procedure: CATARACT EXTRACTION PHACO AND INTRAOCULAR LENS PLACEMENT ([place])  LEFT;  Surgeon: Leandrew Koyanagi, MD;  Location: [facility];  Service: Ophthalmology;  Laterality: Left;   CATARACT EXTRACTION W/PHACO Right 07/19/2018   Procedure: CATARACT EXTRACTION PHACO AND INTRAOCULAR LENS PLACEMENT ([place]) RIGHT;  Surgeon: Leandrew Koyanagi, MD;  Location: [facility];  Service: Ophthalmology;  Laterality: Right;   DILATION AND CURETTAGE OF UTERUS     PARATHYROIDECTOMY     REPLACEMENT TOTAL KNEE Bilateral 07/03/2003   left - 07/03/03, right - 07/26/12    Family History: No family history on file.  Social History:  reports  that she has never smoked. She has never used smokeless tobacco. She reports that she does not currently use alcohol. She reports that she does not use drugs.  Physical Exam: BP 128/75    Pulse 83    Ht 4\' 11"  (1.499 m)    Wt 192 lb (87.1 kg)    BMI 38.78 kg/m    Constitutional:  Alert and oriented, No acute distress. Cardiovascular: No clubbing, cyanosis, or edema. Respiratory: Normal respiratory effort, no increased work of breathing. GI: Abdomen is soft, nontender, nondistended, no abdominal masses   Pertinent Imaging: I have personally viewed and interpreted the renal ultrasound, and MRI from November 2022 as well as January 2023 showing a 3.5 cm complex cystic lesion in the right lower pole of the kidney.  Assessment & Plan:   81 year old female with ESRD and recent peritoneal dialysis catheter placement in anticipation of starting peritoneal HD next week found to have a 3.5 cm complex renal cyst on ultrasound and MRI without contrast.  We discussed possible etiologies including complex cyst versus renal mass, and that further imaging with contrast is needed to determine enhancement and neck steps.  Even if this is found to be a solid mass with enhancement, she would be a good candidate for active surveillance with her age and comorbidities.  We discussed the less than 4% risk of metastatic disease over the next 15 years even if this represents a kidney tumor.  Patient and her daughter are in agreement with repeat imaging in 6 months with  contrast.  We also discussed other options like percutaneous ablation or even nephrectomy, but she is very averse to nephrectomy with her age and comorbidities which is very reasonable.  MRI abdomen with and without contrast in 6 months   Nickolas Madrid, MD 03/26/2021  [facility] [address] [place], [place] [address] [phone]

## 2021-03-26 NOTE — Patient Instructions (Signed)
You have a small complex cyst on your right kidney.  This is not able to be completely evaluated without contrast on the MRI.  These can almost always be safely watched, and have very low risk of causing symptoms or spreading outside the kidney over the next 10 to 15 years.  Plan to repeat imaging with contrast in 6 months to evaluate for any change  Renal Mass A renal mass is an abnormal growth in the kidney. It may be found while performing an MRI, CT scan, or ultrasound to evaluate other problems of the abdomen. A renal mass that is cancerous (malignant) may grow or spread quickly. Others are not cancerous (benign). Renal masses include: Tumors. These may be malignant or benign. The most common type of kidney cancer in adults is renal cell carcinoma. In children, the most common type of kidney cancer is Wilms tumor. The most common benign tumors of the kidney include renal adenomas, oncocytomas, and angiomyolipoma (AML). Cysts. These are fluid-filled sacs that form on or in the kidney. What are the causes? Certain types of cancers, infections, or injuries can cause a renal mass. It is not always known what causes a cyst to develop in or on the kidney. What are the signs or symptoms? Often, a renal mass does not cause any signs or symptoms; most kidney cysts do not cause symptoms. How is this diagnosed? Your health care provider may recommend tests to diagnose the cause of your renal mass. These tests may be done if a renal mass is found: Physical exam. Blood tests. Urine tests. Imaging tests, such as ultrasound, CT scan, or MRI. Biopsy. This is a small sample that is removed from the renal mass and tested in a lab. The exact tests and how often they are done will depend on: The size and appearance of the renal mass. Risk factors or medical conditions that increase your risk for problems. Any symptoms associated with the renal mass, or concerns that you have about it. Tests and physical  exams may be done once, or they may be done regularly for a period of time. Tests and exams that are done regularly will help monitor whether the mass is growing and beginning to cause problems. How is this treated? Treatment is not always needed for this condition. Your health care provider may recommend careful monitoring and regular tests and exams. Treatment will depend on the cause of the mass. Treatment for a cancerous renal mass may include surgical removal, chemotherapy, radiation, or immunotherapy. Most kidney cysts do not need to be treated. Follow these instructions at home: What you need to do at home will depend on the cause of the mass. Follow the instructions that your health care provider gives to you. In general: Take over-the-counter and prescription medicines only as told by your health care provider. If you were prescribed an antibiotic medicine, take it as told by your health care provider. Do not stop taking the antibiotic even if you start to feel better. Follow any restrictions that are given to you by your health care provider. Keep all follow-up visits. This is important. You may need to see your health care provider once or twice a year to have CT scans and ultrasounds. These tests will show if your renal mass has changed or grown. Contact a health care provider if you: Have pain in your side or back (flank pain). Have a fever. Feel full soon after eating. Have pain or swelling in the abdomen. Lose weight. Get  help right away if: Your pain gets worse. There is blood in your urine. You cannot urinate. You have chest pain. You have trouble breathing. These symptoms may represent a serious problem that is an emergency. Do not wait to see if the symptoms will go away. Get medical help right away. Call your local emergency services (911 in the [place] Summary A renal mass is an abnormal growth in the kidney. It may be cancerous (malignant) and grow or spread quickly, or  it may not be cancerous (benign). Renal masses often do not have any signs or symptoms. Renal masses may be found while performing an MRI, CT scan, or ultrasound for other problems of the abdomen. Your health care provider may recommend that you have tests to diagnose the cause of your renal mass. These may include a physical exam, blood tests, urine tests, imaging, or a biopsy. Treatment is not always needed for this condition. Careful monitoring may be recommended. This information is not intended to replace advice given to you by your health care provider. Make sure you discuss any questions you have with your health care provider. Document Revised: 07/16/2019 Document Reviewed: 07/16/2019 Elsevier Patient Education  [place].

## 2021-03-27 ENCOUNTER — Encounter: Payer: Self-pay | Admitting: Surgery

## 2021-03-27 ENCOUNTER — Ambulatory Visit (INDEPENDENT_AMBULATORY_CARE_PROVIDER_SITE_OTHER): Payer: Medicare HMO | Admitting: Surgery

## 2021-03-27 ENCOUNTER — Other Ambulatory Visit: Payer: Self-pay

## 2021-03-27 VITALS — BP 137/85 | HR 76 | Temp 97.7°F | Ht 59.0 in | Wt 193.8 lb

## 2021-03-27 DIAGNOSIS — N185 Chronic kidney disease, stage 5: Secondary | ICD-10-CM

## 2021-03-27 DIAGNOSIS — Z992 Dependence on renal dialysis: Secondary | ICD-10-CM

## 2021-03-27 DIAGNOSIS — N186 End stage renal disease: Secondary | ICD-10-CM

## 2021-03-27 DIAGNOSIS — Z09 Encounter for follow-up examination after completed treatment for conditions other than malignant neoplasm: Secondary | ICD-10-CM

## 2021-03-27 NOTE — Progress Notes (Signed)
03/27/2021  HPI: Kirstina Leinweber is a 81 y.o. female s/p laparoscopic assisted PD catheter placement with omentopexy on 03/12/21.  Patient presents today for follow up.  Has not started dialysis yet and she has an instruction course on 04/06/21.  She reports that initially she had significant discomfort, but now is doing better.  Denies any issues with the incisions or with the catheter itself.  Vital signs: BP 137/85    Pulse 76    Temp 97.7 F (36.5 C) (Oral)    Ht 4\' 11"  (1.499 m)    Wt 193 lb 12.8 oz (87.9 kg)    SpO2 97%    BMI 39.14 kg/m    Physical Exam: Constitutional:  No acute distress Abdomen:  Soft, non-distended, non-tender to palpation.  Patient had ecchymosis around the umbilicus and a hematoma at the periumbilical incision, with only some soreness now to palpation.  Incisions are healing well otherwise, clean, dry, intact.  Catheter without any evidence of exit site infection.  New clean dressing applied.  Assessment/Plan: This is a 81 y.o. female s/p lap PD catheter placement.  --Discussed with the patient that the main reason for the increased discomfort initially was likely due to the hematoma that's formed.  Potentially there was some bleeding from the rectus muscle.  This is improving and is no longer as tender as before.  Reassured her that this will continue to improve but does take time for the hematoma to fully reabsorb.  No other complications.  No infection. --Instructed the patient to follow the dressing instructions and shower instructions given by the [facility] dialysis team.  Reminded her of activity restrictions for 4 weeks total. --Follow up as needed.   Melvyn Neth, [facility]

## 2021-03-27 NOTE — Patient Instructions (Signed)
Please call with any questions or concerns.You may apply ice pack to the bruising. Next week you may apply heat. You may resume driving an automobile.

## 2021-04-06 DIAGNOSIS — Z992 Dependence on renal dialysis: Secondary | ICD-10-CM | POA: Diagnosis not present

## 2021-04-06 DIAGNOSIS — N186 End stage renal disease: Secondary | ICD-10-CM | POA: Diagnosis not present

## 2021-04-07 DIAGNOSIS — Z992 Dependence on renal dialysis: Secondary | ICD-10-CM | POA: Diagnosis not present

## 2021-04-07 DIAGNOSIS — E785 Hyperlipidemia, unspecified: Secondary | ICD-10-CM | POA: Diagnosis not present

## 2021-04-07 DIAGNOSIS — N186 End stage renal disease: Secondary | ICD-10-CM | POA: Diagnosis not present

## 2021-04-07 DIAGNOSIS — Z79899 Other long term (current) drug therapy: Secondary | ICD-10-CM | POA: Diagnosis not present

## 2021-04-07 DIAGNOSIS — Z5181 Encounter for therapeutic drug level monitoring: Secondary | ICD-10-CM | POA: Diagnosis not present

## 2021-04-09 DIAGNOSIS — Z992 Dependence on renal dialysis: Secondary | ICD-10-CM | POA: Diagnosis not present

## 2021-04-09 DIAGNOSIS — N186 End stage renal disease: Secondary | ICD-10-CM | POA: Diagnosis not present

## 2021-04-10 DIAGNOSIS — N186 End stage renal disease: Secondary | ICD-10-CM | POA: Diagnosis not present

## 2021-04-10 DIAGNOSIS — Z992 Dependence on renal dialysis: Secondary | ICD-10-CM | POA: Diagnosis not present

## 2021-04-12 DIAGNOSIS — N186 End stage renal disease: Secondary | ICD-10-CM | POA: Diagnosis not present

## 2021-04-12 DIAGNOSIS — Z992 Dependence on renal dialysis: Secondary | ICD-10-CM | POA: Diagnosis not present

## 2021-04-14 DIAGNOSIS — Z992 Dependence on renal dialysis: Secondary | ICD-10-CM | POA: Diagnosis not present

## 2021-04-14 DIAGNOSIS — N186 End stage renal disease: Secondary | ICD-10-CM | POA: Diagnosis not present

## 2021-04-15 DIAGNOSIS — Z992 Dependence on renal dialysis: Secondary | ICD-10-CM | POA: Diagnosis not present

## 2021-04-15 DIAGNOSIS — N186 End stage renal disease: Secondary | ICD-10-CM | POA: Diagnosis not present

## 2021-04-17 DIAGNOSIS — Z992 Dependence on renal dialysis: Secondary | ICD-10-CM | POA: Diagnosis not present

## 2021-04-17 DIAGNOSIS — N186 End stage renal disease: Secondary | ICD-10-CM | POA: Diagnosis not present

## 2021-04-20 DIAGNOSIS — Z992 Dependence on renal dialysis: Secondary | ICD-10-CM | POA: Diagnosis not present

## 2021-04-20 DIAGNOSIS — N186 End stage renal disease: Secondary | ICD-10-CM | POA: Diagnosis not present

## 2021-04-21 DIAGNOSIS — Z992 Dependence on renal dialysis: Secondary | ICD-10-CM | POA: Diagnosis not present

## 2021-04-21 DIAGNOSIS — N186 End stage renal disease: Secondary | ICD-10-CM | POA: Diagnosis not present

## 2021-04-27 DIAGNOSIS — Z992 Dependence on renal dialysis: Secondary | ICD-10-CM | POA: Diagnosis not present

## 2021-04-27 DIAGNOSIS — N186 End stage renal disease: Secondary | ICD-10-CM | POA: Diagnosis not present

## 2021-04-28 DIAGNOSIS — Z992 Dependence on renal dialysis: Secondary | ICD-10-CM | POA: Diagnosis not present

## 2021-04-28 DIAGNOSIS — N186 End stage renal disease: Secondary | ICD-10-CM | POA: Diagnosis not present

## 2021-04-29 DIAGNOSIS — N186 End stage renal disease: Secondary | ICD-10-CM | POA: Diagnosis not present

## 2021-04-29 DIAGNOSIS — Z992 Dependence on renal dialysis: Secondary | ICD-10-CM | POA: Diagnosis not present

## 2021-04-30 DIAGNOSIS — N186 End stage renal disease: Secondary | ICD-10-CM | POA: Diagnosis not present

## 2021-04-30 DIAGNOSIS — Z992 Dependence on renal dialysis: Secondary | ICD-10-CM | POA: Diagnosis not present

## 2021-05-01 DIAGNOSIS — Z992 Dependence on renal dialysis: Secondary | ICD-10-CM | POA: Diagnosis not present

## 2021-05-01 DIAGNOSIS — N186 End stage renal disease: Secondary | ICD-10-CM | POA: Diagnosis not present

## 2021-05-02 DIAGNOSIS — Z992 Dependence on renal dialysis: Secondary | ICD-10-CM | POA: Diagnosis not present

## 2021-05-02 DIAGNOSIS — N186 End stage renal disease: Secondary | ICD-10-CM | POA: Diagnosis not present

## 2021-05-03 DIAGNOSIS — N186 End stage renal disease: Secondary | ICD-10-CM | POA: Diagnosis not present

## 2021-05-03 DIAGNOSIS — Z992 Dependence on renal dialysis: Secondary | ICD-10-CM | POA: Diagnosis not present

## 2021-05-04 DIAGNOSIS — Z992 Dependence on renal dialysis: Secondary | ICD-10-CM | POA: Diagnosis not present

## 2021-05-04 DIAGNOSIS — N186 End stage renal disease: Secondary | ICD-10-CM | POA: Diagnosis not present

## 2021-05-05 DIAGNOSIS — Z992 Dependence on renal dialysis: Secondary | ICD-10-CM | POA: Diagnosis not present

## 2021-05-05 DIAGNOSIS — N186 End stage renal disease: Secondary | ICD-10-CM | POA: Diagnosis not present

## 2021-05-06 DIAGNOSIS — E119 Type 2 diabetes mellitus without complications: Secondary | ICD-10-CM | POA: Diagnosis not present

## 2021-05-06 DIAGNOSIS — Z992 Dependence on renal dialysis: Secondary | ICD-10-CM | POA: Diagnosis not present

## 2021-05-06 DIAGNOSIS — N186 End stage renal disease: Secondary | ICD-10-CM | POA: Diagnosis not present

## 2021-05-07 DIAGNOSIS — N186 End stage renal disease: Secondary | ICD-10-CM | POA: Diagnosis not present

## 2021-05-07 DIAGNOSIS — Z992 Dependence on renal dialysis: Secondary | ICD-10-CM | POA: Diagnosis not present

## 2021-05-08 DIAGNOSIS — N186 End stage renal disease: Secondary | ICD-10-CM | POA: Diagnosis not present

## 2021-05-08 DIAGNOSIS — Z992 Dependence on renal dialysis: Secondary | ICD-10-CM | POA: Diagnosis not present

## 2021-05-09 DIAGNOSIS — N186 End stage renal disease: Secondary | ICD-10-CM | POA: Diagnosis not present

## 2021-05-09 DIAGNOSIS — Z992 Dependence on renal dialysis: Secondary | ICD-10-CM | POA: Diagnosis not present

## 2021-05-10 DIAGNOSIS — N186 End stage renal disease: Secondary | ICD-10-CM | POA: Diagnosis not present

## 2021-05-10 DIAGNOSIS — Z992 Dependence on renal dialysis: Secondary | ICD-10-CM | POA: Diagnosis not present

## 2021-05-11 DIAGNOSIS — N186 End stage renal disease: Secondary | ICD-10-CM | POA: Diagnosis not present

## 2021-05-11 DIAGNOSIS — Z992 Dependence on renal dialysis: Secondary | ICD-10-CM | POA: Diagnosis not present

## 2021-05-12 DIAGNOSIS — N186 End stage renal disease: Secondary | ICD-10-CM | POA: Diagnosis not present

## 2021-05-12 DIAGNOSIS — Z992 Dependence on renal dialysis: Secondary | ICD-10-CM | POA: Diagnosis not present

## 2021-05-13 DIAGNOSIS — Z992 Dependence on renal dialysis: Secondary | ICD-10-CM | POA: Diagnosis not present

## 2021-05-13 DIAGNOSIS — N186 End stage renal disease: Secondary | ICD-10-CM | POA: Diagnosis not present

## 2021-05-14 DIAGNOSIS — N186 End stage renal disease: Secondary | ICD-10-CM | POA: Diagnosis not present

## 2021-05-14 DIAGNOSIS — Z992 Dependence on renal dialysis: Secondary | ICD-10-CM | POA: Diagnosis not present

## 2021-05-15 DIAGNOSIS — Z992 Dependence on renal dialysis: Secondary | ICD-10-CM | POA: Diagnosis not present

## 2021-05-15 DIAGNOSIS — N186 End stage renal disease: Secondary | ICD-10-CM | POA: Diagnosis not present

## 2021-05-16 DIAGNOSIS — N186 End stage renal disease: Secondary | ICD-10-CM | POA: Diagnosis not present

## 2021-05-16 DIAGNOSIS — Z992 Dependence on renal dialysis: Secondary | ICD-10-CM | POA: Diagnosis not present

## 2021-05-17 DIAGNOSIS — Z992 Dependence on renal dialysis: Secondary | ICD-10-CM | POA: Diagnosis not present

## 2021-05-17 DIAGNOSIS — N186 End stage renal disease: Secondary | ICD-10-CM | POA: Diagnosis not present

## 2021-05-18 DIAGNOSIS — N186 End stage renal disease: Secondary | ICD-10-CM | POA: Diagnosis not present

## 2021-05-18 DIAGNOSIS — Z992 Dependence on renal dialysis: Secondary | ICD-10-CM | POA: Diagnosis not present

## 2021-05-19 DIAGNOSIS — Z992 Dependence on renal dialysis: Secondary | ICD-10-CM | POA: Diagnosis not present

## 2021-05-19 DIAGNOSIS — N186 End stage renal disease: Secondary | ICD-10-CM | POA: Diagnosis not present

## 2021-05-20 DIAGNOSIS — Z992 Dependence on renal dialysis: Secondary | ICD-10-CM | POA: Diagnosis not present

## 2021-05-20 DIAGNOSIS — N186 End stage renal disease: Secondary | ICD-10-CM | POA: Diagnosis not present

## 2021-05-21 DIAGNOSIS — N186 End stage renal disease: Secondary | ICD-10-CM | POA: Diagnosis not present

## 2021-05-21 DIAGNOSIS — Z992 Dependence on renal dialysis: Secondary | ICD-10-CM | POA: Diagnosis not present

## 2021-05-22 DIAGNOSIS — N186 End stage renal disease: Secondary | ICD-10-CM | POA: Diagnosis not present

## 2021-05-22 DIAGNOSIS — Z992 Dependence on renal dialysis: Secondary | ICD-10-CM | POA: Diagnosis not present

## 2021-05-23 DIAGNOSIS — Z992 Dependence on renal dialysis: Secondary | ICD-10-CM | POA: Diagnosis not present

## 2021-05-23 DIAGNOSIS — N186 End stage renal disease: Secondary | ICD-10-CM | POA: Diagnosis not present

## 2021-05-24 DIAGNOSIS — Z992 Dependence on renal dialysis: Secondary | ICD-10-CM | POA: Diagnosis not present

## 2021-05-24 DIAGNOSIS — N186 End stage renal disease: Secondary | ICD-10-CM | POA: Diagnosis not present

## 2021-05-25 DIAGNOSIS — Z992 Dependence on renal dialysis: Secondary | ICD-10-CM | POA: Diagnosis not present

## 2021-05-25 DIAGNOSIS — N186 End stage renal disease: Secondary | ICD-10-CM | POA: Diagnosis not present

## 2021-05-26 DIAGNOSIS — N186 End stage renal disease: Secondary | ICD-10-CM | POA: Diagnosis not present

## 2021-05-26 DIAGNOSIS — Z992 Dependence on renal dialysis: Secondary | ICD-10-CM | POA: Diagnosis not present

## 2021-05-27 DIAGNOSIS — Z992 Dependence on renal dialysis: Secondary | ICD-10-CM | POA: Diagnosis not present

## 2021-05-27 DIAGNOSIS — N186 End stage renal disease: Secondary | ICD-10-CM | POA: Diagnosis not present

## 2021-05-28 DIAGNOSIS — Z992 Dependence on renal dialysis: Secondary | ICD-10-CM | POA: Diagnosis not present

## 2021-05-28 DIAGNOSIS — N186 End stage renal disease: Secondary | ICD-10-CM | POA: Diagnosis not present

## 2021-05-29 DIAGNOSIS — N186 End stage renal disease: Secondary | ICD-10-CM | POA: Diagnosis not present

## 2021-05-29 DIAGNOSIS — Z992 Dependence on renal dialysis: Secondary | ICD-10-CM | POA: Diagnosis not present

## 2021-05-30 DIAGNOSIS — Z992 Dependence on renal dialysis: Secondary | ICD-10-CM | POA: Diagnosis not present

## 2021-05-30 DIAGNOSIS — N186 End stage renal disease: Secondary | ICD-10-CM | POA: Diagnosis not present

## 2021-05-31 DIAGNOSIS — N186 End stage renal disease: Secondary | ICD-10-CM | POA: Diagnosis not present

## 2021-05-31 DIAGNOSIS — Z992 Dependence on renal dialysis: Secondary | ICD-10-CM | POA: Diagnosis not present

## 2021-06-01 DIAGNOSIS — N186 End stage renal disease: Secondary | ICD-10-CM | POA: Diagnosis not present

## 2021-06-01 DIAGNOSIS — Z992 Dependence on renal dialysis: Secondary | ICD-10-CM | POA: Diagnosis not present

## 2021-06-01 DIAGNOSIS — Z23 Encounter for immunization: Secondary | ICD-10-CM | POA: Diagnosis not present

## 2021-06-02 DIAGNOSIS — Z992 Dependence on renal dialysis: Secondary | ICD-10-CM | POA: Diagnosis not present

## 2021-06-02 DIAGNOSIS — Z23 Encounter for immunization: Secondary | ICD-10-CM | POA: Diagnosis not present

## 2021-06-02 DIAGNOSIS — N186 End stage renal disease: Secondary | ICD-10-CM | POA: Diagnosis not present

## 2021-06-03 DIAGNOSIS — Z23 Encounter for immunization: Secondary | ICD-10-CM | POA: Diagnosis not present

## 2021-06-03 DIAGNOSIS — Z992 Dependence on renal dialysis: Secondary | ICD-10-CM | POA: Diagnosis not present

## 2021-06-03 DIAGNOSIS — N186 End stage renal disease: Secondary | ICD-10-CM | POA: Diagnosis not present

## 2021-06-04 DIAGNOSIS — N186 End stage renal disease: Secondary | ICD-10-CM | POA: Diagnosis not present

## 2021-06-04 DIAGNOSIS — Z23 Encounter for immunization: Secondary | ICD-10-CM | POA: Diagnosis not present

## 2021-06-04 DIAGNOSIS — Z992 Dependence on renal dialysis: Secondary | ICD-10-CM | POA: Diagnosis not present

## 2021-06-05 DIAGNOSIS — Z992 Dependence on renal dialysis: Secondary | ICD-10-CM | POA: Diagnosis not present

## 2021-06-05 DIAGNOSIS — N186 End stage renal disease: Secondary | ICD-10-CM | POA: Diagnosis not present

## 2021-06-05 DIAGNOSIS — Z23 Encounter for immunization: Secondary | ICD-10-CM | POA: Diagnosis not present

## 2021-06-06 DIAGNOSIS — Z992 Dependence on renal dialysis: Secondary | ICD-10-CM | POA: Diagnosis not present

## 2021-06-06 DIAGNOSIS — N186 End stage renal disease: Secondary | ICD-10-CM | POA: Diagnosis not present

## 2021-06-06 DIAGNOSIS — Z23 Encounter for immunization: Secondary | ICD-10-CM | POA: Diagnosis not present

## 2021-06-07 DIAGNOSIS — N186 End stage renal disease: Secondary | ICD-10-CM | POA: Diagnosis not present

## 2021-06-07 DIAGNOSIS — Z992 Dependence on renal dialysis: Secondary | ICD-10-CM | POA: Diagnosis not present

## 2021-06-07 DIAGNOSIS — Z23 Encounter for immunization: Secondary | ICD-10-CM | POA: Diagnosis not present

## 2021-06-08 DIAGNOSIS — Z992 Dependence on renal dialysis: Secondary | ICD-10-CM | POA: Diagnosis not present

## 2021-06-08 DIAGNOSIS — Z23 Encounter for immunization: Secondary | ICD-10-CM | POA: Diagnosis not present

## 2021-06-08 DIAGNOSIS — N186 End stage renal disease: Secondary | ICD-10-CM | POA: Diagnosis not present

## 2021-06-09 DIAGNOSIS — N186 End stage renal disease: Secondary | ICD-10-CM | POA: Diagnosis not present

## 2021-06-09 DIAGNOSIS — Z23 Encounter for immunization: Secondary | ICD-10-CM | POA: Diagnosis not present

## 2021-06-09 DIAGNOSIS — Z992 Dependence on renal dialysis: Secondary | ICD-10-CM | POA: Diagnosis not present

## 2021-06-10 DIAGNOSIS — Z23 Encounter for immunization: Secondary | ICD-10-CM | POA: Diagnosis not present

## 2021-06-10 DIAGNOSIS — Z992 Dependence on renal dialysis: Secondary | ICD-10-CM | POA: Diagnosis not present

## 2021-06-10 DIAGNOSIS — N186 End stage renal disease: Secondary | ICD-10-CM | POA: Diagnosis not present

## 2021-06-11 DIAGNOSIS — Z992 Dependence on renal dialysis: Secondary | ICD-10-CM | POA: Diagnosis not present

## 2021-06-11 DIAGNOSIS — N186 End stage renal disease: Secondary | ICD-10-CM | POA: Diagnosis not present

## 2021-06-11 DIAGNOSIS — Z23 Encounter for immunization: Secondary | ICD-10-CM | POA: Diagnosis not present

## 2021-06-12 DIAGNOSIS — Z992 Dependence on renal dialysis: Secondary | ICD-10-CM | POA: Diagnosis not present

## 2021-06-12 DIAGNOSIS — N186 End stage renal disease: Secondary | ICD-10-CM | POA: Diagnosis not present

## 2021-06-12 DIAGNOSIS — Z23 Encounter for immunization: Secondary | ICD-10-CM | POA: Diagnosis not present

## 2021-06-13 DIAGNOSIS — Z23 Encounter for immunization: Secondary | ICD-10-CM | POA: Diagnosis not present

## 2021-06-13 DIAGNOSIS — N186 End stage renal disease: Secondary | ICD-10-CM | POA: Diagnosis not present

## 2021-06-13 DIAGNOSIS — Z992 Dependence on renal dialysis: Secondary | ICD-10-CM | POA: Diagnosis not present

## 2021-06-14 DIAGNOSIS — Z23 Encounter for immunization: Secondary | ICD-10-CM | POA: Diagnosis not present

## 2021-06-14 DIAGNOSIS — N186 End stage renal disease: Secondary | ICD-10-CM | POA: Diagnosis not present

## 2021-06-14 DIAGNOSIS — Z992 Dependence on renal dialysis: Secondary | ICD-10-CM | POA: Diagnosis not present

## 2021-06-15 DIAGNOSIS — Z992 Dependence on renal dialysis: Secondary | ICD-10-CM | POA: Diagnosis not present

## 2021-06-15 DIAGNOSIS — N186 End stage renal disease: Secondary | ICD-10-CM | POA: Diagnosis not present

## 2021-06-15 DIAGNOSIS — Z23 Encounter for immunization: Secondary | ICD-10-CM | POA: Diagnosis not present

## 2021-06-16 DIAGNOSIS — N186 End stage renal disease: Secondary | ICD-10-CM | POA: Diagnosis not present

## 2021-06-16 DIAGNOSIS — Z992 Dependence on renal dialysis: Secondary | ICD-10-CM | POA: Diagnosis not present

## 2021-06-16 DIAGNOSIS — Z23 Encounter for immunization: Secondary | ICD-10-CM | POA: Diagnosis not present

## 2021-06-17 DIAGNOSIS — Z23 Encounter for immunization: Secondary | ICD-10-CM | POA: Diagnosis not present

## 2021-06-17 DIAGNOSIS — N186 End stage renal disease: Secondary | ICD-10-CM | POA: Diagnosis not present

## 2021-06-17 DIAGNOSIS — Z992 Dependence on renal dialysis: Secondary | ICD-10-CM | POA: Diagnosis not present

## 2021-06-18 DIAGNOSIS — N186 End stage renal disease: Secondary | ICD-10-CM | POA: Diagnosis not present

## 2021-06-18 DIAGNOSIS — Z23 Encounter for immunization: Secondary | ICD-10-CM | POA: Diagnosis not present

## 2021-06-18 DIAGNOSIS — Z992 Dependence on renal dialysis: Secondary | ICD-10-CM | POA: Diagnosis not present

## 2021-06-19 DIAGNOSIS — Z992 Dependence on renal dialysis: Secondary | ICD-10-CM | POA: Diagnosis not present

## 2021-06-19 DIAGNOSIS — N186 End stage renal disease: Secondary | ICD-10-CM | POA: Diagnosis not present

## 2021-06-19 DIAGNOSIS — Z23 Encounter for immunization: Secondary | ICD-10-CM | POA: Diagnosis not present

## 2021-06-20 DIAGNOSIS — N186 End stage renal disease: Secondary | ICD-10-CM | POA: Diagnosis not present

## 2021-06-20 DIAGNOSIS — Z992 Dependence on renal dialysis: Secondary | ICD-10-CM | POA: Diagnosis not present

## 2021-06-20 DIAGNOSIS — Z23 Encounter for immunization: Secondary | ICD-10-CM | POA: Diagnosis not present

## 2021-06-21 DIAGNOSIS — Z992 Dependence on renal dialysis: Secondary | ICD-10-CM | POA: Diagnosis not present

## 2021-06-21 DIAGNOSIS — N186 End stage renal disease: Secondary | ICD-10-CM | POA: Diagnosis not present

## 2021-06-21 DIAGNOSIS — Z23 Encounter for immunization: Secondary | ICD-10-CM | POA: Diagnosis not present

## 2021-06-22 DIAGNOSIS — N186 End stage renal disease: Secondary | ICD-10-CM | POA: Diagnosis not present

## 2021-06-22 DIAGNOSIS — Z992 Dependence on renal dialysis: Secondary | ICD-10-CM | POA: Diagnosis not present

## 2021-06-22 DIAGNOSIS — Z23 Encounter for immunization: Secondary | ICD-10-CM | POA: Diagnosis not present

## 2021-06-23 DIAGNOSIS — N186 End stage renal disease: Secondary | ICD-10-CM | POA: Diagnosis not present

## 2021-06-23 DIAGNOSIS — Z992 Dependence on renal dialysis: Secondary | ICD-10-CM | POA: Diagnosis not present

## 2021-06-23 DIAGNOSIS — Z23 Encounter for immunization: Secondary | ICD-10-CM | POA: Diagnosis not present

## 2021-06-24 DIAGNOSIS — Z992 Dependence on renal dialysis: Secondary | ICD-10-CM | POA: Diagnosis not present

## 2021-06-24 DIAGNOSIS — N186 End stage renal disease: Secondary | ICD-10-CM | POA: Diagnosis not present

## 2021-06-24 DIAGNOSIS — Z23 Encounter for immunization: Secondary | ICD-10-CM | POA: Diagnosis not present

## 2021-06-25 DIAGNOSIS — Z23 Encounter for immunization: Secondary | ICD-10-CM | POA: Diagnosis not present

## 2021-06-25 DIAGNOSIS — Z992 Dependence on renal dialysis: Secondary | ICD-10-CM | POA: Diagnosis not present

## 2021-06-25 DIAGNOSIS — N186 End stage renal disease: Secondary | ICD-10-CM | POA: Diagnosis not present

## 2021-06-26 DIAGNOSIS — N186 End stage renal disease: Secondary | ICD-10-CM | POA: Diagnosis not present

## 2021-06-26 DIAGNOSIS — Z23 Encounter for immunization: Secondary | ICD-10-CM | POA: Diagnosis not present

## 2021-06-26 DIAGNOSIS — Z992 Dependence on renal dialysis: Secondary | ICD-10-CM | POA: Diagnosis not present

## 2021-06-27 DIAGNOSIS — N186 End stage renal disease: Secondary | ICD-10-CM | POA: Diagnosis not present

## 2021-06-27 DIAGNOSIS — Z992 Dependence on renal dialysis: Secondary | ICD-10-CM | POA: Diagnosis not present

## 2021-06-27 DIAGNOSIS — Z23 Encounter for immunization: Secondary | ICD-10-CM | POA: Diagnosis not present

## 2021-06-28 DIAGNOSIS — Z23 Encounter for immunization: Secondary | ICD-10-CM | POA: Diagnosis not present

## 2021-06-28 DIAGNOSIS — Z992 Dependence on renal dialysis: Secondary | ICD-10-CM | POA: Diagnosis not present

## 2021-06-28 DIAGNOSIS — N186 End stage renal disease: Secondary | ICD-10-CM | POA: Diagnosis not present

## 2021-06-29 DIAGNOSIS — Z992 Dependence on renal dialysis: Secondary | ICD-10-CM | POA: Diagnosis not present

## 2021-06-29 DIAGNOSIS — Z23 Encounter for immunization: Secondary | ICD-10-CM | POA: Diagnosis not present

## 2021-06-29 DIAGNOSIS — N186 End stage renal disease: Secondary | ICD-10-CM | POA: Diagnosis not present

## 2021-06-30 DIAGNOSIS — Z992 Dependence on renal dialysis: Secondary | ICD-10-CM | POA: Diagnosis not present

## 2021-06-30 DIAGNOSIS — N186 End stage renal disease: Secondary | ICD-10-CM | POA: Diagnosis not present

## 2021-06-30 DIAGNOSIS — Z23 Encounter for immunization: Secondary | ICD-10-CM | POA: Diagnosis not present

## 2021-07-01 DIAGNOSIS — Z992 Dependence on renal dialysis: Secondary | ICD-10-CM | POA: Diagnosis not present

## 2021-07-01 DIAGNOSIS — N186 End stage renal disease: Secondary | ICD-10-CM | POA: Diagnosis not present

## 2021-07-01 DIAGNOSIS — Z23 Encounter for immunization: Secondary | ICD-10-CM | POA: Diagnosis not present

## 2021-07-02 DIAGNOSIS — N186 End stage renal disease: Secondary | ICD-10-CM | POA: Diagnosis not present

## 2021-07-02 DIAGNOSIS — Z992 Dependence on renal dialysis: Secondary | ICD-10-CM | POA: Diagnosis not present

## 2021-07-03 DIAGNOSIS — Z992 Dependence on renal dialysis: Secondary | ICD-10-CM | POA: Diagnosis not present

## 2021-07-03 DIAGNOSIS — N186 End stage renal disease: Secondary | ICD-10-CM | POA: Diagnosis not present

## 2021-07-04 DIAGNOSIS — N186 End stage renal disease: Secondary | ICD-10-CM | POA: Diagnosis not present

## 2021-07-04 DIAGNOSIS — Z992 Dependence on renal dialysis: Secondary | ICD-10-CM | POA: Diagnosis not present

## 2021-07-05 DIAGNOSIS — N186 End stage renal disease: Secondary | ICD-10-CM | POA: Diagnosis not present

## 2021-07-05 DIAGNOSIS — Z992 Dependence on renal dialysis: Secondary | ICD-10-CM | POA: Diagnosis not present

## 2021-07-06 DIAGNOSIS — Z992 Dependence on renal dialysis: Secondary | ICD-10-CM | POA: Diagnosis not present

## 2021-07-06 DIAGNOSIS — N186 End stage renal disease: Secondary | ICD-10-CM | POA: Diagnosis not present

## 2021-07-07 DIAGNOSIS — Z992 Dependence on renal dialysis: Secondary | ICD-10-CM | POA: Diagnosis not present

## 2021-07-07 DIAGNOSIS — N186 End stage renal disease: Secondary | ICD-10-CM | POA: Diagnosis not present

## 2021-07-08 DIAGNOSIS — Z992 Dependence on renal dialysis: Secondary | ICD-10-CM | POA: Diagnosis not present

## 2021-07-08 DIAGNOSIS — N186 End stage renal disease: Secondary | ICD-10-CM | POA: Diagnosis not present

## 2021-07-09 DIAGNOSIS — Z992 Dependence on renal dialysis: Secondary | ICD-10-CM | POA: Diagnosis not present

## 2021-07-09 DIAGNOSIS — N186 End stage renal disease: Secondary | ICD-10-CM | POA: Diagnosis not present

## 2021-07-10 DIAGNOSIS — N186 End stage renal disease: Secondary | ICD-10-CM | POA: Diagnosis not present

## 2021-07-10 DIAGNOSIS — Z992 Dependence on renal dialysis: Secondary | ICD-10-CM | POA: Diagnosis not present

## 2021-07-11 DIAGNOSIS — N186 End stage renal disease: Secondary | ICD-10-CM | POA: Diagnosis not present

## 2021-07-11 DIAGNOSIS — Z992 Dependence on renal dialysis: Secondary | ICD-10-CM | POA: Diagnosis not present

## 2021-07-12 DIAGNOSIS — N186 End stage renal disease: Secondary | ICD-10-CM | POA: Diagnosis not present

## 2021-07-12 DIAGNOSIS — Z992 Dependence on renal dialysis: Secondary | ICD-10-CM | POA: Diagnosis not present

## 2021-07-13 DIAGNOSIS — Z992 Dependence on renal dialysis: Secondary | ICD-10-CM | POA: Diagnosis not present

## 2021-07-13 DIAGNOSIS — N186 End stage renal disease: Secondary | ICD-10-CM | POA: Diagnosis not present

## 2021-07-14 DIAGNOSIS — N186 End stage renal disease: Secondary | ICD-10-CM | POA: Diagnosis not present

## 2021-07-14 DIAGNOSIS — Z992 Dependence on renal dialysis: Secondary | ICD-10-CM | POA: Diagnosis not present

## 2021-07-15 DIAGNOSIS — N186 End stage renal disease: Secondary | ICD-10-CM | POA: Diagnosis not present

## 2021-07-15 DIAGNOSIS — Z992 Dependence on renal dialysis: Secondary | ICD-10-CM | POA: Diagnosis not present

## 2021-07-16 DIAGNOSIS — E785 Hyperlipidemia, unspecified: Secondary | ICD-10-CM | POA: Diagnosis not present

## 2021-07-16 DIAGNOSIS — N2581 Secondary hyperparathyroidism of renal origin: Secondary | ICD-10-CM | POA: Diagnosis not present

## 2021-07-16 DIAGNOSIS — N186 End stage renal disease: Secondary | ICD-10-CM | POA: Diagnosis not present

## 2021-07-16 DIAGNOSIS — Z992 Dependence on renal dialysis: Secondary | ICD-10-CM | POA: Diagnosis not present

## 2021-07-16 DIAGNOSIS — D8481 Immunodeficiency due to conditions classified elsewhere: Secondary | ICD-10-CM | POA: Diagnosis not present

## 2021-07-16 DIAGNOSIS — I120 Hypertensive chronic kidney disease with stage 5 chronic kidney disease or end stage renal disease: Secondary | ICD-10-CM | POA: Diagnosis not present

## 2021-07-17 DIAGNOSIS — Z992 Dependence on renal dialysis: Secondary | ICD-10-CM | POA: Diagnosis not present

## 2021-07-17 DIAGNOSIS — N186 End stage renal disease: Secondary | ICD-10-CM | POA: Diagnosis not present

## 2021-07-18 DIAGNOSIS — Z992 Dependence on renal dialysis: Secondary | ICD-10-CM | POA: Diagnosis not present

## 2021-07-18 DIAGNOSIS — N186 End stage renal disease: Secondary | ICD-10-CM | POA: Diagnosis not present

## 2021-07-19 DIAGNOSIS — Z992 Dependence on renal dialysis: Secondary | ICD-10-CM | POA: Diagnosis not present

## 2021-07-19 DIAGNOSIS — N186 End stage renal disease: Secondary | ICD-10-CM | POA: Diagnosis not present

## 2021-07-20 DIAGNOSIS — N186 End stage renal disease: Secondary | ICD-10-CM | POA: Diagnosis not present

## 2021-07-20 DIAGNOSIS — Z992 Dependence on renal dialysis: Secondary | ICD-10-CM | POA: Diagnosis not present

## 2021-07-21 DIAGNOSIS — Z992 Dependence on renal dialysis: Secondary | ICD-10-CM | POA: Diagnosis not present

## 2021-07-21 DIAGNOSIS — N186 End stage renal disease: Secondary | ICD-10-CM | POA: Diagnosis not present

## 2021-07-22 DIAGNOSIS — N186 End stage renal disease: Secondary | ICD-10-CM | POA: Diagnosis not present

## 2021-07-22 DIAGNOSIS — Z992 Dependence on renal dialysis: Secondary | ICD-10-CM | POA: Diagnosis not present

## 2021-07-23 DIAGNOSIS — N186 End stage renal disease: Secondary | ICD-10-CM | POA: Diagnosis not present

## 2021-07-23 DIAGNOSIS — Z992 Dependence on renal dialysis: Secondary | ICD-10-CM | POA: Diagnosis not present

## 2021-07-24 DIAGNOSIS — N186 End stage renal disease: Secondary | ICD-10-CM | POA: Diagnosis not present

## 2021-07-24 DIAGNOSIS — Z992 Dependence on renal dialysis: Secondary | ICD-10-CM | POA: Diagnosis not present

## 2021-07-25 DIAGNOSIS — Z992 Dependence on renal dialysis: Secondary | ICD-10-CM | POA: Diagnosis not present

## 2021-07-25 DIAGNOSIS — N186 End stage renal disease: Secondary | ICD-10-CM | POA: Diagnosis not present

## 2021-07-26 DIAGNOSIS — N186 End stage renal disease: Secondary | ICD-10-CM | POA: Diagnosis not present

## 2021-07-26 DIAGNOSIS — Z992 Dependence on renal dialysis: Secondary | ICD-10-CM | POA: Diagnosis not present

## 2021-07-27 DIAGNOSIS — N186 End stage renal disease: Secondary | ICD-10-CM | POA: Diagnosis not present

## 2021-07-27 DIAGNOSIS — Z992 Dependence on renal dialysis: Secondary | ICD-10-CM | POA: Diagnosis not present

## 2021-07-28 DIAGNOSIS — N186 End stage renal disease: Secondary | ICD-10-CM | POA: Diagnosis not present

## 2021-07-28 DIAGNOSIS — Z992 Dependence on renal dialysis: Secondary | ICD-10-CM | POA: Diagnosis not present

## 2021-07-29 DIAGNOSIS — Z992 Dependence on renal dialysis: Secondary | ICD-10-CM | POA: Diagnosis not present

## 2021-07-29 DIAGNOSIS — N186 End stage renal disease: Secondary | ICD-10-CM | POA: Diagnosis not present

## 2021-07-30 DIAGNOSIS — N186 End stage renal disease: Secondary | ICD-10-CM | POA: Diagnosis not present

## 2021-07-30 DIAGNOSIS — Z992 Dependence on renal dialysis: Secondary | ICD-10-CM | POA: Diagnosis not present

## 2021-07-31 DIAGNOSIS — N186 End stage renal disease: Secondary | ICD-10-CM | POA: Diagnosis not present

## 2021-07-31 DIAGNOSIS — Z992 Dependence on renal dialysis: Secondary | ICD-10-CM | POA: Diagnosis not present

## 2021-08-01 DIAGNOSIS — N186 End stage renal disease: Secondary | ICD-10-CM | POA: Diagnosis not present

## 2021-08-01 DIAGNOSIS — Z992 Dependence on renal dialysis: Secondary | ICD-10-CM | POA: Diagnosis not present

## 2021-08-02 DIAGNOSIS — Z992 Dependence on renal dialysis: Secondary | ICD-10-CM | POA: Diagnosis not present

## 2021-08-02 DIAGNOSIS — N186 End stage renal disease: Secondary | ICD-10-CM | POA: Diagnosis not present

## 2021-08-03 DIAGNOSIS — N186 End stage renal disease: Secondary | ICD-10-CM | POA: Diagnosis not present

## 2021-08-03 DIAGNOSIS — Z992 Dependence on renal dialysis: Secondary | ICD-10-CM | POA: Diagnosis not present

## 2021-08-04 DIAGNOSIS — N186 End stage renal disease: Secondary | ICD-10-CM | POA: Diagnosis not present

## 2021-08-04 DIAGNOSIS — Z992 Dependence on renal dialysis: Secondary | ICD-10-CM | POA: Diagnosis not present

## 2021-08-05 DIAGNOSIS — E119 Type 2 diabetes mellitus without complications: Secondary | ICD-10-CM | POA: Diagnosis not present

## 2021-08-05 DIAGNOSIS — N186 End stage renal disease: Secondary | ICD-10-CM | POA: Diagnosis not present

## 2021-08-05 DIAGNOSIS — Z13220 Encounter for screening for lipoid disorders: Secondary | ICD-10-CM | POA: Diagnosis not present

## 2021-08-05 DIAGNOSIS — Z992 Dependence on renal dialysis: Secondary | ICD-10-CM | POA: Diagnosis not present

## 2021-08-06 DIAGNOSIS — N186 End stage renal disease: Secondary | ICD-10-CM | POA: Diagnosis not present

## 2021-08-06 DIAGNOSIS — Z992 Dependence on renal dialysis: Secondary | ICD-10-CM | POA: Diagnosis not present

## 2021-08-07 DIAGNOSIS — N186 End stage renal disease: Secondary | ICD-10-CM | POA: Diagnosis not present

## 2021-08-07 DIAGNOSIS — Z992 Dependence on renal dialysis: Secondary | ICD-10-CM | POA: Diagnosis not present

## 2021-08-08 DIAGNOSIS — Z992 Dependence on renal dialysis: Secondary | ICD-10-CM | POA: Diagnosis not present

## 2021-08-08 DIAGNOSIS — N186 End stage renal disease: Secondary | ICD-10-CM | POA: Diagnosis not present

## 2021-08-09 DIAGNOSIS — Z992 Dependence on renal dialysis: Secondary | ICD-10-CM | POA: Diagnosis not present

## 2021-08-09 DIAGNOSIS — N186 End stage renal disease: Secondary | ICD-10-CM | POA: Diagnosis not present

## 2021-08-10 DIAGNOSIS — Z992 Dependence on renal dialysis: Secondary | ICD-10-CM | POA: Diagnosis not present

## 2021-08-10 DIAGNOSIS — N186 End stage renal disease: Secondary | ICD-10-CM | POA: Diagnosis not present

## 2021-08-11 DIAGNOSIS — N186 End stage renal disease: Secondary | ICD-10-CM | POA: Diagnosis not present

## 2021-08-11 DIAGNOSIS — Z992 Dependence on renal dialysis: Secondary | ICD-10-CM | POA: Diagnosis not present

## 2021-08-12 DIAGNOSIS — N186 End stage renal disease: Secondary | ICD-10-CM | POA: Diagnosis not present

## 2021-08-12 DIAGNOSIS — Z992 Dependence on renal dialysis: Secondary | ICD-10-CM | POA: Diagnosis not present

## 2021-08-13 DIAGNOSIS — Z992 Dependence on renal dialysis: Secondary | ICD-10-CM | POA: Diagnosis not present

## 2021-08-13 DIAGNOSIS — N186 End stage renal disease: Secondary | ICD-10-CM | POA: Diagnosis not present

## 2021-08-14 DIAGNOSIS — N186 End stage renal disease: Secondary | ICD-10-CM | POA: Diagnosis not present

## 2021-08-14 DIAGNOSIS — Z992 Dependence on renal dialysis: Secondary | ICD-10-CM | POA: Diagnosis not present

## 2021-08-15 DIAGNOSIS — N186 End stage renal disease: Secondary | ICD-10-CM | POA: Diagnosis not present

## 2021-08-15 DIAGNOSIS — Z992 Dependence on renal dialysis: Secondary | ICD-10-CM | POA: Diagnosis not present

## 2021-08-16 DIAGNOSIS — N186 End stage renal disease: Secondary | ICD-10-CM | POA: Diagnosis not present

## 2021-08-16 DIAGNOSIS — Z992 Dependence on renal dialysis: Secondary | ICD-10-CM | POA: Diagnosis not present

## 2021-08-17 DIAGNOSIS — N186 End stage renal disease: Secondary | ICD-10-CM | POA: Diagnosis not present

## 2021-08-17 DIAGNOSIS — Z992 Dependence on renal dialysis: Secondary | ICD-10-CM | POA: Diagnosis not present

## 2021-08-18 DIAGNOSIS — Z992 Dependence on renal dialysis: Secondary | ICD-10-CM | POA: Diagnosis not present

## 2021-08-18 DIAGNOSIS — N186 End stage renal disease: Secondary | ICD-10-CM | POA: Diagnosis not present

## 2021-08-19 DIAGNOSIS — Z992 Dependence on renal dialysis: Secondary | ICD-10-CM | POA: Diagnosis not present

## 2021-08-19 DIAGNOSIS — N186 End stage renal disease: Secondary | ICD-10-CM | POA: Diagnosis not present

## 2021-08-20 DIAGNOSIS — N186 End stage renal disease: Secondary | ICD-10-CM | POA: Diagnosis not present

## 2021-08-20 DIAGNOSIS — Z992 Dependence on renal dialysis: Secondary | ICD-10-CM | POA: Diagnosis not present

## 2021-08-21 DIAGNOSIS — Z992 Dependence on renal dialysis: Secondary | ICD-10-CM | POA: Diagnosis not present

## 2021-08-21 DIAGNOSIS — N186 End stage renal disease: Secondary | ICD-10-CM | POA: Diagnosis not present

## 2021-08-22 DIAGNOSIS — Z992 Dependence on renal dialysis: Secondary | ICD-10-CM | POA: Diagnosis not present

## 2021-08-22 DIAGNOSIS — N186 End stage renal disease: Secondary | ICD-10-CM | POA: Diagnosis not present

## 2021-08-23 DIAGNOSIS — Z992 Dependence on renal dialysis: Secondary | ICD-10-CM | POA: Diagnosis not present

## 2021-08-23 DIAGNOSIS — N186 End stage renal disease: Secondary | ICD-10-CM | POA: Diagnosis not present

## 2021-08-24 DIAGNOSIS — Z992 Dependence on renal dialysis: Secondary | ICD-10-CM | POA: Diagnosis not present

## 2021-08-24 DIAGNOSIS — N186 End stage renal disease: Secondary | ICD-10-CM | POA: Diagnosis not present

## 2021-08-25 DIAGNOSIS — N186 End stage renal disease: Secondary | ICD-10-CM | POA: Diagnosis not present

## 2021-08-25 DIAGNOSIS — Z992 Dependence on renal dialysis: Secondary | ICD-10-CM | POA: Diagnosis not present

## 2021-08-26 ENCOUNTER — Other Ambulatory Visit: Payer: Self-pay | Admitting: Urology

## 2021-08-26 DIAGNOSIS — N186 End stage renal disease: Secondary | ICD-10-CM | POA: Diagnosis not present

## 2021-08-26 DIAGNOSIS — Z992 Dependence on renal dialysis: Secondary | ICD-10-CM | POA: Diagnosis not present

## 2021-08-26 DIAGNOSIS — N2889 Other specified disorders of kidney and ureter: Secondary | ICD-10-CM

## 2021-08-27 DIAGNOSIS — Z992 Dependence on renal dialysis: Secondary | ICD-10-CM | POA: Diagnosis not present

## 2021-08-27 DIAGNOSIS — N186 End stage renal disease: Secondary | ICD-10-CM | POA: Diagnosis not present

## 2021-08-28 DIAGNOSIS — N186 End stage renal disease: Secondary | ICD-10-CM | POA: Diagnosis not present

## 2021-08-28 DIAGNOSIS — Z992 Dependence on renal dialysis: Secondary | ICD-10-CM | POA: Diagnosis not present

## 2021-08-29 DIAGNOSIS — N186 End stage renal disease: Secondary | ICD-10-CM | POA: Diagnosis not present

## 2021-08-29 DIAGNOSIS — Z992 Dependence on renal dialysis: Secondary | ICD-10-CM | POA: Diagnosis not present

## 2021-08-30 DIAGNOSIS — N186 End stage renal disease: Secondary | ICD-10-CM | POA: Diagnosis not present

## 2021-08-30 DIAGNOSIS — Z992 Dependence on renal dialysis: Secondary | ICD-10-CM | POA: Diagnosis not present

## 2021-08-31 DIAGNOSIS — N186 End stage renal disease: Secondary | ICD-10-CM | POA: Diagnosis not present

## 2021-08-31 DIAGNOSIS — Z992 Dependence on renal dialysis: Secondary | ICD-10-CM | POA: Diagnosis not present

## 2021-09-01 DIAGNOSIS — Z992 Dependence on renal dialysis: Secondary | ICD-10-CM | POA: Diagnosis not present

## 2021-09-01 DIAGNOSIS — N186 End stage renal disease: Secondary | ICD-10-CM | POA: Diagnosis not present

## 2021-09-02 DIAGNOSIS — Z992 Dependence on renal dialysis: Secondary | ICD-10-CM | POA: Diagnosis not present

## 2021-09-02 DIAGNOSIS — N186 End stage renal disease: Secondary | ICD-10-CM | POA: Diagnosis not present

## 2021-09-03 DIAGNOSIS — N186 End stage renal disease: Secondary | ICD-10-CM | POA: Diagnosis not present

## 2021-09-03 DIAGNOSIS — Z992 Dependence on renal dialysis: Secondary | ICD-10-CM | POA: Diagnosis not present

## 2021-09-04 DIAGNOSIS — N186 End stage renal disease: Secondary | ICD-10-CM | POA: Diagnosis not present

## 2021-09-04 DIAGNOSIS — Z992 Dependence on renal dialysis: Secondary | ICD-10-CM | POA: Diagnosis not present

## 2021-09-05 DIAGNOSIS — N186 End stage renal disease: Secondary | ICD-10-CM | POA: Diagnosis not present

## 2021-09-05 DIAGNOSIS — Z992 Dependence on renal dialysis: Secondary | ICD-10-CM | POA: Diagnosis not present

## 2021-09-06 DIAGNOSIS — Z992 Dependence on renal dialysis: Secondary | ICD-10-CM | POA: Diagnosis not present

## 2021-09-06 DIAGNOSIS — N186 End stage renal disease: Secondary | ICD-10-CM | POA: Diagnosis not present

## 2021-09-07 DIAGNOSIS — N186 End stage renal disease: Secondary | ICD-10-CM | POA: Diagnosis not present

## 2021-09-07 DIAGNOSIS — Z992 Dependence on renal dialysis: Secondary | ICD-10-CM | POA: Diagnosis not present

## 2021-09-08 DIAGNOSIS — N186 End stage renal disease: Secondary | ICD-10-CM | POA: Diagnosis not present

## 2021-09-08 DIAGNOSIS — Z992 Dependence on renal dialysis: Secondary | ICD-10-CM | POA: Diagnosis not present

## 2021-09-09 DIAGNOSIS — Z992 Dependence on renal dialysis: Secondary | ICD-10-CM | POA: Diagnosis not present

## 2021-09-09 DIAGNOSIS — N186 End stage renal disease: Secondary | ICD-10-CM | POA: Diagnosis not present

## 2021-09-10 DIAGNOSIS — N186 End stage renal disease: Secondary | ICD-10-CM | POA: Diagnosis not present

## 2021-09-10 DIAGNOSIS — Z992 Dependence on renal dialysis: Secondary | ICD-10-CM | POA: Diagnosis not present

## 2021-09-11 DIAGNOSIS — N186 End stage renal disease: Secondary | ICD-10-CM | POA: Diagnosis not present

## 2021-09-11 DIAGNOSIS — Z992 Dependence on renal dialysis: Secondary | ICD-10-CM | POA: Diagnosis not present

## 2021-09-12 DIAGNOSIS — Z992 Dependence on renal dialysis: Secondary | ICD-10-CM | POA: Diagnosis not present

## 2021-09-12 DIAGNOSIS — N186 End stage renal disease: Secondary | ICD-10-CM | POA: Diagnosis not present

## 2021-09-13 DIAGNOSIS — Z992 Dependence on renal dialysis: Secondary | ICD-10-CM | POA: Diagnosis not present

## 2021-09-13 DIAGNOSIS — N186 End stage renal disease: Secondary | ICD-10-CM | POA: Diagnosis not present

## 2021-09-14 ENCOUNTER — Ambulatory Visit
Admission: RE | Admit: 2021-09-14 | Discharge: 2021-09-14 | Disposition: A | Discharge status: Home / Self Care | Payer: Medicare HMO | Source: Ambulatory Visit | Attending: Urology | Admitting: Urology

## 2021-09-14 DIAGNOSIS — N2889 Other specified disorders of kidney and ureter: Secondary | ICD-10-CM | POA: Diagnosis not present

## 2021-09-14 DIAGNOSIS — K449 Diaphragmatic hernia without obstruction or gangrene: Secondary | ICD-10-CM | POA: Diagnosis not present

## 2021-09-14 DIAGNOSIS — Z992 Dependence on renal dialysis: Secondary | ICD-10-CM | POA: Diagnosis not present

## 2021-09-14 DIAGNOSIS — K8020 Calculus of gallbladder without cholecystitis without obstruction: Secondary | ICD-10-CM | POA: Diagnosis not present

## 2021-09-14 DIAGNOSIS — N186 End stage renal disease: Secondary | ICD-10-CM | POA: Diagnosis not present

## 2021-09-14 DIAGNOSIS — N281 Cyst of kidney, acquired: Secondary | ICD-10-CM | POA: Diagnosis not present

## 2021-09-14 HISTORY — DX: Disorder of kidney and ureter, unspecified: N28.9

## 2021-09-14 MED ORDER — IOHEXOL 300 MG/ML  SOLN
100.0000 mL | Freq: Once | INTRAMUSCULAR | Status: AC | PRN
  Administered 2021-09-14: 100 mL via INTRAVENOUS

## 2021-09-15 DIAGNOSIS — Z992 Dependence on renal dialysis: Secondary | ICD-10-CM | POA: Diagnosis not present

## 2021-09-15 DIAGNOSIS — N186 End stage renal disease: Secondary | ICD-10-CM | POA: Diagnosis not present

## 2021-09-16 DIAGNOSIS — Z992 Dependence on renal dialysis: Secondary | ICD-10-CM | POA: Diagnosis not present

## 2021-09-16 DIAGNOSIS — N186 End stage renal disease: Secondary | ICD-10-CM | POA: Diagnosis not present

## 2021-09-17 DIAGNOSIS — N186 End stage renal disease: Secondary | ICD-10-CM | POA: Diagnosis not present

## 2021-09-17 DIAGNOSIS — Z992 Dependence on renal dialysis: Secondary | ICD-10-CM | POA: Diagnosis not present

## 2021-09-18 DIAGNOSIS — N186 End stage renal disease: Secondary | ICD-10-CM | POA: Diagnosis not present

## 2021-09-18 DIAGNOSIS — Z992 Dependence on renal dialysis: Secondary | ICD-10-CM | POA: Diagnosis not present

## 2021-09-19 DIAGNOSIS — Z992 Dependence on renal dialysis: Secondary | ICD-10-CM | POA: Diagnosis not present

## 2021-09-19 DIAGNOSIS — N186 End stage renal disease: Secondary | ICD-10-CM | POA: Diagnosis not present

## 2021-09-20 DIAGNOSIS — N186 End stage renal disease: Secondary | ICD-10-CM | POA: Diagnosis not present

## 2021-09-20 DIAGNOSIS — Z992 Dependence on renal dialysis: Secondary | ICD-10-CM | POA: Diagnosis not present

## 2021-09-21 DIAGNOSIS — N186 End stage renal disease: Secondary | ICD-10-CM | POA: Diagnosis not present

## 2021-09-21 DIAGNOSIS — Z992 Dependence on renal dialysis: Secondary | ICD-10-CM | POA: Diagnosis not present

## 2021-09-22 DIAGNOSIS — Z992 Dependence on renal dialysis: Secondary | ICD-10-CM | POA: Diagnosis not present

## 2021-09-22 DIAGNOSIS — N186 End stage renal disease: Secondary | ICD-10-CM | POA: Diagnosis not present

## 2021-09-23 ENCOUNTER — Ambulatory Visit: Payer: Medicare HMO | Admitting: Urology

## 2021-09-23 DIAGNOSIS — N186 End stage renal disease: Secondary | ICD-10-CM | POA: Diagnosis not present

## 2021-09-23 DIAGNOSIS — Z992 Dependence on renal dialysis: Secondary | ICD-10-CM | POA: Diagnosis not present

## 2021-09-24 ENCOUNTER — Ambulatory Visit: Payer: Medicare HMO | Admitting: Urology

## 2021-09-24 ENCOUNTER — Encounter: Payer: Self-pay | Admitting: Urology

## 2021-09-24 VITALS — BP 146/83 | HR 90 | Ht 59.0 in | Wt 195.0 lb

## 2021-09-24 DIAGNOSIS — Z992 Dependence on renal dialysis: Secondary | ICD-10-CM

## 2021-09-24 DIAGNOSIS — N186 End stage renal disease: Secondary | ICD-10-CM | POA: Diagnosis not present

## 2021-09-24 DIAGNOSIS — N2889 Other specified disorders of kidney and ureter: Secondary | ICD-10-CM

## 2021-09-24 NOTE — Patient Instructions (Signed)

## 2021-09-24 NOTE — Progress Notes (Signed)
   09/24/2021 5:05 PM   Laura Meyer May 11, 1940 [id number]  Reason for visit: Follow up right renal mass, ESRD on dialysis  HPI: 81 year old female on peritoneal dialysis for ESRD who I saw last in February 2023 for a 3.5 cm renal mass.  She opted for active surveillance in the setting of her comorbidities.  I personally viewed and interpreted the most recent CT dated 09/14/2021 that shows enlargement of the exophytic right renal mass by about 1 cm concerning for RCC.  This was measured by radiology at 3.8 cm on the axial films, on my review of the coronal films this appears closer to 4.5 cm.  There are also some small ~1cm left renal lesions that do show some enhancement.  I had a very long conversation today with the patient and her daughter about her right-sided renal mass with enlargement over the last year concerning for RCC.  We reviewed options at length including active surveillance, renal mass biopsy, percutaneous ablation, or radical nephrectomy.  With her ESRD on dialysis partial nephrectomy would not be required.  They are very opposed to surgery at this time, but willing to consider that if there are no other options.  They are interested in meeting with interventional radiology to see if this would be amenable to biopsy and percutaneous ablation.  I discussed with them at length that typically percutaneous ablation is better suited for masses smaller than 3 to 4 cm, and she may not be a candidate for ablation.  If unable to perform percutaneous ablation, we will need to consider if laparoscopic radical right nephrectomy could be safely performed here at [facility] or if she would warrant referral to [facility] with her morbid obesity and peritoneal dialysis.  Referral placed IR to consider renal mass biopsy and potentially ablation RTC 6 weeks to discuss treatment plan  I spent 45 total minutes on the day of the encounter including pre-visit review of the medical record,  face-to-face time with the patient, and post visit ordering of labs/imaging/tests.   Billey Co, [facility] [address] [place],  [address] [phone]

## 2021-09-25 DIAGNOSIS — Z992 Dependence on renal dialysis: Secondary | ICD-10-CM | POA: Diagnosis not present

## 2021-09-25 DIAGNOSIS — N186 End stage renal disease: Secondary | ICD-10-CM | POA: Diagnosis not present

## 2021-09-26 DIAGNOSIS — N186 End stage renal disease: Secondary | ICD-10-CM | POA: Diagnosis not present

## 2021-09-26 DIAGNOSIS — Z992 Dependence on renal dialysis: Secondary | ICD-10-CM | POA: Diagnosis not present

## 2021-09-27 DIAGNOSIS — N186 End stage renal disease: Secondary | ICD-10-CM | POA: Diagnosis not present

## 2021-09-27 DIAGNOSIS — Z992 Dependence on renal dialysis: Secondary | ICD-10-CM | POA: Diagnosis not present

## 2021-09-28 ENCOUNTER — Other Ambulatory Visit: Payer: Self-pay | Admitting: Nurse Practitioner

## 2021-09-28 DIAGNOSIS — Z992 Dependence on renal dialysis: Secondary | ICD-10-CM | POA: Diagnosis not present

## 2021-09-28 DIAGNOSIS — C220 Liver cell carcinoma: Secondary | ICD-10-CM

## 2021-09-28 DIAGNOSIS — N186 End stage renal disease: Secondary | ICD-10-CM | POA: Diagnosis not present

## 2021-09-29 DIAGNOSIS — Z992 Dependence on renal dialysis: Secondary | ICD-10-CM | POA: Diagnosis not present

## 2021-09-29 DIAGNOSIS — N186 End stage renal disease: Secondary | ICD-10-CM | POA: Diagnosis not present

## 2021-09-30 DIAGNOSIS — N186 End stage renal disease: Secondary | ICD-10-CM | POA: Diagnosis not present

## 2021-09-30 DIAGNOSIS — Z992 Dependence on renal dialysis: Secondary | ICD-10-CM | POA: Diagnosis not present

## 2021-10-01 DIAGNOSIS — N186 End stage renal disease: Secondary | ICD-10-CM | POA: Diagnosis not present

## 2021-10-01 DIAGNOSIS — Z992 Dependence on renal dialysis: Secondary | ICD-10-CM | POA: Diagnosis not present

## 2021-10-02 DIAGNOSIS — Z992 Dependence on renal dialysis: Secondary | ICD-10-CM | POA: Diagnosis not present

## 2021-10-02 DIAGNOSIS — N186 End stage renal disease: Secondary | ICD-10-CM | POA: Diagnosis not present

## 2021-10-03 DIAGNOSIS — Z992 Dependence on renal dialysis: Secondary | ICD-10-CM | POA: Diagnosis not present

## 2021-10-03 DIAGNOSIS — N186 End stage renal disease: Secondary | ICD-10-CM | POA: Diagnosis not present

## 2021-10-04 DIAGNOSIS — N186 End stage renal disease: Secondary | ICD-10-CM | POA: Diagnosis not present

## 2021-10-04 DIAGNOSIS — Z992 Dependence on renal dialysis: Secondary | ICD-10-CM | POA: Diagnosis not present

## 2021-10-05 DIAGNOSIS — Z992 Dependence on renal dialysis: Secondary | ICD-10-CM | POA: Diagnosis not present

## 2021-10-05 DIAGNOSIS — N186 End stage renal disease: Secondary | ICD-10-CM | POA: Diagnosis not present

## 2021-10-06 ENCOUNTER — Other Ambulatory Visit: Payer: Medicare HMO

## 2021-10-06 ENCOUNTER — Ambulatory Visit
Admission: RE | Admit: 2021-10-06 | Discharge: 2021-10-06 | Disposition: A | Discharge status: Home / Self Care | Payer: Medicare HMO | Source: Ambulatory Visit | Attending: Nurse Practitioner | Admitting: Nurse Practitioner

## 2021-10-06 DIAGNOSIS — N186 End stage renal disease: Secondary | ICD-10-CM | POA: Diagnosis not present

## 2021-10-06 DIAGNOSIS — C220 Liver cell carcinoma: Secondary | ICD-10-CM

## 2021-10-06 DIAGNOSIS — N2889 Other specified disorders of kidney and ureter: Secondary | ICD-10-CM | POA: Diagnosis not present

## 2021-10-06 DIAGNOSIS — Z992 Dependence on renal dialysis: Secondary | ICD-10-CM | POA: Diagnosis not present

## 2021-10-06 HISTORY — PX: IR RADIOLOGIST EVAL & MGMT: IMG5224

## 2021-10-06 NOTE — Consult Note (Signed)
Chief Complaint: Patient was seen in consultation today for RENAL MASS at the request of Nickolas Madrid, MD  Referring Physician(s): Herbert Seta. Diamantina Providence, MD  History of Present Illness: Laura Meyer is a 81 y.o. 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  While undergoing work-up for her underlying renal dysfunction, a 3.5 cm lesion was identified in the lower pole of the right kidney.  Follow-up MRIs were performed without contrast in November 2022 and January 2023 demonstrating slight interval growth of the complex cystic lesion.  Subsequently, she underwent further evaluation with contrast-enhanced CT scan of the abdomen on 09/14/2021 which demonstrates an avidly enhancing an enlarging mass exophytic from the lower pole of the right kidney consistent with an enlarging renal cell carcinoma.  The mass measures 3.8 x 3.8 x 4.2 cm.  Laura Meyer presents to clinic today accompanied by her daughter.  She is essentially asymptomatic.  She denies flank pain, hematuria, unintentional weight loss, nausea, vomiting or other related clinical symptoms.  She does have intermittent low back pain which she has had for many years.  Past Medical History:  Diagnosis Date   Anemia    Arthritis    hands   CKD (chronic kidney disease), stage IV (HCC)    Hypertension    Pre-diabetes    Renal insufficiency    Vertigo 2019   2 episodes.  none in over 1 yr.    Past Surgical History:  Procedure Laterality Date   CAPD INSERTION N/A 03/12/2021   Procedure: LAPAROSCOPIC INSERTION CONTINUOUS AMBULATORY PERITONEAL DIALYSIS  (CAPD) CATHETER;  Surgeon: Olean Ree, MD;  Location: [facility];  Service: General;  Laterality: N/A;  Provider requesting 2 hours / 120 minutes for procedure.   CATARACT EXTRACTION W/PHACO Left 06/28/2018   Procedure: CATARACT EXTRACTION PHACO AND INTRAOCULAR LENS PLACEMENT ([place])  LEFT;  Surgeon: Leandrew Koyanagi, MD;  Location: [place];  Service: Ophthalmology;  Laterality: Left;   CATARACT EXTRACTION W/PHACO Right 07/19/2018   Procedure: CATARACT EXTRACTION PHACO AND INTRAOCULAR LENS PLACEMENT ([place]) RIGHT;  Surgeon: Leandrew Koyanagi, MD;  Location: [place];  Service: Ophthalmology;  Laterality: Right;   DILATION AND CURETTAGE OF UTERUS     IR RADIOLOGIST EVAL & MGMT  10/06/2021   PARATHYROIDECTOMY     REPLACEMENT TOTAL KNEE Bilateral 07/03/2003   left - 07/03/03, right - 07/26/12    Allergies: Contrast media [iodinated contrast media] and Iodine  Medications: Prior to Admission medications   Medication Sig Start Date End Date Taking? Authorizing Provider  acetaminophen (TYLENOL) 500 MG tablet Take 1,000 mg by mouth every 6 (six) hours as needed for moderate pain or mild pain.    [provider]  allopurinol (ZYLOPRIM) 100 MG tablet Take 100 mg by mouth daily.    [provider]  aspirin 81 MG tablet Take 81 mg by mouth daily.    [provider]  carvedilol (COREG) 12.5 MG tablet Take 12.5 mg by mouth 2 (two) times daily with a meal.    [provider]  Cholecalciferol (VITAMIN D3) 50 MCG (2000 UT) TABS Take 2,000 Units by mouth daily.    [provider]  hydrochlorothiazide (HYDRODIURIL) 12.5 MG tablet Take 12.5 mg by mouth daily as needed (Fluid).    [provider]  losartan (COZAAR) 100 MG tablet Take 100 mg by mouth daily. 02/17/21   [provider]  Polyethyl Glycol-Propyl Glycol (SYSTANE) 0.4-0.3 % SOLN Place 1 drop into both eyes daily.  [provider]  simvastatin (ZOCOR) 20 MG tablet Take 20 mg by mouth at bedtime.    [provider]  sodium chloride (MURO 128) 5 % ophthalmic ointment Place 1 application into both eyes at bedtime.    [provider]     No family history on file.  Social History   Socioeconomic History   Marital status: Widowed    Spouse name: Not on file   Number of children: Not  on file   Years of education: Not on file   Highest education level: Not on file  Occupational History   Not on file  Tobacco Use   Smoking status: Never    Passive exposure: Never   Smokeless tobacco: Never  Vaping Use   Vaping Use: Never used  Substance and Sexual Activity   Alcohol use: Not Currently   Drug use: Never   Sexual activity: Not Currently  Other Topics Concern   Not on file  Social History Narrative   Not on file   Social Determinants of Health   Financial Resource Strain: Not on file  Food Insecurity: Not on file  Transportation Needs: Not on file  Physical Activity: Not on file  Stress: Not on file  Social Connections: Not on file    ECOG Status: 0 - Asymptomatic  Review of Systems: A 12 point ROS discussed and pertinent positives are indicated in the HPI above.  All other systems are negative.  Review of Systems  Vital Signs: There were no vitals taken for this visit.    Physical Exam Constitutional:      General: She is not in acute distress.    Appearance: Normal appearance. She is obese.  HENT:     Head: Normocephalic and atraumatic.  Eyes:     General: No scleral icterus. Cardiovascular:     Rate and Rhythm: Normal rate.  Pulmonary:     Effort: Pulmonary effort is normal.  Abdominal:     General: There is no distension.     Palpations: Abdomen is soft.     Tenderness: There is no abdominal tenderness.  Skin:    General: Skin is warm and dry.  Neurological:     Mental Status: She is alert and oriented to person, place, and time.  Psychiatric:        Mood and Affect: Mood normal.        Behavior: Behavior normal.       Imaging: IR Radiologist Eval & Mgmt  Result Date: 10/06/2021 EXAM: NEW PATIENT OFFICE VISIT CHIEF COMPLAINT: SEE EPIC NOTE HISTORY OF PRESENT ILLNESS: SEE EPIC NOTE REVIEW OF SYSTEMS: SEE EPIC NOTE PHYSICAL EXAMINATION: SEE EPIC NOTE ASSESSMENT AND PLAN: SEE EPIC NOTE Electronically Signed   By: Jacqulynn Cadet M.D.   On: 10/06/2021 15:26   CT RENAL ABD W/WO  Result Date: 09/14/2021 CLINICAL DATA:  Follow-up bilateral indeterminate renal lesions * Tracking Code: BO * EXAM: CT ABDOMEN WITHOUT AND WITH CONTRAST TECHNIQUE: Multidetector CT imaging of the abdomen was performed following the standard protocol before and following the bolus administration of intravenous contrast. RADIATION DOSE REDUCTION: This exam was performed according to the departmental dose-optimization program which includes automated exposure control, adjustment of the mA and/or kV according to patient size and/or use of iterative reconstruction technique. CONTRAST:  153m OMNIPAQUE IOHEXOL 300 MG/ML  SOLN COMPARISON:  MR abdomen, 02/26/2021 FINDINGS: Lower chest: Unchanged small pericardial effusion. Unchanged small right pleural effusion. Moderate hiatal hernia containing fat, fluid, and a  small portion of the gastric fundus. Hepatobiliary: No solid liver abnormality is seen. Rim calcified gallstone in the gallbladder fundus. Gallbladder wall thickening, or biliary dilatation. Pancreas: Similar appearance of multiple fluid attenuation cystic lesions throughout the pancreas, largest lesion in the central pancreatic head measuring proximally 2.2 x 1.3 cm (series 15, image 54). No pancreatic ductal dilatation or surrounding inflammatory changes. Spleen: Normal in size without significant abnormality. Adrenals/Urinary Tract: Adrenal glands are unremarkable. Vividly contrast enhancing, exophytic mass of the posterior inferior pole of the right kidney measuring 3.8 x 3.8 cm (series 8, image 89). This is slightly enlarged compared to prior examination at which time it measured 3.5 x 3.3 cm. Additional small adjacent arterially enhancing lesions of the superior pole of the left kidney, measuring 1.1 x 1.1 cm (series 8, image 50) and just anteriorly measuring 1.1 x 0.5 cm, this second lesion possibly an eccentric nodular component of an adjacent  cyst (series 8, image 50). Numerous additional renal cysts of varying intrinsic attenuation, otherwise without evidence of contrast enhancement and consistent with simple and hemorrhagic or proteinaceous cysts. No calculi or hydronephrosis. Stomach/Bowel: Stomach is within normal limits. No evidence of bowel wall thickening, distention, or inflammatory changes. Vascular/Lymphatic: Aortic atherosclerosis. No evidence of renal vein invasion. No enlarged abdominal lymph nodes. Other: No abdominal wall hernia or abnormality. Partially imaged tunneled catheter in the subcutaneous tissues of the left hemiabdomen. Small volume ascites, presumably peritoneal dialysate. Musculoskeletal: No acute or significant osseous findings. IMPRESSION: 1. Contrast enhancing exophytic mass of the posterior inferior pole of the right kidney measuring 3.8 x 3.8 cm. This is slightly enlarged compared to prior examination at which time it measured 3.5 x 3.3 cm. This is consistent with an enlarging renal cell carcinoma. 2. Additional small adjacent arterially enhancing lesions of the superior pole of the left kidney, measuring 1.1 x 1.1 cm and just anteriorly measuring 1.1 x 0.5 cm, this second lesion possibly an eccentric nodular mural component of an adjacent cyst. Findings are concerning for additional small renal cell carcinomas. 3. No evidence of renal vein invasion, lymphadenopathy, or metastatic disease in the abdomen. 4. Numerous additional benign simple and hemorrhagic/proteinaceous renal cortical cysts. No specific further follow-up or characterization required for these cysts. 5. Similar appearance of multiple fluid attenuation cystic lesions throughout the pancreas, largest lesion in the central pancreatic head measuring approximately 2.2 x 1.3 cm. These are better characterized by prior MR, and follow-up contrast enhanced MRI is recommended in 6 months to assess for stability. 6. Cholelithiasis. 7. Small volume ascites, presumably  peritoneal dialysate. 8. Moderate hiatal hernia containing fat, fluid, and a small portion of the gastric fundus. These results will be called to the ordering clinician or representative by the Radiologist Assistant, and communication documented in the PACS or [employer]. Electronically Signed   By: Delanna Ahmadi M.D.   On: 09/14/2021 14:42    Labs:  CBC: Recent Labs    03/09/21 1334 03/12/21 1248  WBC 7.6  --   HGB 9.1* 9.2*  HCT 28.5* 27.0*  PLT 221  --     COAGS: Recent Labs    03/09/21 1334  INR 1.1  APTT 29    BMP: Recent Labs    03/12/21 1248  NA 137  K 4.6  CL 108  GLUCOSE 105*  BUN 125*  CREATININE 4.20*    LIVER FUNCTION TESTS: No results for input(s): "BILITOT", "AST", "ALT", "ALKPHOS", "PROT", "ALBUMIN" in the last 8760 hours.  TUMOR MARKERS: No results for  input(s): "AFPTM", "CEA", "CA199", "CHROMGRNA" in the last 8760 hours.  Assessment and Plan:  Very pleasant 81 year old female with end-stage renal disease on peritoneal dialysis who has an enlarging enhancing mass exophytic from the lower pole of the right kidney.  The imaging findings are highly concerning for an enlarging renal cell carcinoma.  The mass presently measures 3.8 x 3.8 x 4.2 cm in maximal dimensions.  The lesion has a very favorable location exophytic from the posterolateral lower pole.  No critical structures are nearby.  This allows for an aggressive approach to ablation which is quite helpful given that the lesion is at the upper limits of normal for size in terms of what can be successfully ablated.  I believe we can obtain successful treatment of this lesion with minimal risk of bleeding or damage to surrounding structures.  Surgical nephrectomy is also an option, however she has a suboptimal candidate for such a surgical intervention given her relatively advanced age, body habitus and background renal dysfunction.  She is quite motivated to pursue a minimally invasive approach  including renal biopsy and concurrent renal ablation.  Given the favorable location of the lesion and its relative hypervascularity I believe she would be an excellent candidate for microwave ablation which would have a significantly lesser chance of bleeding complication versus cryoablation.  1.)  Schedule for concurrent CT-guided biopsy and microwave ablation to be performed at [facility] by me as soon as possible.  Thank you for this interesting consult.  I greatly enjoyed meeting Laura Meyer and look forward to participating in their care.  A copy of this report was sent to the requesting provider on this date.  Electronically Signed: Criselda Peaches 10/06/2021, 3:30 PM   I spent a total of  40 Minutes in face to face in clinical consultation, greater than 50% of which was counseling/coordinating care for right renal neoplasm.

## 2021-10-07 DIAGNOSIS — Z992 Dependence on renal dialysis: Secondary | ICD-10-CM | POA: Diagnosis not present

## 2021-10-07 DIAGNOSIS — N186 End stage renal disease: Secondary | ICD-10-CM | POA: Diagnosis not present

## 2021-10-08 ENCOUNTER — Other Ambulatory Visit (HOSPITAL_COMMUNITY): Payer: Self-pay | Admitting: Interventional Radiology

## 2021-10-08 DIAGNOSIS — N186 End stage renal disease: Secondary | ICD-10-CM | POA: Diagnosis not present

## 2021-10-08 DIAGNOSIS — Z992 Dependence on renal dialysis: Secondary | ICD-10-CM | POA: Diagnosis not present

## 2021-10-08 DIAGNOSIS — N2889 Other specified disorders of kidney and ureter: Secondary | ICD-10-CM

## 2021-10-09 DIAGNOSIS — N186 End stage renal disease: Secondary | ICD-10-CM | POA: Diagnosis not present

## 2021-10-09 DIAGNOSIS — Z992 Dependence on renal dialysis: Secondary | ICD-10-CM | POA: Diagnosis not present

## 2021-10-10 DIAGNOSIS — N186 End stage renal disease: Secondary | ICD-10-CM | POA: Diagnosis not present

## 2021-10-10 DIAGNOSIS — Z992 Dependence on renal dialysis: Secondary | ICD-10-CM | POA: Diagnosis not present

## 2021-10-11 DIAGNOSIS — N186 End stage renal disease: Secondary | ICD-10-CM | POA: Diagnosis not present

## 2021-10-11 DIAGNOSIS — Z992 Dependence on renal dialysis: Secondary | ICD-10-CM | POA: Diagnosis not present

## 2021-10-12 DIAGNOSIS — N186 End stage renal disease: Secondary | ICD-10-CM | POA: Diagnosis not present

## 2021-10-12 DIAGNOSIS — Z992 Dependence on renal dialysis: Secondary | ICD-10-CM | POA: Diagnosis not present

## 2021-10-13 DIAGNOSIS — N186 End stage renal disease: Secondary | ICD-10-CM | POA: Diagnosis not present

## 2021-10-13 DIAGNOSIS — Z992 Dependence on renal dialysis: Secondary | ICD-10-CM | POA: Diagnosis not present

## 2021-10-14 DIAGNOSIS — Z992 Dependence on renal dialysis: Secondary | ICD-10-CM | POA: Diagnosis not present

## 2021-10-14 DIAGNOSIS — N186 End stage renal disease: Secondary | ICD-10-CM | POA: Diagnosis not present

## 2021-10-15 DIAGNOSIS — Z992 Dependence on renal dialysis: Secondary | ICD-10-CM | POA: Diagnosis not present

## 2021-10-15 DIAGNOSIS — N186 End stage renal disease: Secondary | ICD-10-CM | POA: Diagnosis not present

## 2021-10-15 NOTE — Progress Notes (Signed)
Called IR PA-C line to request orders for preop  No answer- left LVM.

## 2021-10-16 ENCOUNTER — Other Ambulatory Visit: Payer: Self-pay | Admitting: Radiology

## 2021-10-16 DIAGNOSIS — N186 End stage renal disease: Secondary | ICD-10-CM | POA: Diagnosis not present

## 2021-10-16 DIAGNOSIS — Z992 Dependence on renal dialysis: Secondary | ICD-10-CM | POA: Diagnosis not present

## 2021-10-16 DIAGNOSIS — N2889 Other specified disorders of kidney and ureter: Secondary | ICD-10-CM

## 2021-10-17 DIAGNOSIS — Z992 Dependence on renal dialysis: Secondary | ICD-10-CM | POA: Diagnosis not present

## 2021-10-17 DIAGNOSIS — N186 End stage renal disease: Secondary | ICD-10-CM | POA: Diagnosis not present

## 2021-10-18 DIAGNOSIS — Z992 Dependence on renal dialysis: Secondary | ICD-10-CM | POA: Diagnosis not present

## 2021-10-18 DIAGNOSIS — N186 End stage renal disease: Secondary | ICD-10-CM | POA: Diagnosis not present

## 2021-10-19 DIAGNOSIS — N186 End stage renal disease: Secondary | ICD-10-CM | POA: Diagnosis not present

## 2021-10-19 DIAGNOSIS — Z992 Dependence on renal dialysis: Secondary | ICD-10-CM | POA: Diagnosis not present

## 2021-10-20 DIAGNOSIS — N186 End stage renal disease: Secondary | ICD-10-CM | POA: Diagnosis not present

## 2021-10-20 DIAGNOSIS — Z992 Dependence on renal dialysis: Secondary | ICD-10-CM | POA: Diagnosis not present

## 2021-10-21 DIAGNOSIS — Z992 Dependence on renal dialysis: Secondary | ICD-10-CM | POA: Diagnosis not present

## 2021-10-21 DIAGNOSIS — N186 End stage renal disease: Secondary | ICD-10-CM | POA: Diagnosis not present

## 2021-10-21 NOTE — Patient Instructions (Signed)
SURGICAL WAITING ROOM VISITATION Patients having surgery or a procedure may have no more than 2 support people in the waiting area - these visitors may rotate in the visitor waiting room.   Children under the age of 80 must have an adult with them who is not the patient. If the patient needs to stay at the hospital during part of their recovery, the visitor guidelines for inpatient rooms apply.  PRE-OP VISITATION  Pre-op nurse will coordinate an appropriate time for 1 support person to accompany the patient in pre-op.  This support person may not rotate.  This visitor will be contacted when the time is appropriate for the visitor to come back in the pre-op area.  Please refer to the [facility] website for the visitor guidelines for Inpatients (after your surgery is over and you are in a regular room).  You are not required to quarantine at this time prior to your surgery. However, you must do this: Hand Hygiene often Do NOT share personal items Notify your provider if you are in close contact with someone who has COVID or you develop fever 100.4 or greater, new onset of sneezing, cough, sore throat, shortness of breath or body aches.   If you received a COVID test during your pre-op visit  it is requested that you wear a mask when out in public, stay away from anyone that may not be feeling well and notify your surgeon if you develop symptoms. If you test positive for Covid or have been in contact with anyone that has tested positive in the last 10 days please notify you surgeon.       Your procedure is scheduled on:  Wednesday  November 04, 2021  Report to [facility] Main Entrance.  Report to admitting at:  07:00   AM  +++++Call this number if you have any questions or problems the morning of surgery [phone]  Do not eat food or drink anything after Midnight the night prior to your surgery/procedure.           FOLLOW BOWEL PREP AND ANY ADDITIONAL PRE OP INSTRUCTIONS YOU  RECEIVED FROM YOUR SURGEON'S OFFICE!!!   Oral Hygiene is also important to reduce your risk of infection.        Remember - BRUSH YOUR TEETH THE MORNING OF SURGERY WITH YOUR REGULAR TOOTHPASTE  Do NOT smoke after Midnight the night before surgery.  Take ONLY these medicines the morning of surgery with A SIP OF WATER: Carvedilol (Coreg), Allopurinol, Systane Eye drops and Tylenol if needed.    Bring CPAP mask and tubing day of surgery.                   You may not have any metal on your body including hair pins, jewelry, and body piercing  Do not wear make-up, lotions, powders, perfumes, or deodorant  Do not wear nail polish including gel and S&S, artificial / acrylic nails, or any other type of covering on natural nails including finger and toenails. If you have artificial nails, gel coating, etc., that needs to be removed by a nail salon, Please have this removed prior to surgery. Not doing so may mean that your surgery could be cancelled or delayed if the Surgeon or anesthesia staff feels like they are unable to monitor you safely.   Do not shave 48 hours prior to surgery to avoid nicks in your skin which may contribute to postoperative infections.   Contacts, Hearing Aids, dentures or  bridgework may not be worn into surgery.   DO NOT [place]. PHARMACY WILL DISPENSE MEDICATIONS LISTED ON YOUR MEDICATION LIST TO YOU DURING YOUR ADMISSION [place]!   Patients discharged on the day of surgery will not be allowed to drive home.  Someone NEEDS to stay with you for the first 24 hours after anesthesia.  Please read over the following fact sheets you were given: IF YOU HAVE QUESTIONS ABOUT YOUR PRE-OP ANESTHESIA INSTRUCTIONS, PLEASE CALL [phone]  ([place])    - Preparing for Surgery Before surgery, you can play an important role.  Because skin is not sterile, your skin needs to be as free of germs as possible.  You can reduce the number  of germs on your skin by washing with CHG (chlorahexidine gluconate) soap before surgery.  CHG is an antiseptic cleaner which kills germs and bonds with the skin to continue killing germs even after washing. Please DO NOT use if you have an allergy to CHG or antibacterial soaps.  If your skin becomes reddened/irritated stop using the CHG and inform your nurse when you arrive at Short Stay. Do not shave (including legs and underarms) for at least 48 hours prior to the first CHG shower.  You may shave your face/neck.  Please follow these instructions carefully:  1.  Shower with CHG Soap the night before surgery and the  morning of surgery.  2.  If you choose to wash your hair, wash your hair first as usual with your normal  shampoo.  3.  After you shampoo, rinse your hair and body thoroughly to remove the shampoo.                             4.  Use CHG as you would any other liquid soap.  You can apply chg directly to the skin and wash.  Gently with a scrungie or clean washcloth.  5.  Apply the CHG Soap to your body ONLY FROM THE NECK DOWN.   Do not use on face/ open                           Wound or open sores. Avoid contact with eyes, ears mouth and genitals (private parts).                       Wash face,  Genitals (private parts) with your normal soap.             6.  Wash thoroughly, paying special attention to the area where your  surgery  will be performed.  7.  Thoroughly rinse your body with warm water from the neck down.  8.  DO NOT shower/wash with your normal soap after using and rinsing off the CHG Soap.            9.  Pat yourself dry with a clean towel.            10.  Wear clean pajamas.            11.  Place clean sheets on your bed the night of your first shower and do not  sleep with pets.  ON THE DAY OF SURGERY : Do not apply any lotions/deodorants the morning of surgery.  Please wear clean clothes to the hospital/surgery center.    FAILURE TO FOLLOW THESE INSTRUCTIONS  MAY RESULT IN THE CANCELLATION OF YOUR SURGERY  PATIENT SIGNATURE_________________________________  NURSE SIGNATURE__________________________________  ________________________________________________________________________

## 2021-10-21 NOTE — Progress Notes (Signed)
COVID Vaccine received:  '[]'$  No '[x]'$  Yes Date of any COVID positive Test in last 28 days:None  PCP - Governor Specking, MD [place]   [place], [place] [address]   [phone] (Work)   [phone] (Fax)    Cardiologist - Isaias Cowman, MD (LOV 11-11-2014 for pre-op clearance) [phone] (Work) [phone] (Fax) [address] [facility] [place], [place] [address]  Nephrology- Dr. Murlean Iba  [facility]   [place], [place] [address]   [phone] (Work)   [phone] (Fax)       (Patient is ESRD on Peritoneal Dialysis w/ oversight from [facility]), [facility] External Provider - Clinic Address: [place], [place] [address]-    [phone]  Chest x-ray - 10-17-2014  Epic EKG - 03-13-2021  Epic  Stress Test - n/a ECHO - n/a Cardiac Cath - n/a  Pacemaker/ICD device     '[x]'$  N/A Spinal Cord Stimulator:'[x]'$  No '[]'$  Yes      (Remind patient to bring remote DOS) Other Implants: Peritoneal dialysis catheter  Bowel Prep - NPO after midnight   History of Sleep Apnea? '[x]'$  No '[]'$  Yes   Sleep Study Date:   CPAP used?- '[x]'$  No '[]'$  Yes  (Instruct to bring their mask & Tubing)  Does the patient monitor blood sugar? '[x]'$  No '[]'$  Yes  '[]'$  N/A Does patient have a [employer] or Dexacom? '[x]'$  No '[]'$  Yes   Checks Blood Sugar ___0__ times a day  Blood Thinner Instructions: none Aspirin Instructions: ASA '81mg'$    hold x 3 days  ERAS Protocol Ordered: '[x]'$  No  '[]'$  Yes PRE-SURGERY '[]'$  ENSURE  '[]'$  G2   Comments:  patient is on peritoneal dialysis and some contrast dyes prohibited due to kidney disease.   Activity level: Patient can not climb a flight of stairs without difficulty; '[x]'$  No CP   but would have __SOB__   Anesthesia review: ESRD on PD, HTN/ Pre-DM   Patient denies shortness of breath, fever, cough and chest pain at PAT appointment.  Patient verbalized understanding and  agreement to the Pre-Surgical Instructions that were given to them at this PAT appointment. Patient was also educated of the need to review these PAT instructions again prior to his/her surgery.I reviewed the appropriate phone numbers to call if they have any and questions or concerns.

## 2021-10-22 ENCOUNTER — Encounter (HOSPITAL_COMMUNITY)
Admission: RE | Admit: 2021-10-22 | Discharge: 2021-10-22 | Disposition: A | Discharge status: Home / Self Care | Payer: Medicare HMO | Source: Ambulatory Visit | Attending: Interventional Radiology | Admitting: Interventional Radiology

## 2021-10-22 ENCOUNTER — Encounter (HOSPITAL_COMMUNITY): Payer: Self-pay

## 2021-10-22 ENCOUNTER — Ambulatory Visit (HOSPITAL_COMMUNITY)
Admission: RE | Admit: 2021-10-22 | Discharge: 2021-10-22 | Disposition: A | Discharge status: Home / Self Care | Payer: Medicare HMO | Source: Ambulatory Visit | Attending: Radiology | Admitting: Radiology

## 2021-10-22 ENCOUNTER — Other Ambulatory Visit: Payer: Self-pay

## 2021-10-22 VITALS — BP 162/84 | HR 78 | Temp 99.0°F | Resp 16 | Ht 59.0 in | Wt 195.0 lb

## 2021-10-22 DIAGNOSIS — N2889 Other specified disorders of kidney and ureter: Secondary | ICD-10-CM | POA: Insufficient documentation

## 2021-10-22 DIAGNOSIS — J90 Pleural effusion, not elsewhere classified: Secondary | ICD-10-CM | POA: Diagnosis not present

## 2021-10-22 DIAGNOSIS — N186 End stage renal disease: Secondary | ICD-10-CM | POA: Diagnosis not present

## 2021-10-22 DIAGNOSIS — Z992 Dependence on renal dialysis: Secondary | ICD-10-CM | POA: Diagnosis not present

## 2021-10-22 DIAGNOSIS — Z01812 Encounter for preprocedural laboratory examination: Secondary | ICD-10-CM | POA: Diagnosis not present

## 2021-10-22 DIAGNOSIS — R7303 Prediabetes: Secondary | ICD-10-CM | POA: Diagnosis not present

## 2021-10-22 LAB — CBC WITH DIFFERENTIAL/PLATELET
Abs Immature Granulocytes: 0.03 10*3/uL (ref 0.00–0.07)
Basophils Absolute: 0 10*3/uL (ref 0.0–0.1)
Basophils Relative: 0 %
Eosinophils Absolute: 0.4 10*3/uL (ref 0.0–0.5)
Eosinophils Relative: 5 %
HCT: 31.4 % — ABNORMAL LOW (ref 36.0–46.0)
Hemoglobin: 10.3 g/dL — ABNORMAL LOW (ref 12.0–15.0)
Immature Granulocytes: 0 %
Lymphocytes Relative: 14 %
Lymphs Abs: 1.1 10*3/uL (ref 0.7–4.0)
MCH: 33.3 pg (ref 26.0–34.0)
MCHC: 32.8 g/dL (ref 30.0–36.0)
MCV: 101.6 fL — ABNORMAL HIGH (ref 80.0–100.0)
Monocytes Absolute: 0.5 10*3/uL (ref 0.1–1.0)
Monocytes Relative: 6 %
Neutro Abs: 5.6 10*3/uL (ref 1.7–7.7)
Neutrophils Relative %: 75 %
Platelets: 202 10*3/uL (ref 150–400)
RBC: 3.09 MIL/uL — ABNORMAL LOW (ref 3.87–5.11)
RDW: 13.2 % (ref 11.5–15.5)
WBC: 7.6 10*3/uL (ref 4.0–10.5)
nRBC: 0 % (ref 0.0–0.2)

## 2021-10-22 LAB — TYPE AND SCREEN
ABO/RH(D): A POS
Antibody Screen: NEGATIVE

## 2021-10-22 LAB — PROTIME-INR
INR: 1.1 (ref 0.8–1.2)
Prothrombin Time: 13.7 seconds (ref 11.4–15.2)

## 2021-10-22 LAB — HEMOGLOBIN A1C
Hgb A1c MFr Bld: 5.8 % — ABNORMAL HIGH (ref 4.8–5.6)
Mean Plasma Glucose: 119.76 mg/dL

## 2021-10-22 LAB — GLUCOSE, CAPILLARY: Glucose-Capillary: 105 mg/dL — ABNORMAL HIGH (ref 70–99)

## 2021-10-23 DIAGNOSIS — N186 End stage renal disease: Secondary | ICD-10-CM | POA: Diagnosis not present

## 2021-10-23 DIAGNOSIS — Z992 Dependence on renal dialysis: Secondary | ICD-10-CM | POA: Diagnosis not present

## 2021-10-24 DIAGNOSIS — N186 End stage renal disease: Secondary | ICD-10-CM | POA: Diagnosis not present

## 2021-10-24 DIAGNOSIS — Z992 Dependence on renal dialysis: Secondary | ICD-10-CM | POA: Diagnosis not present

## 2021-10-25 DIAGNOSIS — Z992 Dependence on renal dialysis: Secondary | ICD-10-CM | POA: Diagnosis not present

## 2021-10-25 DIAGNOSIS — N186 End stage renal disease: Secondary | ICD-10-CM | POA: Diagnosis not present

## 2021-10-26 DIAGNOSIS — N186 End stage renal disease: Secondary | ICD-10-CM | POA: Diagnosis not present

## 2021-10-26 DIAGNOSIS — Z992 Dependence on renal dialysis: Secondary | ICD-10-CM | POA: Diagnosis not present

## 2021-10-27 DIAGNOSIS — N186 End stage renal disease: Secondary | ICD-10-CM | POA: Diagnosis not present

## 2021-10-27 DIAGNOSIS — Z992 Dependence on renal dialysis: Secondary | ICD-10-CM | POA: Diagnosis not present

## 2021-10-28 DIAGNOSIS — Z992 Dependence on renal dialysis: Secondary | ICD-10-CM | POA: Diagnosis not present

## 2021-10-28 DIAGNOSIS — N186 End stage renal disease: Secondary | ICD-10-CM | POA: Diagnosis not present

## 2021-10-29 DIAGNOSIS — Z992 Dependence on renal dialysis: Secondary | ICD-10-CM | POA: Diagnosis not present

## 2021-10-29 DIAGNOSIS — N186 End stage renal disease: Secondary | ICD-10-CM | POA: Diagnosis not present

## 2021-10-30 DIAGNOSIS — N186 End stage renal disease: Secondary | ICD-10-CM | POA: Diagnosis not present

## 2021-10-30 DIAGNOSIS — Z992 Dependence on renal dialysis: Secondary | ICD-10-CM | POA: Diagnosis not present

## 2021-10-31 DIAGNOSIS — N186 End stage renal disease: Secondary | ICD-10-CM | POA: Diagnosis not present

## 2021-10-31 DIAGNOSIS — Z992 Dependence on renal dialysis: Secondary | ICD-10-CM | POA: Diagnosis not present

## 2021-11-01 DIAGNOSIS — N186 End stage renal disease: Secondary | ICD-10-CM | POA: Diagnosis not present

## 2021-11-01 DIAGNOSIS — Z23 Encounter for immunization: Secondary | ICD-10-CM | POA: Diagnosis not present

## 2021-11-01 DIAGNOSIS — Z992 Dependence on renal dialysis: Secondary | ICD-10-CM | POA: Diagnosis not present

## 2021-11-02 ENCOUNTER — Other Ambulatory Visit: Payer: Self-pay | Admitting: Radiology

## 2021-11-02 DIAGNOSIS — Z23 Encounter for immunization: Secondary | ICD-10-CM | POA: Diagnosis not present

## 2021-11-02 DIAGNOSIS — Z992 Dependence on renal dialysis: Secondary | ICD-10-CM | POA: Diagnosis not present

## 2021-11-02 DIAGNOSIS — E119 Type 2 diabetes mellitus without complications: Secondary | ICD-10-CM | POA: Diagnosis not present

## 2021-11-02 DIAGNOSIS — N186 End stage renal disease: Secondary | ICD-10-CM | POA: Diagnosis not present

## 2021-11-02 DIAGNOSIS — Z794 Long term (current) use of insulin: Secondary | ICD-10-CM | POA: Diagnosis not present

## 2021-11-03 DIAGNOSIS — Z992 Dependence on renal dialysis: Secondary | ICD-10-CM | POA: Diagnosis not present

## 2021-11-03 DIAGNOSIS — Z23 Encounter for immunization: Secondary | ICD-10-CM | POA: Diagnosis not present

## 2021-11-03 DIAGNOSIS — N186 End stage renal disease: Secondary | ICD-10-CM | POA: Diagnosis not present

## 2021-11-03 NOTE — Anesthesia Preprocedure Evaluation (Signed)
Anesthesia Evaluation  Patient identified by MRN, date of birth, ID band Patient awake    Reviewed: Allergy & Precautions, H&P , NPO status , Patient's Chart, lab work & pertinent test results  Airway Mallampati: III  TM Distance: >3 FB Neck ROM: Full    Dental no notable dental hx. (+) Teeth Intact, Dental Advisory Given   Pulmonary neg pulmonary ROS,    Pulmonary exam normal breath sounds clear to auscultation       Cardiovascular Exercise Tolerance: Good hypertension, Pt. on medications  Rhythm:Regular Rate:Normal     Neuro/Psych negative neurological ROS  negative psych ROS   GI/Hepatic negative GI ROS, Neg liver ROS,   Endo/Other  Morbid obesity  Renal/GU Renal disease  negative genitourinary   Musculoskeletal  (+) Arthritis , Osteoarthritis,    Abdominal   Peds  Hematology  (+) Blood dyscrasia, anemia ,   Anesthesia Other Findings   Reproductive/Obstetrics negative OB ROS                            Anesthesia Physical Anesthesia Plan  ASA: 3  Anesthesia Plan: General   Post-op Pain Management: Tylenol PO (pre-op)*   Induction: Intravenous  PONV Risk Score and Plan: 4 or greater and Ondansetron, Dexamethasone and Treatment may vary due to age or medical condition  Airway Management Planned: Oral ETT  Additional Equipment:   Intra-op Plan:   Post-operative Plan: Extubation in OR  Informed Consent: I have reviewed the patients History and Physical, chart, labs and discussed the procedure including the risks, benefits and alternatives for the proposed anesthesia with the patient or authorized representative who has indicated his/her understanding and acceptance.     Dental advisory given  Plan Discussed with: CRNA  Anesthesia Plan Comments:        Anesthesia Quick Evaluation

## 2021-11-04 ENCOUNTER — Encounter (HOSPITAL_COMMUNITY): Payer: Self-pay

## 2021-11-04 ENCOUNTER — Encounter (HOSPITAL_COMMUNITY)
Admission: RE | Disposition: A | Discharge status: Home / Self Care | Payer: Self-pay | Source: Home / Self Care | Attending: Interventional Radiology

## 2021-11-04 ENCOUNTER — Ambulatory Visit (HOSPITAL_COMMUNITY)
Admission: RE | Admit: 2021-11-04 | Discharge: 2021-11-04 | Disposition: A | Discharge status: Home / Self Care | Payer: Medicare HMO | Attending: Interventional Radiology | Admitting: Interventional Radiology

## 2021-11-04 ENCOUNTER — Ambulatory Visit (HOSPITAL_COMMUNITY)
Admission: RE | Admit: 2021-11-04 | Discharge: 2021-11-04 | Disposition: A | Discharge status: Home / Self Care | Payer: Medicare HMO | Source: Ambulatory Visit | Attending: Interventional Radiology | Admitting: Interventional Radiology

## 2021-11-04 ENCOUNTER — Ambulatory Visit (HOSPITAL_COMMUNITY): Payer: Medicare HMO | Admitting: Physician Assistant

## 2021-11-04 ENCOUNTER — Ambulatory Visit (HOSPITAL_BASED_OUTPATIENT_CLINIC_OR_DEPARTMENT_OTHER): Payer: Medicare HMO | Admitting: Anesthesiology

## 2021-11-04 ENCOUNTER — Other Ambulatory Visit: Payer: Self-pay

## 2021-11-04 DIAGNOSIS — D3001 Benign neoplasm of right kidney: Secondary | ICD-10-CM | POA: Insufficient documentation

## 2021-11-04 DIAGNOSIS — M19042 Primary osteoarthritis, left hand: Secondary | ICD-10-CM | POA: Diagnosis not present

## 2021-11-04 DIAGNOSIS — N186 End stage renal disease: Secondary | ICD-10-CM | POA: Insufficient documentation

## 2021-11-04 DIAGNOSIS — R7303 Prediabetes: Secondary | ICD-10-CM

## 2021-11-04 DIAGNOSIS — N2889 Other specified disorders of kidney and ureter: Secondary | ICD-10-CM | POA: Diagnosis not present

## 2021-11-04 DIAGNOSIS — Z992 Dependence on renal dialysis: Secondary | ICD-10-CM | POA: Diagnosis not present

## 2021-11-04 DIAGNOSIS — C641 Malignant neoplasm of right kidney, except renal pelvis: Secondary | ICD-10-CM | POA: Diagnosis not present

## 2021-11-04 DIAGNOSIS — M19041 Primary osteoarthritis, right hand: Secondary | ICD-10-CM | POA: Diagnosis not present

## 2021-11-04 DIAGNOSIS — I10 Essential (primary) hypertension: Secondary | ICD-10-CM | POA: Diagnosis not present

## 2021-11-04 DIAGNOSIS — Z6839 Body mass index (BMI) 39.0-39.9, adult: Secondary | ICD-10-CM | POA: Insufficient documentation

## 2021-11-04 DIAGNOSIS — I120 Hypertensive chronic kidney disease with stage 5 chronic kidney disease or end stage renal disease: Secondary | ICD-10-CM | POA: Diagnosis not present

## 2021-11-04 DIAGNOSIS — D49511 Neoplasm of unspecified behavior of right kidney: Secondary | ICD-10-CM | POA: Diagnosis not present

## 2021-11-04 HISTORY — PX: RADIOLOGY WITH ANESTHESIA: SHX6223

## 2021-11-04 LAB — BASIC METABOLIC PANEL
Anion gap: 14 (ref 5–15)
BUN: 90 mg/dL — ABNORMAL HIGH (ref 8–23)
CO2: 24 mmol/L (ref 22–32)
Calcium: 9.9 mg/dL (ref 8.9–10.3)
Chloride: 103 mmol/L (ref 98–111)
Creatinine, Ser: 4.57 mg/dL — ABNORMAL HIGH (ref 0.44–1.00)
GFR, Estimated: 9 mL/min — ABNORMAL LOW (ref >60)
Glucose, Bld: 136 mg/dL — ABNORMAL HIGH (ref 70–99)
Potassium: 3.4 mmol/L — ABNORMAL LOW (ref 3.5–5.1)
Sodium: 141 mmol/L (ref 135–145)

## 2021-11-04 LAB — ABO/RH: ABO/RH(D): A POS

## 2021-11-04 SURGERY — CT WITH ANESTHESIA
Anesthesia: General

## 2021-11-04 MED ORDER — PROPOFOL 10 MG/ML IV BOLUS
INTRAVENOUS | Status: AC
  Filled 2021-11-04: qty 20

## 2021-11-04 MED ORDER — PHENYLEPHRINE 80 MCG/ML (10ML) SYRINGE FOR IV PUSH (FOR BLOOD PRESSURE SUPPORT)
PREFILLED_SYRINGE | INTRAVENOUS | Status: DC | PRN
  Administered 2021-11-04 (×2): 160 ug via INTRAVENOUS

## 2021-11-04 MED ORDER — SODIUM CHLORIDE 0.9 % IV SOLN: INTRAVENOUS | Status: DC | PRN

## 2021-11-04 MED ORDER — ACETAMINOPHEN 500 MG PO TABS
1000.0000 mg | ORAL_TABLET | Freq: Once | ORAL | Status: AC
  Administered 2021-11-04: 1000 mg via ORAL
  Filled 2021-11-04: qty 2

## 2021-11-04 MED ORDER — FENTANYL CITRATE PF 50 MCG/ML IJ SOSY
25.0000 ug | PREFILLED_SYRINGE | INTRAMUSCULAR | Status: DC | PRN
  Administered 2021-11-04: 25 ug via INTRAVENOUS

## 2021-11-04 MED ORDER — FENTANYL CITRATE PF 50 MCG/ML IJ SOSY
PREFILLED_SYRINGE | INTRAMUSCULAR | Status: AC
  Filled 2021-11-04: qty 1

## 2021-11-04 MED ORDER — SUGAMMADEX SODIUM 200 MG/2ML IV SOLN
INTRAVENOUS | Status: DC | PRN
  Administered 2021-11-04: 200 mg via INTRAVENOUS

## 2021-11-04 MED ORDER — PHENYLEPHRINE HCL-NACL 20-0.9 MG/250ML-% IV SOLN
INTRAVENOUS | Status: DC | PRN
  Administered 2021-11-04: 40 ug/min via INTRAVENOUS

## 2021-11-04 MED ORDER — ROCURONIUM BROMIDE 100 MG/10ML IV SOLN
INTRAVENOUS | Status: DC | PRN
  Administered 2021-11-04: 10 mg via INTRAVENOUS
  Administered 2021-11-04: 50 mg via INTRAVENOUS

## 2021-11-04 MED ORDER — HYDROCODONE-ACETAMINOPHEN 5-325 MG PO TABS: 1.0000 | ORAL_TABLET | Freq: Four times a day (QID) | ORAL | 0 refills | Status: AC | PRN

## 2021-11-04 MED ORDER — PROPOFOL 10 MG/ML IV BOLUS
INTRAVENOUS | Status: DC | PRN
  Administered 2021-11-04: 100 mg via INTRAVENOUS

## 2021-11-04 MED ORDER — HYDROCODONE-ACETAMINOPHEN 5-325 MG PO TABS
ORAL_TABLET | ORAL | Status: AC
  Filled 2021-11-04: qty 2

## 2021-11-04 MED ORDER — LACTATED RINGERS IV SOLN: INTRAVENOUS | Status: DC

## 2021-11-04 MED ORDER — FENTANYL CITRATE (PF) 100 MCG/2ML IJ SOLN
INTRAMUSCULAR | Status: DC | PRN
  Administered 2021-11-04: 100 ug via INTRAVENOUS

## 2021-11-04 MED ORDER — ORAL CARE MOUTH RINSE: 15.0000 mL | Freq: Once | OROMUCOSAL | Status: AC

## 2021-11-04 MED ORDER — ONDANSETRON HCL 4 MG/2ML IJ SOLN
INTRAMUSCULAR | Status: DC | PRN
  Administered 2021-11-04: 4 mg via INTRAVENOUS

## 2021-11-04 MED ORDER — CHLORHEXIDINE GLUCONATE 0.12 % MT SOLN
15.0000 mL | Freq: Once | OROMUCOSAL | Status: AC
  Administered 2021-11-04: 15 mL via OROMUCOSAL

## 2021-11-04 MED ORDER — HYDROCODONE-ACETAMINOPHEN 5-325 MG PO TABS
1.0000 | ORAL_TABLET | ORAL | Status: DC | PRN
  Administered 2021-11-04: 1 via ORAL

## 2021-11-04 MED ORDER — IOHEXOL 300 MG/ML  SOLN
100.0000 mL | Freq: Once | INTRAMUSCULAR | Status: AC | PRN
  Administered 2021-11-04: 100 mL via INTRAVENOUS

## 2021-11-04 MED ORDER — LIDOCAINE HCL (CARDIAC) PF 100 MG/5ML IV SOSY
PREFILLED_SYRINGE | INTRAVENOUS | Status: DC | PRN
  Administered 2021-11-04: 60 mg via INTRAVENOUS

## 2021-11-04 MED ORDER — SODIUM CHLORIDE (PF) 0.9 % IJ SOLN
INTRAMUSCULAR | Status: AC
  Filled 2021-11-04: qty 50

## 2021-11-04 MED ORDER — FENTANYL CITRATE (PF) 100 MCG/2ML IJ SOLN
INTRAMUSCULAR | Status: AC
  Filled 2021-11-04: qty 2

## 2021-11-04 NOTE — Sedation Documentation (Signed)
Anesthesia at bedside to sedate and monitor.

## 2021-11-04 NOTE — H&P (Addendum)
Referring Physician(s): Sninsky,B  Supervising Physician: Jacqulynn Cadet  Patient Status:  [facility] OP   Chief Complaint:  Right renal mass  Subjective: Pt familiar to IR service from consultation with Dr. Laurence Ferrari on 10/06/2021 to discuss treatment options for right renal mass. She is an 81 y.o. 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  While undergoing work-up for her underlying renal dysfunction, a 3.5 cm lesion was identified in the lower pole of the right kidney.  Follow-up MRIs were performed without contrast in November 2022 and January 2023 demonstrating slight interval growth of the complex cystic lesion.  Subsequently, she underwent further evaluation with contrast-enhanced CT scan of the abdomen on 09/14/2021 which demonstrates an avidly enhancing an enlarging mass exophytic from the lower pole of the right kidney consistent with an enlarging renal cell carcinoma.  The mass measures 3.8 x 3.8 x 4.2 cm.  Following discussions with Dr. Laurence Ferrari she was deemed an appropriate candidate for CT-guided microwave ablation and possible biopsy of the right renal mass and presents today for the procedure.  She currently denies fever, headache, chest pain, dyspnea, cough, abdominal/back pain, nausea, vomiting, dysuria, hematuria.  Past Medical History:  Diagnosis Date   Anemia    Arthritis    hands   CKD (chronic kidney disease), stage IV (HCC)    Hypertension    Pre-diabetes    Renal insufficiency    Vertigo 2019   2 episodes.  none in over 1 yr.   Past Surgical History:  Procedure Laterality Date   CAPD INSERTION N/A 03/12/2021   Procedure: LAPAROSCOPIC INSERTION CONTINUOUS AMBULATORY PERITONEAL DIALYSIS  (CAPD) CATHETER;  Surgeon: Olean Ree, MD;  Location: [facility];  Service: General;  Laterality: N/A;  Provider requesting 2 hours / 120 minutes for procedure.   CATARACT EXTRACTION W/PHACO Left 06/28/2018   Procedure: CATARACT EXTRACTION  PHACO AND INTRAOCULAR LENS PLACEMENT ([place])  LEFT;  Surgeon: Leandrew Koyanagi, MD;  Location: [facility];  Service: Ophthalmology;  Laterality: Left;   CATARACT EXTRACTION W/PHACO Right 07/19/2018   Procedure: CATARACT EXTRACTION PHACO AND INTRAOCULAR LENS PLACEMENT ([place]) RIGHT;  Surgeon: Leandrew Koyanagi, MD;  Location: [facility];  Service: Ophthalmology;  Laterality: Right;   DILATION AND CURETTAGE OF UTERUS     IR RADIOLOGIST EVAL & MGMT  10/06/2021   PARATHYROIDECTOMY     REPLACEMENT TOTAL KNEE Bilateral 07/03/2003   left - 07/03/03, right - 07/26/12      Allergies: Contrast media [iodinated contrast media] and Iodine  Medications: Prior to Admission medications   Medication Sig Start Date End Date Taking? Authorizing Provider  acetaminophen (TYLENOL) 500 MG tablet Take 1,000 mg by mouth every 6 (six) hours as needed for moderate pain or mild pain.   Yes [provider]  allopurinol (ZYLOPRIM) 100 MG tablet Take 100 mg by mouth in the morning.   Yes [provider]  aspirin EC 81 MG tablet Take 81 mg by mouth in the morning.   Yes [provider]  carvedilol (COREG) 12.5 MG tablet Take 12.5 mg by mouth 2 (two) times daily with a meal.   Yes [provider]  Cholecalciferol (VITAMIN D3) 50 MCG (2000 UT) TABS Take 2,000 Units by mouth in the morning.   Yes [provider]  docusate sodium (COLACE) 100 MG capsule Take 200 mg by mouth in the morning.   Yes [provider]  furosemide (LASIX) 80 MG tablet Take 80 mg by mouth daily as needed  for fluid.   Yes [provider]  Polyethyl Glycol-Propyl Glycol (SYSTANE) 0.4-0.3 % SOLN Place 1 drop into both eyes in the morning.   Yes [provider]  simvastatin (ZOCOR) 20 MG tablet Take 20 mg by mouth at bedtime.   Yes [provider]  sodium chloride (MURO 128) 5 % ophthalmic ointment Place 1 application into both eyes at bedtime.   Yes  [provider]     Vital Signs: BP (!) 154/84 (BP Location: Right Arm)   Pulse 84   Temp (!) 97.5 F (36.4 C) (Oral)   Resp 15   Ht '4\' 11"'$  (1.499 m)   Wt 195 lb (88.5 kg)   SpO2 98%   BMI 39.39 kg/m   Physical Exam awake, alert.  Chest with sl dim BS bases.  Heart with regular rate and rhythm.  Abdomen soft, positive bowel sounds, nontender.  No significant lower extremity edema.  Imaging: No results found.  Labs:  CBC: Recent Labs    03/09/21 1334 03/12/21 1248 10/22/21 1300  WBC 7.6  --  7.6  HGB 9.1* 9.2* 10.3*  HCT 28.5* 27.0* 31.4*  PLT 221  --  202    COAGS: Recent Labs    03/09/21 1334 10/22/21 1300  INR 1.1 1.1  APTT 29  --     BMP: Recent Labs    03/12/21 1248  NA 137  K 4.6  CL 108  GLUCOSE 105*  BUN 125*  CREATININE 4.20*    LIVER FUNCTION TESTS: No results for input(s): "BILITOT", "AST", "ALT", "ALKPHOS", "PROT", "ALBUMIN" in the last 8760 hours.  Assessment and Plan: Pt familiar to IR service from consultation with Dr. Laurence Ferrari on 10/06/2021 to discuss treatment options for right renal mass. She is an 81 y.o. 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  While undergoing work-up for her underlying renal dysfunction, a 3.5 cm lesion was identified in the lower pole of the right kidney.  Follow-up MRIs were performed without contrast in November 2022 and January 2023 demonstrating slight interval growth of the complex cystic lesion.  Subsequently, she underwent further evaluation with contrast-enhanced CT scan of the abdomen on 09/14/2021 which demonstrates an avidly enhancing an enlarging mass exophytic from the lower pole of the right kidney consistent with an enlarging renal cell carcinoma.  The mass measures 3.8 x 3.8 x 4.2 cm.  Following discussions with Dr. Laurence Ferrari she was deemed an appropriate candidate for CT-guided microwave ablation and possible biopsy of the right renal mass and  presents today for the procedure.  Details/risks of procedure, including but not limited to, internal bleeding, infection, injury to adjacent structures, anesthesia related complications discussed with patient with her understanding and consent.  This procedure involves the use of X-rays and because of the nature of the planned procedure, it is possible that we will have prolonged use of X-ray fluoroscopy.  Potential radiation risks to you include (but are not limited to) the following: - A slightly elevated risk for cancer  several years later in life. This risk is typically less than 0.5% percent. This risk is low in comparison to the normal incidence of human cancer, which is 33% for women and 50% for men according to the [employer]. - Radiation induced injury can include skin redness, resembling a rash, tissue breakdown / ulcers and hair loss (which can be temporary or permanent).   The likelihood of either of these occurring depends on the difficulty of the  procedure and whether you are sensitive to radiation due to previous procedures, disease, or genetic conditions.   IF your procedure requires a prolonged use of radiation, you will be notified and given written instructions for further action.  It is your responsibility to monitor the irradiated area for the 2 weeks following the procedure and to notify your physician if you are concerned that you have suffered a radiation induced injury.      Electronically Signed: D. Rowe Robert, PA-C 11/04/2021, 8:04 AM   I spent a total of 25 Minutes at the the patient's bedside AND on the patient's hospital floor or unit, greater than 50% of which was counseling/coordinating care for CT-guided microwave ablation and possible biopsy of right renal mass

## 2021-11-04 NOTE — Procedures (Signed)
Interventional Radiology Procedure Note  Procedure:  1.) CT guided biopsy of RIGHT renal lesion 2.) MWA right renal lesion   Complications: None  Estimated Blood Loss: None  Recommendations: - PACU for recovery - Bedrest x 2-4 hrs - Consider DC home   Signed,  Criselda Peaches, MD

## 2021-11-04 NOTE — Anesthesia Postprocedure Evaluation (Signed)
Anesthesia Post Note  Patient: Laura Meyer  Procedure(s) Performed: CT MICROWAVE ABLATION WITH ANESTHESIA     Patient location during evaluation: PACU Anesthesia Type: General Level of consciousness: awake and alert Pain management: pain level controlled Vital Signs Assessment: post-procedure vital signs reviewed and stable Respiratory status: spontaneous breathing, nonlabored ventilation and respiratory function stable Cardiovascular status: blood pressure returned to baseline and stable Postop Assessment: no apparent nausea or vomiting Anesthetic complications: no   No notable events documented.  Last Vitals:  Vitals:   11/04/21 1130 11/04/21 1145  BP: (!) 144/65 (!) 165/74  Pulse: 72 70  Resp: 13 12  Temp:    SpO2: 92% 93%    Last Pain:  Vitals:   11/04/21 1145  TempSrc:   PainSc: 5                  Josi Roediger,W. EDMOND

## 2021-11-04 NOTE — Anesthesia Procedure Notes (Signed)
Procedure Name: Intubation Date/Time: 11/04/2021 8:48 AM  Performed by: [place] Manus Rudd, CRNAPre-anesthesia Checklist: Patient identified, Emergency Drugs available, Suction available and Patient being monitored Patient Re-evaluated:Patient Re-evaluated prior to induction Oxygen Delivery Method: Circle system utilized Preoxygenation: Pre-oxygenation with 100% oxygen Induction Type: IV induction Ventilation: Mask ventilation without difficulty Laryngoscope Size: Mac and 3 Grade View: Grade I Tube type: Oral Tube size: 7.0 mm Number of attempts: 1 Airway Equipment and Method: Stylet and Oral airway Placement Confirmation: ETT inserted through vocal cords under direct vision, positive ETCO2 and breath sounds checked- equal and bilateral Secured at: 21 cm Tube secured with: Tape Dental Injury: Teeth and Oropharynx as per pre-operative assessment

## 2021-11-04 NOTE — Progress Notes (Signed)
Dr.McCullough stated patient is cleared to discharge without voiding, reviewed discharge instructions with patient and daughter.

## 2021-11-04 NOTE — Discharge Instructions (Signed)
May resume home medications; resume aspirin on 11/05/21; avoid strenuous activity for 3-4 days

## 2021-11-04 NOTE — Discharge Summary (Signed)
Patient ID: Laura Meyer MRN: [id number] DOB/AGE: 08-05-1940 81 y.o.  Admit date: 11/04/2021 Discharge date: 11/04/2021  Supervising Physician: Jacqulynn Cadet  Patient Status: [facility] OP  Admission Diagnoses: right renal mass  Discharge Diagnoses: right renal mass, s/p CT guided core biopsy and microwave ablation on 11/04/21  Active Problems:   * No active hospital problems. *  Past Medical History:  Diagnosis Date   Anemia    Arthritis    hands   CKD (chronic kidney disease), stage IV (HCC)    Hypertension    Pre-diabetes    Renal insufficiency    Vertigo 2019   2 episodes.  none in over 1 yr.   Past Surgical History:  Procedure Laterality Date   CAPD INSERTION N/A 03/12/2021   Procedure: LAPAROSCOPIC INSERTION CONTINUOUS AMBULATORY PERITONEAL DIALYSIS  (CAPD) CATHETER;  Surgeon: Olean Ree, MD;  Location: [facility];  Service: General;  Laterality: N/A;  Provider requesting 2 hours / 120 minutes for procedure.   CATARACT EXTRACTION W/PHACO Left 06/28/2018   Procedure: CATARACT EXTRACTION PHACO AND INTRAOCULAR LENS PLACEMENT ([place])  LEFT;  Surgeon: Leandrew Koyanagi, MD;  Location: [facility];  Service: Ophthalmology;  Laterality: Left;   CATARACT EXTRACTION W/PHACO Right 07/19/2018   Procedure: CATARACT EXTRACTION PHACO AND INTRAOCULAR LENS PLACEMENT ([place]) RIGHT;  Surgeon: Leandrew Koyanagi, MD;  Location: [facility];  Service: Ophthalmology;  Laterality: Right;   DILATION AND CURETTAGE OF UTERUS     IR RADIOLOGIST EVAL & MGMT  10/06/2021   PARATHYROIDECTOMY     REPLACEMENT TOTAL KNEE Bilateral 07/03/2003   left - 07/03/03, right - 07/26/12     Discharged Condition: good  Hospital Course: Laura Meyer is an 81 yo female familiar to IR service from consultation with Dr. Laurence Ferrari on 10/06/2021 to discuss treatment options for right renal mass. She is an 81 y.o. 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  While  undergoing work-up for her underlying renal dysfunction, a 3.5 cm lesion was identified in the lower pole of the right kidney.  Follow-up MRIs were performed without contrast in November 2022 and January 2023 demonstrating slight interval growth of the complex cystic lesion.  Subsequently, she underwent further evaluation with contrast-enhanced CT scan of the abdomen on 09/14/2021 which demonstrates an avidly enhancing an enlarging mass exophytic from the lower pole of the right kidney consistent with an enlarging renal cell carcinoma.  The mass measures 3.8 x 3.8 x 4.2 cm.  Following discussions with Dr. Laurence Ferrari she was deemed an appropriate candidate for CT-guided microwave ablation and  biopsy of the right renal mass and underwent the procedures on 11/04/21. The procedure was performed without immediate complications and via general anesthesia. She was subsequently extubated and observed in the PACU for 6 hours. She was seen by Dr. Laurence Ferrari postprocedure and was noted to be stable with only mild right flank discomfort. She denies fever,HA,CP, dyspnea, cough, abd pain,N/V or bleeding. She was deemed stable for dc home today. We will f/u with pt in 2-3 weeks. She can resume her home meds and resume aspirin on 10/5. Electronic prescription was sent to pt's pharmacy for vicodin 5/325, #10, no refills, 1-2 tabs every 6 hrs as needed. Pt was told to contact our service with any questions. Daughter updated.   Consults: anesthesia  Significant Diagnostic Studies:  Results for orders placed or performed during the hospital encounter of 94/76/54  Basic metabolic panel  Result Value Ref Range   Sodium 141 135 -  145 mmol/L   Potassium 3.4 (L) 3.5 - 5.1 mmol/L   Chloride 103 98 - 111 mmol/L   CO2 24 22 - 32 mmol/L   Glucose, Bld 136 (H) 70 - 99 mg/dL   BUN 90 (H) 8 - 23 mg/dL   Creatinine, Ser 4.57 (H) 0.44 - 1.00 mg/dL   Calcium 9.9 8.9 - 10.3 mg/dL   GFR, Estimated 9 (L) >60 mL/min   Anion gap 14 5 - 15   ABO/Rh  Result Value Ref Range   ABO/RH(D)      A POS Performed at [facility] [address], [place], [place] [address]       Treatments: CT guided core biopsy and microwave ablation of right renal mass via general anesthesia  Discharge Exam: Blood pressure (!) 155/69, pulse 62, temperature (!) 97.5 F (36.4 C), resp. rate 13, height '4\' 11"'$  (1.499 m), weight 195 lb (88.5 kg), SpO2 97 %. Awake/alert; chest with sl dim BS bases.  Heart with regular rate and rhythm.  Abdomen soft, positive bowel sounds, nontender;  puncture site rt flank covered with clean gauze dressing, mildly tender to palpation; no significant lower extremity edema.  Disposition: Discharge disposition: 01-Home or Self Care       Discharge Instructions     Call MD for:  difficulty breathing, headache or visual disturbances   Complete by: As directed    Call MD for:  extreme fatigue   Complete by: As directed    Call MD for:  hives   Complete by: As directed    Call MD for:  persistant dizziness or light-headedness   Complete by: As directed    Call MD for:  persistant nausea and vomiting   Complete by: As directed    Call MD for:  redness, tenderness, or signs of infection (pain, swelling, redness, odor or green/yellow discharge around incision site)   Complete by: As directed    Call MD for:  severe uncontrolled pain   Complete by: As directed    Call MD for:  temperature >100.4   Complete by: As directed    Change dressing (specify)   Complete by: As directed    May change bandage over right flank region and apply bandaid to site daily for next 2-3 days; may wash site with soap and water   Diet - low sodium heart healthy   Complete by: As directed    Discharge instructions   Complete by: As directed    May resume home medications; may resume aspirin on 11/05/21   Driving Restrictions   Complete by: As directed    No driving for next 24 hours or after taking narcotic  medication   Increase activity slowly   Complete by: As directed    Lifting restrictions   Complete by: As directed    Avoid heavy lifting for next 3-4 days   May shower / Bathe   Complete by: As directed    May walk up steps   Complete by: As directed       Allergies as of 11/04/2021       Reactions   Contrast Media [iodinated Contrast Media]    Avoids due to kidney issues   Iodine    Other reaction(s): Other (See Comments) Kidney disease        Medication List     TAKE these medications    acetaminophen 500 MG tablet Commonly known as: TYLENOL Take 1,000 mg by mouth every 6 (six)  hours as needed for moderate pain or mild pain.   allopurinol 100 MG tablet Commonly known as: ZYLOPRIM Take 100 mg by mouth in the morning.   aspirin EC 81 MG tablet Take 81 mg by mouth in the morning.   carvedilol 12.5 MG tablet Commonly known as: COREG Take 12.5 mg by mouth 2 (two) times daily with a meal.   docusate sodium 100 MG capsule Commonly known as: COLACE Take 200 mg by mouth in the morning.   furosemide 80 MG tablet Commonly known as: LASIX Take 80 mg by mouth daily as needed for fluid.   HYDROcodone-acetaminophen 5-325 MG tablet Commonly known as: Norco Take 1-2 tablets by mouth every 6 (six) hours as needed for moderate pain.   simvastatin 20 MG tablet Commonly known as: ZOCOR Take 20 mg by mouth at bedtime.   sodium chloride 5 % ophthalmic ointment Commonly known as: MURO 409 Place 1 application into both eyes at bedtime.   Systane 0.4-0.3 % Soln Generic drug: Polyethyl Glycol-Propyl Glycol Place 1 drop into both eyes in the morning.   Vitamin D3 50 MCG (2000 UT) Tabs Take 2,000 Units by mouth in the morning.               Discharge Care Instructions  (From admission, onward)           Start     Ordered   11/04/21 0000  Change dressing (specify)       Comments: May change bandage over right flank region and apply bandaid to site daily  for next 2-3 days; may wash site with soap and water   11/04/21 1438            Follow-up Information     Criselda Peaches, MD Follow up.   Specialties: Interventional Radiology, Radiology Why: Radiology team will call you with follow up with Dr. Laurence Ferrari in 2-3 weeks; call [phone] or [phone] with any questions Contact information: [address] [place] [address] [phone]         Billey Co, MD Follow up.   Specialty: Urology Why: continue follow up with Dr. Diamantina Providence as scheduled Contact information: [place] [address] [phone]                  Electronically Signed: D. Rowe Robert, PA-C 11/04/2021, 2:42 PM   I have spent Less Than 30 Minutes discharging Laura Meyer.

## 2021-11-04 NOTE — Transfer of Care (Signed)
Immediate Anesthesia Transfer of Care Note  Patient: Laura Meyer  Procedure(s) Performed: CT MICROWAVE ABLATION WITH ANESTHESIA  Patient Location: PACU  Anesthesia Type:General  Level of Consciousness: awake, alert  and oriented  Airway & Oxygen Therapy: Patient Spontanous Breathing  Post-op Assessment: Report given to RN and Post -op Vital signs reviewed and stable  Post vital signs: Reviewed and stable  Last Vitals:  Vitals Value Taken Time  BP 156/77 11/04/21 1121  Temp    Pulse 73 11/04/21 1125  Resp 15 11/04/21 1125  SpO2 95 % 11/04/21 1125  Vitals shown include unvalidated device data.  Last Pain:  Vitals:   11/04/21 0735  TempSrc: Oral         Complications: No notable events documented.

## 2021-11-05 ENCOUNTER — Ambulatory Visit: Payer: Medicare HMO | Admitting: Urology

## 2021-11-05 ENCOUNTER — Encounter (HOSPITAL_COMMUNITY): Payer: Self-pay | Admitting: Interventional Radiology

## 2021-11-05 DIAGNOSIS — Z992 Dependence on renal dialysis: Secondary | ICD-10-CM | POA: Diagnosis not present

## 2021-11-05 DIAGNOSIS — Z23 Encounter for immunization: Secondary | ICD-10-CM | POA: Diagnosis not present

## 2021-11-05 DIAGNOSIS — N186 End stage renal disease: Secondary | ICD-10-CM | POA: Diagnosis not present

## 2021-11-06 DIAGNOSIS — Z23 Encounter for immunization: Secondary | ICD-10-CM | POA: Diagnosis not present

## 2021-11-06 DIAGNOSIS — N186 End stage renal disease: Secondary | ICD-10-CM | POA: Diagnosis not present

## 2021-11-06 DIAGNOSIS — Z992 Dependence on renal dialysis: Secondary | ICD-10-CM | POA: Diagnosis not present

## 2021-11-07 DIAGNOSIS — N186 End stage renal disease: Secondary | ICD-10-CM | POA: Diagnosis not present

## 2021-11-07 DIAGNOSIS — Z23 Encounter for immunization: Secondary | ICD-10-CM | POA: Diagnosis not present

## 2021-11-07 DIAGNOSIS — Z992 Dependence on renal dialysis: Secondary | ICD-10-CM | POA: Diagnosis not present

## 2021-11-08 DIAGNOSIS — Z23 Encounter for immunization: Secondary | ICD-10-CM | POA: Diagnosis not present

## 2021-11-08 DIAGNOSIS — N186 End stage renal disease: Secondary | ICD-10-CM | POA: Diagnosis not present

## 2021-11-08 DIAGNOSIS — Z992 Dependence on renal dialysis: Secondary | ICD-10-CM | POA: Diagnosis not present

## 2021-11-09 DIAGNOSIS — N186 End stage renal disease: Secondary | ICD-10-CM | POA: Diagnosis not present

## 2021-11-09 DIAGNOSIS — Z23 Encounter for immunization: Secondary | ICD-10-CM | POA: Diagnosis not present

## 2021-11-09 DIAGNOSIS — Z992 Dependence on renal dialysis: Secondary | ICD-10-CM | POA: Diagnosis not present

## 2021-11-09 LAB — SURGICAL PATHOLOGY

## 2021-11-10 DIAGNOSIS — Z992 Dependence on renal dialysis: Secondary | ICD-10-CM | POA: Diagnosis not present

## 2021-11-10 DIAGNOSIS — N186 End stage renal disease: Secondary | ICD-10-CM | POA: Diagnosis not present

## 2021-11-10 DIAGNOSIS — Z23 Encounter for immunization: Secondary | ICD-10-CM | POA: Diagnosis not present

## 2021-11-11 ENCOUNTER — Other Ambulatory Visit: Payer: Self-pay | Admitting: Interventional Radiology

## 2021-11-11 DIAGNOSIS — N2889 Other specified disorders of kidney and ureter: Secondary | ICD-10-CM

## 2021-11-11 DIAGNOSIS — Z23 Encounter for immunization: Secondary | ICD-10-CM | POA: Diagnosis not present

## 2021-11-11 DIAGNOSIS — Z992 Dependence on renal dialysis: Secondary | ICD-10-CM | POA: Diagnosis not present

## 2021-11-11 DIAGNOSIS — N186 End stage renal disease: Secondary | ICD-10-CM | POA: Diagnosis not present

## 2021-11-12 DIAGNOSIS — Z23 Encounter for immunization: Secondary | ICD-10-CM | POA: Diagnosis not present

## 2021-11-12 DIAGNOSIS — N186 End stage renal disease: Secondary | ICD-10-CM | POA: Diagnosis not present

## 2021-11-12 DIAGNOSIS — Z992 Dependence on renal dialysis: Secondary | ICD-10-CM | POA: Diagnosis not present

## 2021-11-13 DIAGNOSIS — Z23 Encounter for immunization: Secondary | ICD-10-CM | POA: Diagnosis not present

## 2021-11-13 DIAGNOSIS — N186 End stage renal disease: Secondary | ICD-10-CM | POA: Diagnosis not present

## 2021-11-13 DIAGNOSIS — Z992 Dependence on renal dialysis: Secondary | ICD-10-CM | POA: Diagnosis not present

## 2021-11-14 DIAGNOSIS — Z992 Dependence on renal dialysis: Secondary | ICD-10-CM | POA: Diagnosis not present

## 2021-11-14 DIAGNOSIS — N186 End stage renal disease: Secondary | ICD-10-CM | POA: Diagnosis not present

## 2021-11-14 DIAGNOSIS — Z23 Encounter for immunization: Secondary | ICD-10-CM | POA: Diagnosis not present

## 2021-11-15 DIAGNOSIS — Z23 Encounter for immunization: Secondary | ICD-10-CM | POA: Diagnosis not present

## 2021-11-15 DIAGNOSIS — Z992 Dependence on renal dialysis: Secondary | ICD-10-CM | POA: Diagnosis not present

## 2021-11-15 DIAGNOSIS — N186 End stage renal disease: Secondary | ICD-10-CM | POA: Diagnosis not present

## 2021-11-16 DIAGNOSIS — Z992 Dependence on renal dialysis: Secondary | ICD-10-CM | POA: Diagnosis not present

## 2021-11-16 DIAGNOSIS — N186 End stage renal disease: Secondary | ICD-10-CM | POA: Diagnosis not present

## 2021-11-16 DIAGNOSIS — Z23 Encounter for immunization: Secondary | ICD-10-CM | POA: Diagnosis not present

## 2021-11-17 DIAGNOSIS — Z992 Dependence on renal dialysis: Secondary | ICD-10-CM | POA: Diagnosis not present

## 2021-11-17 DIAGNOSIS — Z23 Encounter for immunization: Secondary | ICD-10-CM | POA: Diagnosis not present

## 2021-11-17 DIAGNOSIS — N186 End stage renal disease: Secondary | ICD-10-CM | POA: Diagnosis not present

## 2021-11-18 DIAGNOSIS — Z992 Dependence on renal dialysis: Secondary | ICD-10-CM | POA: Diagnosis not present

## 2021-11-18 DIAGNOSIS — Z23 Encounter for immunization: Secondary | ICD-10-CM | POA: Diagnosis not present

## 2021-11-18 DIAGNOSIS — N186 End stage renal disease: Secondary | ICD-10-CM | POA: Diagnosis not present

## 2021-11-19 ENCOUNTER — Ambulatory Visit
Admission: RE | Admit: 2021-11-19 | Discharge: 2021-11-19 | Disposition: A | Discharge status: Home / Self Care | Payer: Medicare HMO | Source: Ambulatory Visit | Attending: Interventional Radiology | Admitting: Interventional Radiology

## 2021-11-19 DIAGNOSIS — N2889 Other specified disorders of kidney and ureter: Secondary | ICD-10-CM | POA: Diagnosis not present

## 2021-11-19 DIAGNOSIS — Z9889 Other specified postprocedural states: Secondary | ICD-10-CM | POA: Diagnosis not present

## 2021-11-19 DIAGNOSIS — N186 End stage renal disease: Secondary | ICD-10-CM | POA: Diagnosis not present

## 2021-11-19 DIAGNOSIS — Z23 Encounter for immunization: Secondary | ICD-10-CM | POA: Diagnosis not present

## 2021-11-19 DIAGNOSIS — Z992 Dependence on renal dialysis: Secondary | ICD-10-CM | POA: Diagnosis not present

## 2021-11-19 HISTORY — PX: IR RADIOLOGIST EVAL & MGMT: IMG5224

## 2021-11-19 NOTE — Progress Notes (Signed)
Chief Complaint: Patient was consulted remotely today (TeleHealth) for right renal oncocytic neoplasm at the request of Harish Bram K.    Referring Physician(s): Dr. Nickolas Madrid  History of Present Illness: Laura Meyer is a 81 y.o. 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  While undergoing work-up for her underlying renal dysfunction, a 3.5 cm lesion was identified in the lower pole of the right kidney.  Follow-up MRIs were performed without contrast in November 2022 and January 2023 demonstrating slight interval growth of the complex cystic lesion.  Subsequently, she underwent further evaluation with contrast-enhanced CT scan of the abdomen on 09/14/2021 which demonstrates an avidly enhancing an enlarging mass exophytic from the lower pole of the right kidney consistent with an enlarging renal cell carcinoma.  The mass measures 3.8 x 3.8 x 4.2 cm.  She underwent percutaneous biopsy and concurrent microwave ablation on 11/04/2021.  We spoke over the phone today for her initial 2-week follow-up evaluation.  She had some discomfort and numbness following the procedure but this was quite manageable.  She has now returned completely to her baseline.  She denies hematuria, dysuria or other persistent symptoms.  Surgical pathology revealed a low-grade oncocytic neoplasm.  This is a relatively new neoplastic classification.  To date, these tumors have not been shown to be aggressive.  This is very good news.  I reviewed these biopsy results with Laura Meyer.  She is otherwise doing clinically well and has no active complaints.  Past Medical History:  Diagnosis Date   Anemia    Arthritis    hands   CKD (chronic kidney disease), stage IV (HCC)    Hypertension    Pre-diabetes    Renal insufficiency    Vertigo 2019   2 episodes.  none in over 1 yr.    Past Surgical History:  Procedure Laterality Date   CAPD INSERTION N/A 03/12/2021    Procedure: LAPAROSCOPIC INSERTION CONTINUOUS AMBULATORY PERITONEAL DIALYSIS  (CAPD) CATHETER;  Surgeon: Olean Ree, MD;  Location: [facility];  Service: General;  Laterality: N/A;  Provider requesting 2 hours / 120 minutes for procedure.   CATARACT EXTRACTION W/PHACO Left 06/28/2018   Procedure: CATARACT EXTRACTION PHACO AND INTRAOCULAR LENS PLACEMENT ([place])  LEFT;  Surgeon: Leandrew Koyanagi, MD;  Location: [place];  Service: Ophthalmology;  Laterality: Left;   CATARACT EXTRACTION W/PHACO Right 07/19/2018   Procedure: CATARACT EXTRACTION PHACO AND INTRAOCULAR LENS PLACEMENT ([place]) RIGHT;  Surgeon: Leandrew Koyanagi, MD;  Location: [place];  Service: Ophthalmology;  Laterality: Right;   DILATION AND CURETTAGE OF UTERUS     IR RADIOLOGIST EVAL & MGMT  10/06/2021   PARATHYROIDECTOMY     RADIOLOGY WITH ANESTHESIA N/A 11/04/2021   Procedure: CT MICROWAVE ABLATION WITH ANESTHESIA;  Surgeon: Criselda Peaches, MD;  Location: [facility];  Service: Radiology;  Laterality: N/A;   REPLACEMENT TOTAL KNEE Bilateral 07/03/2003   left - 07/03/03, right - 07/26/12    Allergies: Contrast media [iodinated contrast media] and Iodine  Medications: Prior to Admission medications   Medication Sig Start Date End Date Taking? Authorizing Provider  acetaminophen (TYLENOL) 500 MG tablet Take 1,000 mg by mouth every 6 (six) hours as needed for moderate pain or mild pain.    [provider]  allopurinol (ZYLOPRIM) 100 MG tablet Take 100 mg by mouth in the morning.    [provider]  aspirin EC 81 MG tablet Take 81 mg by mouth in the morning.  [provider]  carvedilol (COREG) 12.5 MG tablet Take 12.5 mg by mouth 2 (two) times daily with a meal.    [provider]  Cholecalciferol (VITAMIN D3) 50 MCG (2000 UT) TABS Take 2,000 Units by mouth in the morning.    [provider]  docusate sodium (COLACE) 100 MG capsule Take 200 mg by mouth in the morning.     [provider]  furosemide (LASIX) 80 MG tablet Take 80 mg by mouth daily as needed for fluid.    [provider]  HYDROcodone-acetaminophen (NORCO) 5-325 MG tablet Take 1-2 tablets by mouth every 6 (six) hours as needed for moderate pain. 11/04/21   Allred, Darrell K, PA-C  Polyethyl Glycol-Propyl Glycol (SYSTANE) 0.4-0.3 % SOLN Place 1 drop into both eyes in the morning.    [provider]  simvastatin (ZOCOR) 20 MG tablet Take 20 mg by mouth at bedtime.    [provider]  sodium chloride (MURO 128) 5 % ophthalmic ointment Place 1 application into both eyes at bedtime.    [provider]     No family history on file.  Social History   Socioeconomic History   Marital status: Widowed    Spouse name: Not on file   Number of children: Not on file   Years of education: Not on file   Highest education level: Not on file  Occupational History   Not on file  Tobacco Use   Smoking status: Never    Passive exposure: Never   Smokeless tobacco: Never  Vaping Use   Vaping Use: Never used  Substance and Sexual Activity   Alcohol use: Not Currently   Drug use: Never   Sexual activity: Not Currently  Other Topics Concern   Not on file  Social History Narrative   Not on file   Social Determinants of Health   Financial Resource Strain: Not on file  Food Insecurity: Not on file  Transportation Needs: Not on file  Physical Activity: Not on file  Stress: Not on file  Social Connections: Not on file    ECOG Status: 0 - Asymptomatic  Review of Systems  Review of Systems: A 12 point ROS discussed and pertinent positives are indicated in the HPI above.  All other systems are negative.   Physical Exam No direct physical exam was performed (except for noted visual exam findings with Video Visits).    Vital Signs: There were no vitals taken for this visit.  Imaging: CT GUIDE TISSUE ABLATION  Result Date: 11/04/2021 INDICATION:  81 year old female with an enlarging enhancing mass exophytic from the right kidney highly concerning for renal cell carcinoma. She has a poor operative candidate and presents for concurrent image guided biopsy and percutaneous thermal ablation. EXAM: 1. CT-guided core biopsy 2. CT-guided microwave ablation COMPARISON:  CT abdomen and pelvis 09/14/2021 MEDICATIONS: None. ANESTHESIA/SEDATION: General - as administered by the Anesthesia department FLUOROSCOPY: None. COMPLICATIONS: None immediate. TECHNIQUE: Informed written consent was obtained from the patient after a thorough discussion of the procedural risks, benefits and alternatives. All questions were addressed. Maximal Sterile Barrier Technique was utilized including caps, mask, sterile gowns, sterile gloves, sterile drape, hand hygiene and skin antiseptic. A timeout was performed prior to the initiation of the procedure. Planning axial CT imaging was performed. The lesion was successfully visualized and appears to have further enlarged measuring approximately 4.5 x 4.0 cm. Suitable skin entry sites for a 3 probe ablation and concurrent core biopsy were identified and the  skin marked. The skin was then sterilely prepped and draped in the standard fashion using chlorhexidine skin prep. Small dermatotomy is were made. Under intermittent CT guidance, 3 NeuWave 17 gauge 15 cm PR probes were carefully advanced into the lesion in a triangular configuration. Two probes were advanced 1.5 cm apart into the central mass of the lesion where it abuts the renal parenchyma favoring the parenchymal side to account for expected heat sink from the renal parenchyma. The third lesion was placed in the more exophytic component at the inferior aspect of the mass. A 17 gauge trocar needle was also advanced and positioned at the margin of the mass or way from the ablation probes. Once the ablation probes were appropriately positioned. Two core biopsies were obtained through the 17  gauge trocar needle utilizing an 18 gauge bio Pince automated biopsy device. Biopsy specimens were placed in formalin and delivered to pathology for further analysis. Thermal ablation was then performed. All 3 probes were powered at 65 watts and ablation was maintained for 5 minutes. All 3 probes were then pulled back approximately 1.5 cm and ablation continued further at 65 watts for an additional 3 minutes. The ablation site was checked intermittently with CT imaging. Following the initial ablation the probes were removed but reserved and axial CT imaging was performed with and without intravenous contrast. There is no evidence of significant hematoma or active bleeding. No evidence of urine leak. However, there is questionable enhancement at the superior most aspect of the mass where is more endophytic into the renal parenchyma. Therefore, 1 of the PR probes was selected and a new dermatotomy made. The probe was advanced under intermittent CT guidance into the site of potential residual disease. Additional thermal ablation was then performed at 65 watts for 3 minutes. Follow-up CT imaging demonstrates and affective ablation zone. Again, no evidence of complication. The probes were destroyed and disposed of. Band-Aids were applied. FINDINGS: 1. Successful CT-guided core biopsy of right renal mass. 2. Successful CT-guided percutaneous thermal ablation of the renal mass. IMPRESSION: 1. Successful CT-guided core biopsy of right renal mass. 2. Successful CT-guided percutaneous thermal ablation of the renal mass. Electronically Signed   By: Jacqulynn Cadet M.D.   On: 11/04/2021 13:29   CT BIOPSY  Result Date: 11/04/2021 INDICATION: 81 year old female with an enlarging enhancing mass exophytic from the right kidney highly concerning for renal cell carcinoma. She has a poor operative candidate and presents for concurrent image guided biopsy and percutaneous thermal ablation. EXAM: 1. CT-guided core biopsy 2.  CT-guided microwave ablation COMPARISON:  CT abdomen and pelvis 09/14/2021 MEDICATIONS: None. ANESTHESIA/SEDATION: General - as administered by the Anesthesia department FLUOROSCOPY: None. COMPLICATIONS: None immediate. TECHNIQUE: Informed written consent was obtained from the patient after a thorough discussion of the procedural risks, benefits and alternatives. All questions were addressed. Maximal Sterile Barrier Technique was utilized including caps, mask, sterile gowns, sterile gloves, sterile drape, hand hygiene and skin antiseptic. A timeout was performed prior to the initiation of the procedure. Planning axial CT imaging was performed. The lesion was successfully visualized and appears to have further enlarged measuring approximately 4.5 x 4.0 cm. Suitable skin entry sites for a 3 probe ablation and concurrent core biopsy were identified and the skin marked. The skin was then sterilely prepped and draped in the standard fashion using chlorhexidine skin prep. Small dermatotomy is were made. Under intermittent CT guidance, 3 NeuWave 17 gauge 15 cm PR probes were carefully advanced into the lesion in a triangular  configuration. Two probes were advanced 1.5 cm apart into the central mass of the lesion where it abuts the renal parenchyma favoring the parenchymal side to account for expected heat sink from the renal parenchyma. The third lesion was placed in the more exophytic component at the inferior aspect of the mass. A 17 gauge trocar needle was also advanced and positioned at the margin of the mass or way from the ablation probes. Once the ablation probes were appropriately positioned. Two core biopsies were obtained through the 17 gauge trocar needle utilizing an 18 gauge bio Pince automated biopsy device. Biopsy specimens were placed in formalin and delivered to pathology for further analysis. Thermal ablation was then performed. All 3 probes were powered at 65 watts and ablation was maintained for 5  minutes. All 3 probes were then pulled back approximately 1.5 cm and ablation continued further at 65 watts for an additional 3 minutes. The ablation site was checked intermittently with CT imaging. Following the initial ablation the probes were removed but reserved and axial CT imaging was performed with and without intravenous contrast. There is no evidence of significant hematoma or active bleeding. No evidence of urine leak. However, there is questionable enhancement at the superior most aspect of the mass where is more endophytic into the renal parenchyma. Therefore, 1 of the PR probes was selected and a new dermatotomy made. The probe was advanced under intermittent CT guidance into the site of potential residual disease. Additional thermal ablation was then performed at 65 watts for 3 minutes. Follow-up CT imaging demonstrates and affective ablation zone. Again, no evidence of complication. The probes were destroyed and disposed of. Band-Aids were applied. FINDINGS: 1. Successful CT-guided core biopsy of right renal mass. 2. Successful CT-guided percutaneous thermal ablation of the renal mass. IMPRESSION: 1. Successful CT-guided core biopsy of right renal mass. 2. Successful CT-guided percutaneous thermal ablation of the renal mass. Electronically Signed   By: Jacqulynn Cadet M.D.   On: 11/04/2021 13:29   DG Chest 1 View  Result Date: 10/24/2021 CLINICAL DATA:  Renal cell carcinoma, EXAM: CHEST  1 VIEW COMPARISON:  10/17/2014 FINDINGS: Small right pleural effusion has developed. Stable 14 mm nodule within the left perihilar region, safely considered benign given its stability over time. Lungs are otherwise clear. No pneumothorax. Cardiac size within normal limits. Pulmonary vascularity is normal. No acute bone abnormality. IMPRESSION: Interval development of a small right pleural effusion. Stable 14 mm pulmonary nodule within the left perihilar region, safely considered benign. Electronically Signed    By: Fidela Salisbury M.D.   On: 10/24/2021 20:16    Labs:  CBC: Recent Labs    03/09/21 1334 03/12/21 1248 10/22/21 1300  WBC 7.6  --  7.6  HGB 9.1* 9.2* 10.3*  HCT 28.5* 27.0* 31.4*  PLT 221  --  202    COAGS: Recent Labs    03/09/21 1334 10/22/21 1300  INR 1.1 1.1  APTT 29  --     BMP: Recent Labs    03/12/21 1248 11/04/21 0746  NA 137 141  K 4.6 3.4*  CL 108 103  CO2  --  24  GLUCOSE 105* 136*  BUN 125* 90*  CALCIUM  --  9.9  CREATININE 4.20* 4.57*  GFRNONAA  --  9*    LIVER FUNCTION TESTS: No results for input(s): "BILITOT", "AST", "ALT", "ALKPHOS", "PROT", "ALBUMIN" in the last 8760 hours.  TUMOR MARKERS: No results for input(s): "AFPTM", "CEA", "CA199", "CHROMGRNA" in the last 8760 hours.  Assessment and Plan:  Doing well 2 weeks status post percutaneous CT-guided biopsy and microwave ablation of the large right renal mass.  Biopsy results were consistent with low-grade oncocytic neoplasm.  She has recovered to her baseline and is doing well.  We will continue routine surveillance imaging.  1.)  G contrast-enhanced CT scan of the abdomen with clinic visit in 6 months.    Electronically Signed: Criselda Peaches 11/19/2021, 2:57 PM   I spent a total of  10 Minutes in remote  clinical consultation, greater than 50% of which was counseling/coordinating care for right renal oncocytic neoplasm.    Visit type: Audio only (telephone). Audio (no video) only due to patient preference. Alternative for in-person consultation at [facility], [address], [place], [place]. This visit type was conducted due to national recommendations for restrictions regarding the COVID-19 Pandemic (e.g. social distancing).  This format is felt to be most appropriate for this patient at this time.  All issues noted in this document were discussed and addressed.

## 2021-11-20 DIAGNOSIS — N186 End stage renal disease: Secondary | ICD-10-CM | POA: Diagnosis not present

## 2021-11-20 DIAGNOSIS — Z23 Encounter for immunization: Secondary | ICD-10-CM | POA: Diagnosis not present

## 2021-11-20 DIAGNOSIS — Z992 Dependence on renal dialysis: Secondary | ICD-10-CM | POA: Diagnosis not present

## 2021-11-21 DIAGNOSIS — N186 End stage renal disease: Secondary | ICD-10-CM | POA: Diagnosis not present

## 2021-11-21 DIAGNOSIS — Z23 Encounter for immunization: Secondary | ICD-10-CM | POA: Diagnosis not present

## 2021-11-21 DIAGNOSIS — Z992 Dependence on renal dialysis: Secondary | ICD-10-CM | POA: Diagnosis not present

## 2021-11-22 DIAGNOSIS — Z992 Dependence on renal dialysis: Secondary | ICD-10-CM | POA: Diagnosis not present

## 2021-11-22 DIAGNOSIS — Z23 Encounter for immunization: Secondary | ICD-10-CM | POA: Diagnosis not present

## 2021-11-22 DIAGNOSIS — N186 End stage renal disease: Secondary | ICD-10-CM | POA: Diagnosis not present

## 2021-11-23 DIAGNOSIS — N186 End stage renal disease: Secondary | ICD-10-CM | POA: Diagnosis not present

## 2021-11-23 DIAGNOSIS — Z992 Dependence on renal dialysis: Secondary | ICD-10-CM | POA: Diagnosis not present

## 2021-11-23 DIAGNOSIS — Z23 Encounter for immunization: Secondary | ICD-10-CM | POA: Diagnosis not present

## 2021-11-24 DIAGNOSIS — N186 End stage renal disease: Secondary | ICD-10-CM | POA: Diagnosis not present

## 2021-11-24 DIAGNOSIS — Z23 Encounter for immunization: Secondary | ICD-10-CM | POA: Diagnosis not present

## 2021-11-24 DIAGNOSIS — Z992 Dependence on renal dialysis: Secondary | ICD-10-CM | POA: Diagnosis not present

## 2021-11-25 DIAGNOSIS — Z992 Dependence on renal dialysis: Secondary | ICD-10-CM | POA: Diagnosis not present

## 2021-11-25 DIAGNOSIS — Z23 Encounter for immunization: Secondary | ICD-10-CM | POA: Diagnosis not present

## 2021-11-25 DIAGNOSIS — N186 End stage renal disease: Secondary | ICD-10-CM | POA: Diagnosis not present

## 2021-11-26 DIAGNOSIS — N186 End stage renal disease: Secondary | ICD-10-CM | POA: Diagnosis not present

## 2021-11-26 DIAGNOSIS — Z23 Encounter for immunization: Secondary | ICD-10-CM | POA: Diagnosis not present

## 2021-11-26 DIAGNOSIS — Z992 Dependence on renal dialysis: Secondary | ICD-10-CM | POA: Diagnosis not present

## 2021-11-27 DIAGNOSIS — N186 End stage renal disease: Secondary | ICD-10-CM | POA: Diagnosis not present

## 2021-11-27 DIAGNOSIS — Z992 Dependence on renal dialysis: Secondary | ICD-10-CM | POA: Diagnosis not present

## 2021-11-27 DIAGNOSIS — Z23 Encounter for immunization: Secondary | ICD-10-CM | POA: Diagnosis not present

## 2021-11-28 DIAGNOSIS — N186 End stage renal disease: Secondary | ICD-10-CM | POA: Diagnosis not present

## 2021-11-28 DIAGNOSIS — Z23 Encounter for immunization: Secondary | ICD-10-CM | POA: Diagnosis not present

## 2021-11-28 DIAGNOSIS — Z992 Dependence on renal dialysis: Secondary | ICD-10-CM | POA: Diagnosis not present

## 2021-11-29 DIAGNOSIS — N186 End stage renal disease: Secondary | ICD-10-CM | POA: Diagnosis not present

## 2021-11-29 DIAGNOSIS — Z992 Dependence on renal dialysis: Secondary | ICD-10-CM | POA: Diagnosis not present

## 2021-11-29 DIAGNOSIS — Z23 Encounter for immunization: Secondary | ICD-10-CM | POA: Diagnosis not present

## 2021-11-30 DIAGNOSIS — N186 End stage renal disease: Secondary | ICD-10-CM | POA: Diagnosis not present

## 2021-11-30 DIAGNOSIS — Z23 Encounter for immunization: Secondary | ICD-10-CM | POA: Diagnosis not present

## 2021-11-30 DIAGNOSIS — Z992 Dependence on renal dialysis: Secondary | ICD-10-CM | POA: Diagnosis not present

## 2021-12-01 DIAGNOSIS — Z23 Encounter for immunization: Secondary | ICD-10-CM | POA: Diagnosis not present

## 2021-12-01 DIAGNOSIS — Z992 Dependence on renal dialysis: Secondary | ICD-10-CM | POA: Diagnosis not present

## 2021-12-01 DIAGNOSIS — N186 End stage renal disease: Secondary | ICD-10-CM | POA: Diagnosis not present

## 2021-12-02 DIAGNOSIS — N186 End stage renal disease: Secondary | ICD-10-CM | POA: Diagnosis not present

## 2021-12-02 DIAGNOSIS — Z992 Dependence on renal dialysis: Secondary | ICD-10-CM | POA: Diagnosis not present

## 2021-12-03 DIAGNOSIS — N186 End stage renal disease: Secondary | ICD-10-CM | POA: Diagnosis not present

## 2021-12-03 DIAGNOSIS — Z992 Dependence on renal dialysis: Secondary | ICD-10-CM | POA: Diagnosis not present

## 2021-12-04 DIAGNOSIS — N186 End stage renal disease: Secondary | ICD-10-CM | POA: Diagnosis not present

## 2021-12-04 DIAGNOSIS — Z992 Dependence on renal dialysis: Secondary | ICD-10-CM | POA: Diagnosis not present

## 2021-12-05 DIAGNOSIS — N186 End stage renal disease: Secondary | ICD-10-CM | POA: Diagnosis not present

## 2021-12-05 DIAGNOSIS — Z992 Dependence on renal dialysis: Secondary | ICD-10-CM | POA: Diagnosis not present

## 2021-12-06 DIAGNOSIS — N186 End stage renal disease: Secondary | ICD-10-CM | POA: Diagnosis not present

## 2021-12-06 DIAGNOSIS — Z992 Dependence on renal dialysis: Secondary | ICD-10-CM | POA: Diagnosis not present

## 2021-12-07 DIAGNOSIS — Z992 Dependence on renal dialysis: Secondary | ICD-10-CM | POA: Diagnosis not present

## 2021-12-07 DIAGNOSIS — N186 End stage renal disease: Secondary | ICD-10-CM | POA: Diagnosis not present

## 2021-12-08 DIAGNOSIS — N186 End stage renal disease: Secondary | ICD-10-CM | POA: Diagnosis not present

## 2021-12-08 DIAGNOSIS — Z992 Dependence on renal dialysis: Secondary | ICD-10-CM | POA: Diagnosis not present

## 2021-12-09 DIAGNOSIS — N186 End stage renal disease: Secondary | ICD-10-CM | POA: Diagnosis not present

## 2021-12-09 DIAGNOSIS — Z992 Dependence on renal dialysis: Secondary | ICD-10-CM | POA: Diagnosis not present

## 2021-12-10 DIAGNOSIS — N186 End stage renal disease: Secondary | ICD-10-CM | POA: Diagnosis not present

## 2021-12-10 DIAGNOSIS — Z992 Dependence on renal dialysis: Secondary | ICD-10-CM | POA: Diagnosis not present

## 2021-12-11 DIAGNOSIS — Z992 Dependence on renal dialysis: Secondary | ICD-10-CM | POA: Diagnosis not present

## 2021-12-11 DIAGNOSIS — N186 End stage renal disease: Secondary | ICD-10-CM | POA: Diagnosis not present

## 2021-12-12 DIAGNOSIS — Z992 Dependence on renal dialysis: Secondary | ICD-10-CM | POA: Diagnosis not present

## 2021-12-12 DIAGNOSIS — N186 End stage renal disease: Secondary | ICD-10-CM | POA: Diagnosis not present

## 2021-12-13 DIAGNOSIS — Z992 Dependence on renal dialysis: Secondary | ICD-10-CM | POA: Diagnosis not present

## 2021-12-13 DIAGNOSIS — N186 End stage renal disease: Secondary | ICD-10-CM | POA: Diagnosis not present

## 2021-12-14 DIAGNOSIS — N186 End stage renal disease: Secondary | ICD-10-CM | POA: Diagnosis not present

## 2021-12-14 DIAGNOSIS — Z992 Dependence on renal dialysis: Secondary | ICD-10-CM | POA: Diagnosis not present

## 2021-12-15 DIAGNOSIS — N186 End stage renal disease: Secondary | ICD-10-CM | POA: Diagnosis not present

## 2021-12-15 DIAGNOSIS — Z992 Dependence on renal dialysis: Secondary | ICD-10-CM | POA: Diagnosis not present

## 2021-12-16 DIAGNOSIS — N186 End stage renal disease: Secondary | ICD-10-CM | POA: Diagnosis not present

## 2021-12-16 DIAGNOSIS — Z992 Dependence on renal dialysis: Secondary | ICD-10-CM | POA: Diagnosis not present

## 2021-12-17 DIAGNOSIS — N186 End stage renal disease: Secondary | ICD-10-CM | POA: Diagnosis not present

## 2021-12-17 DIAGNOSIS — Z992 Dependence on renal dialysis: Secondary | ICD-10-CM | POA: Diagnosis not present

## 2021-12-18 DIAGNOSIS — Z992 Dependence on renal dialysis: Secondary | ICD-10-CM | POA: Diagnosis not present

## 2021-12-18 DIAGNOSIS — N186 End stage renal disease: Secondary | ICD-10-CM | POA: Diagnosis not present

## 2021-12-19 DIAGNOSIS — N186 End stage renal disease: Secondary | ICD-10-CM | POA: Diagnosis not present

## 2021-12-19 DIAGNOSIS — Z992 Dependence on renal dialysis: Secondary | ICD-10-CM | POA: Diagnosis not present

## 2021-12-20 DIAGNOSIS — N186 End stage renal disease: Secondary | ICD-10-CM | POA: Diagnosis not present

## 2021-12-20 DIAGNOSIS — Z992 Dependence on renal dialysis: Secondary | ICD-10-CM | POA: Diagnosis not present

## 2021-12-21 DIAGNOSIS — Z992 Dependence on renal dialysis: Secondary | ICD-10-CM | POA: Diagnosis not present

## 2021-12-21 DIAGNOSIS — N186 End stage renal disease: Secondary | ICD-10-CM | POA: Diagnosis not present

## 2021-12-22 DIAGNOSIS — N186 End stage renal disease: Secondary | ICD-10-CM | POA: Diagnosis not present

## 2021-12-22 DIAGNOSIS — Z992 Dependence on renal dialysis: Secondary | ICD-10-CM | POA: Diagnosis not present

## 2021-12-23 DIAGNOSIS — Z992 Dependence on renal dialysis: Secondary | ICD-10-CM | POA: Diagnosis not present

## 2021-12-23 DIAGNOSIS — N186 End stage renal disease: Secondary | ICD-10-CM | POA: Diagnosis not present

## 2021-12-24 DIAGNOSIS — Z992 Dependence on renal dialysis: Secondary | ICD-10-CM | POA: Diagnosis not present

## 2021-12-24 DIAGNOSIS — N186 End stage renal disease: Secondary | ICD-10-CM | POA: Diagnosis not present

## 2021-12-25 DIAGNOSIS — Z992 Dependence on renal dialysis: Secondary | ICD-10-CM | POA: Diagnosis not present

## 2021-12-25 DIAGNOSIS — N186 End stage renal disease: Secondary | ICD-10-CM | POA: Diagnosis not present

## 2021-12-26 DIAGNOSIS — Z992 Dependence on renal dialysis: Secondary | ICD-10-CM | POA: Diagnosis not present

## 2021-12-26 DIAGNOSIS — N186 End stage renal disease: Secondary | ICD-10-CM | POA: Diagnosis not present

## 2021-12-27 DIAGNOSIS — N186 End stage renal disease: Secondary | ICD-10-CM | POA: Diagnosis not present

## 2021-12-27 DIAGNOSIS — Z992 Dependence on renal dialysis: Secondary | ICD-10-CM | POA: Diagnosis not present

## 2021-12-28 DIAGNOSIS — Z992 Dependence on renal dialysis: Secondary | ICD-10-CM | POA: Diagnosis not present

## 2021-12-28 DIAGNOSIS — N186 End stage renal disease: Secondary | ICD-10-CM | POA: Diagnosis not present

## 2021-12-29 DIAGNOSIS — Z992 Dependence on renal dialysis: Secondary | ICD-10-CM | POA: Diagnosis not present

## 2021-12-29 DIAGNOSIS — N186 End stage renal disease: Secondary | ICD-10-CM | POA: Diagnosis not present

## 2021-12-30 DIAGNOSIS — Z992 Dependence on renal dialysis: Secondary | ICD-10-CM | POA: Diagnosis not present

## 2021-12-30 DIAGNOSIS — N186 End stage renal disease: Secondary | ICD-10-CM | POA: Diagnosis not present

## 2021-12-31 DIAGNOSIS — N186 End stage renal disease: Secondary | ICD-10-CM | POA: Diagnosis not present

## 2021-12-31 DIAGNOSIS — Z992 Dependence on renal dialysis: Secondary | ICD-10-CM | POA: Diagnosis not present

## 2022-01-01 DIAGNOSIS — Z23 Encounter for immunization: Secondary | ICD-10-CM | POA: Diagnosis not present

## 2022-01-01 DIAGNOSIS — Z992 Dependence on renal dialysis: Secondary | ICD-10-CM | POA: Diagnosis not present

## 2022-01-01 DIAGNOSIS — N186 End stage renal disease: Secondary | ICD-10-CM | POA: Diagnosis not present

## 2022-01-02 DIAGNOSIS — N186 End stage renal disease: Secondary | ICD-10-CM | POA: Diagnosis not present

## 2022-01-02 DIAGNOSIS — Z992 Dependence on renal dialysis: Secondary | ICD-10-CM | POA: Diagnosis not present

## 2022-01-02 DIAGNOSIS — Z23 Encounter for immunization: Secondary | ICD-10-CM | POA: Diagnosis not present

## 2022-01-03 DIAGNOSIS — Z992 Dependence on renal dialysis: Secondary | ICD-10-CM | POA: Diagnosis not present

## 2022-01-03 DIAGNOSIS — Z23 Encounter for immunization: Secondary | ICD-10-CM | POA: Diagnosis not present

## 2022-01-03 DIAGNOSIS — N186 End stage renal disease: Secondary | ICD-10-CM | POA: Diagnosis not present

## 2022-01-04 DIAGNOSIS — Z992 Dependence on renal dialysis: Secondary | ICD-10-CM | POA: Diagnosis not present

## 2022-01-04 DIAGNOSIS — N186 End stage renal disease: Secondary | ICD-10-CM | POA: Diagnosis not present

## 2022-01-04 DIAGNOSIS — Z23 Encounter for immunization: Secondary | ICD-10-CM | POA: Diagnosis not present

## 2022-01-05 DIAGNOSIS — Z23 Encounter for immunization: Secondary | ICD-10-CM | POA: Diagnosis not present

## 2022-01-05 DIAGNOSIS — N186 End stage renal disease: Secondary | ICD-10-CM | POA: Diagnosis not present

## 2022-01-05 DIAGNOSIS — Z992 Dependence on renal dialysis: Secondary | ICD-10-CM | POA: Diagnosis not present

## 2022-01-06 DIAGNOSIS — Z992 Dependence on renal dialysis: Secondary | ICD-10-CM | POA: Diagnosis not present

## 2022-01-06 DIAGNOSIS — N186 End stage renal disease: Secondary | ICD-10-CM | POA: Diagnosis not present

## 2022-01-06 DIAGNOSIS — Z23 Encounter for immunization: Secondary | ICD-10-CM | POA: Diagnosis not present

## 2022-01-07 DIAGNOSIS — N186 End stage renal disease: Secondary | ICD-10-CM | POA: Diagnosis not present

## 2022-01-07 DIAGNOSIS — Z23 Encounter for immunization: Secondary | ICD-10-CM | POA: Diagnosis not present

## 2022-01-07 DIAGNOSIS — Z992 Dependence on renal dialysis: Secondary | ICD-10-CM | POA: Diagnosis not present

## 2022-01-08 DIAGNOSIS — Z23 Encounter for immunization: Secondary | ICD-10-CM | POA: Diagnosis not present

## 2022-01-08 DIAGNOSIS — Z992 Dependence on renal dialysis: Secondary | ICD-10-CM | POA: Diagnosis not present

## 2022-01-08 DIAGNOSIS — N186 End stage renal disease: Secondary | ICD-10-CM | POA: Diagnosis not present

## 2022-01-09 DIAGNOSIS — Z23 Encounter for immunization: Secondary | ICD-10-CM | POA: Diagnosis not present

## 2022-01-09 DIAGNOSIS — Z992 Dependence on renal dialysis: Secondary | ICD-10-CM | POA: Diagnosis not present

## 2022-01-09 DIAGNOSIS — N186 End stage renal disease: Secondary | ICD-10-CM | POA: Diagnosis not present

## 2022-01-10 DIAGNOSIS — Z23 Encounter for immunization: Secondary | ICD-10-CM | POA: Diagnosis not present

## 2022-01-10 DIAGNOSIS — N186 End stage renal disease: Secondary | ICD-10-CM | POA: Diagnosis not present

## 2022-01-10 DIAGNOSIS — Z992 Dependence on renal dialysis: Secondary | ICD-10-CM | POA: Diagnosis not present

## 2022-01-11 DIAGNOSIS — Z23 Encounter for immunization: Secondary | ICD-10-CM | POA: Diagnosis not present

## 2022-01-11 DIAGNOSIS — N186 End stage renal disease: Secondary | ICD-10-CM | POA: Diagnosis not present

## 2022-01-11 DIAGNOSIS — Z992 Dependence on renal dialysis: Secondary | ICD-10-CM | POA: Diagnosis not present

## 2022-01-12 DIAGNOSIS — N186 End stage renal disease: Secondary | ICD-10-CM | POA: Diagnosis not present

## 2022-01-12 DIAGNOSIS — Z23 Encounter for immunization: Secondary | ICD-10-CM | POA: Diagnosis not present

## 2022-01-12 DIAGNOSIS — Z992 Dependence on renal dialysis: Secondary | ICD-10-CM | POA: Diagnosis not present

## 2022-01-13 DIAGNOSIS — N186 End stage renal disease: Secondary | ICD-10-CM | POA: Diagnosis not present

## 2022-01-13 DIAGNOSIS — Z992 Dependence on renal dialysis: Secondary | ICD-10-CM | POA: Diagnosis not present

## 2022-01-13 DIAGNOSIS — Z23 Encounter for immunization: Secondary | ICD-10-CM | POA: Diagnosis not present

## 2022-01-14 DIAGNOSIS — N186 End stage renal disease: Secondary | ICD-10-CM | POA: Diagnosis not present

## 2022-01-14 DIAGNOSIS — Z23 Encounter for immunization: Secondary | ICD-10-CM | POA: Diagnosis not present

## 2022-01-14 DIAGNOSIS — Z992 Dependence on renal dialysis: Secondary | ICD-10-CM | POA: Diagnosis not present

## 2022-01-15 DIAGNOSIS — Z23 Encounter for immunization: Secondary | ICD-10-CM | POA: Diagnosis not present

## 2022-01-15 DIAGNOSIS — N186 End stage renal disease: Secondary | ICD-10-CM | POA: Diagnosis not present

## 2022-01-15 DIAGNOSIS — Z992 Dependence on renal dialysis: Secondary | ICD-10-CM | POA: Diagnosis not present

## 2022-01-16 DIAGNOSIS — N186 End stage renal disease: Secondary | ICD-10-CM | POA: Diagnosis not present

## 2022-01-16 DIAGNOSIS — Z23 Encounter for immunization: Secondary | ICD-10-CM | POA: Diagnosis not present

## 2022-01-16 DIAGNOSIS — Z992 Dependence on renal dialysis: Secondary | ICD-10-CM | POA: Diagnosis not present

## 2022-01-17 DIAGNOSIS — N186 End stage renal disease: Secondary | ICD-10-CM | POA: Diagnosis not present

## 2022-01-17 DIAGNOSIS — Z992 Dependence on renal dialysis: Secondary | ICD-10-CM | POA: Diagnosis not present

## 2022-01-17 DIAGNOSIS — Z23 Encounter for immunization: Secondary | ICD-10-CM | POA: Diagnosis not present

## 2022-01-18 DIAGNOSIS — Z992 Dependence on renal dialysis: Secondary | ICD-10-CM | POA: Diagnosis not present

## 2022-01-18 DIAGNOSIS — N186 End stage renal disease: Secondary | ICD-10-CM | POA: Diagnosis not present

## 2022-01-18 DIAGNOSIS — Z23 Encounter for immunization: Secondary | ICD-10-CM | POA: Diagnosis not present

## 2022-01-19 DIAGNOSIS — Z23 Encounter for immunization: Secondary | ICD-10-CM | POA: Diagnosis not present

## 2022-01-19 DIAGNOSIS — N186 End stage renal disease: Secondary | ICD-10-CM | POA: Diagnosis not present

## 2022-01-19 DIAGNOSIS — Z992 Dependence on renal dialysis: Secondary | ICD-10-CM | POA: Diagnosis not present

## 2022-01-20 DIAGNOSIS — Z23 Encounter for immunization: Secondary | ICD-10-CM | POA: Diagnosis not present

## 2022-01-20 DIAGNOSIS — Z992 Dependence on renal dialysis: Secondary | ICD-10-CM | POA: Diagnosis not present

## 2022-01-20 DIAGNOSIS — N186 End stage renal disease: Secondary | ICD-10-CM | POA: Diagnosis not present

## 2022-01-21 DIAGNOSIS — N186 End stage renal disease: Secondary | ICD-10-CM | POA: Diagnosis not present

## 2022-01-21 DIAGNOSIS — Z992 Dependence on renal dialysis: Secondary | ICD-10-CM | POA: Diagnosis not present

## 2022-01-21 DIAGNOSIS — Z23 Encounter for immunization: Secondary | ICD-10-CM | POA: Diagnosis not present

## 2022-01-22 DIAGNOSIS — Z992 Dependence on renal dialysis: Secondary | ICD-10-CM | POA: Diagnosis not present

## 2022-01-22 DIAGNOSIS — Z23 Encounter for immunization: Secondary | ICD-10-CM | POA: Diagnosis not present

## 2022-01-22 DIAGNOSIS — N186 End stage renal disease: Secondary | ICD-10-CM | POA: Diagnosis not present

## 2022-01-23 DIAGNOSIS — Z23 Encounter for immunization: Secondary | ICD-10-CM | POA: Diagnosis not present

## 2022-01-23 DIAGNOSIS — Z992 Dependence on renal dialysis: Secondary | ICD-10-CM | POA: Diagnosis not present

## 2022-01-23 DIAGNOSIS — N186 End stage renal disease: Secondary | ICD-10-CM | POA: Diagnosis not present

## 2022-01-24 DIAGNOSIS — Z992 Dependence on renal dialysis: Secondary | ICD-10-CM | POA: Diagnosis not present

## 2022-01-24 DIAGNOSIS — N186 End stage renal disease: Secondary | ICD-10-CM | POA: Diagnosis not present

## 2022-01-24 DIAGNOSIS — Z23 Encounter for immunization: Secondary | ICD-10-CM | POA: Diagnosis not present

## 2022-01-25 DIAGNOSIS — N186 End stage renal disease: Secondary | ICD-10-CM | POA: Diagnosis not present

## 2022-01-25 DIAGNOSIS — Z23 Encounter for immunization: Secondary | ICD-10-CM | POA: Diagnosis not present

## 2022-01-25 DIAGNOSIS — Z992 Dependence on renal dialysis: Secondary | ICD-10-CM | POA: Diagnosis not present

## 2022-01-26 DIAGNOSIS — Z23 Encounter for immunization: Secondary | ICD-10-CM | POA: Diagnosis not present

## 2022-01-26 DIAGNOSIS — Z992 Dependence on renal dialysis: Secondary | ICD-10-CM | POA: Diagnosis not present

## 2022-01-26 DIAGNOSIS — N186 End stage renal disease: Secondary | ICD-10-CM | POA: Diagnosis not present

## 2022-01-27 DIAGNOSIS — Z23 Encounter for immunization: Secondary | ICD-10-CM | POA: Diagnosis not present

## 2022-01-27 DIAGNOSIS — N186 End stage renal disease: Secondary | ICD-10-CM | POA: Diagnosis not present

## 2022-01-27 DIAGNOSIS — Z992 Dependence on renal dialysis: Secondary | ICD-10-CM | POA: Diagnosis not present

## 2022-01-28 DIAGNOSIS — Z992 Dependence on renal dialysis: Secondary | ICD-10-CM | POA: Diagnosis not present

## 2022-01-28 DIAGNOSIS — Z23 Encounter for immunization: Secondary | ICD-10-CM | POA: Diagnosis not present

## 2022-01-28 DIAGNOSIS — N186 End stage renal disease: Secondary | ICD-10-CM | POA: Diagnosis not present

## 2022-01-29 DIAGNOSIS — Z992 Dependence on renal dialysis: Secondary | ICD-10-CM | POA: Diagnosis not present

## 2022-01-29 DIAGNOSIS — Z23 Encounter for immunization: Secondary | ICD-10-CM | POA: Diagnosis not present

## 2022-01-29 DIAGNOSIS — N186 End stage renal disease: Secondary | ICD-10-CM | POA: Diagnosis not present

## 2022-01-30 DIAGNOSIS — N186 End stage renal disease: Secondary | ICD-10-CM | POA: Diagnosis not present

## 2022-01-30 DIAGNOSIS — Z992 Dependence on renal dialysis: Secondary | ICD-10-CM | POA: Diagnosis not present

## 2022-01-30 DIAGNOSIS — Z23 Encounter for immunization: Secondary | ICD-10-CM | POA: Diagnosis not present

## 2022-01-31 DIAGNOSIS — N186 End stage renal disease: Secondary | ICD-10-CM | POA: Diagnosis not present

## 2022-01-31 DIAGNOSIS — Z992 Dependence on renal dialysis: Secondary | ICD-10-CM | POA: Diagnosis not present

## 2022-01-31 DIAGNOSIS — Z23 Encounter for immunization: Secondary | ICD-10-CM | POA: Diagnosis not present

## 2022-02-01 DIAGNOSIS — Z992 Dependence on renal dialysis: Secondary | ICD-10-CM | POA: Diagnosis not present

## 2022-02-01 DIAGNOSIS — N186 End stage renal disease: Secondary | ICD-10-CM | POA: Diagnosis not present

## 2022-02-02 DIAGNOSIS — N186 End stage renal disease: Secondary | ICD-10-CM | POA: Diagnosis not present

## 2022-02-02 DIAGNOSIS — Z992 Dependence on renal dialysis: Secondary | ICD-10-CM | POA: Diagnosis not present

## 2022-02-03 DIAGNOSIS — N186 End stage renal disease: Secondary | ICD-10-CM | POA: Diagnosis not present

## 2022-02-03 DIAGNOSIS — Z992 Dependence on renal dialysis: Secondary | ICD-10-CM | POA: Diagnosis not present

## 2022-02-04 DIAGNOSIS — Z992 Dependence on renal dialysis: Secondary | ICD-10-CM | POA: Diagnosis not present

## 2022-02-04 DIAGNOSIS — N186 End stage renal disease: Secondary | ICD-10-CM | POA: Diagnosis not present

## 2022-02-05 DIAGNOSIS — Z992 Dependence on renal dialysis: Secondary | ICD-10-CM | POA: Diagnosis not present

## 2022-02-05 DIAGNOSIS — N186 End stage renal disease: Secondary | ICD-10-CM | POA: Diagnosis not present

## 2022-02-06 DIAGNOSIS — N186 End stage renal disease: Secondary | ICD-10-CM | POA: Diagnosis not present

## 2022-02-06 DIAGNOSIS — Z992 Dependence on renal dialysis: Secondary | ICD-10-CM | POA: Diagnosis not present

## 2022-02-07 DIAGNOSIS — Z992 Dependence on renal dialysis: Secondary | ICD-10-CM | POA: Diagnosis not present

## 2022-02-07 DIAGNOSIS — N186 End stage renal disease: Secondary | ICD-10-CM | POA: Diagnosis not present

## 2022-02-08 DIAGNOSIS — N186 End stage renal disease: Secondary | ICD-10-CM | POA: Diagnosis not present

## 2022-02-08 DIAGNOSIS — Z992 Dependence on renal dialysis: Secondary | ICD-10-CM | POA: Diagnosis not present

## 2022-02-09 DIAGNOSIS — Z992 Dependence on renal dialysis: Secondary | ICD-10-CM | POA: Diagnosis not present

## 2022-02-09 DIAGNOSIS — N186 End stage renal disease: Secondary | ICD-10-CM | POA: Diagnosis not present

## 2022-02-10 DIAGNOSIS — Z992 Dependence on renal dialysis: Secondary | ICD-10-CM | POA: Diagnosis not present

## 2022-02-10 DIAGNOSIS — N186 End stage renal disease: Secondary | ICD-10-CM | POA: Diagnosis not present

## 2022-02-11 DIAGNOSIS — N186 End stage renal disease: Secondary | ICD-10-CM | POA: Diagnosis not present

## 2022-02-11 DIAGNOSIS — Z992 Dependence on renal dialysis: Secondary | ICD-10-CM | POA: Diagnosis not present

## 2022-02-12 DIAGNOSIS — N186 End stage renal disease: Secondary | ICD-10-CM | POA: Diagnosis not present

## 2022-02-12 DIAGNOSIS — Z992 Dependence on renal dialysis: Secondary | ICD-10-CM | POA: Diagnosis not present

## 2022-02-13 DIAGNOSIS — N186 End stage renal disease: Secondary | ICD-10-CM | POA: Diagnosis not present

## 2022-02-13 DIAGNOSIS — Z992 Dependence on renal dialysis: Secondary | ICD-10-CM | POA: Diagnosis not present

## 2022-02-14 DIAGNOSIS — N186 End stage renal disease: Secondary | ICD-10-CM | POA: Diagnosis not present

## 2022-02-14 DIAGNOSIS — Z992 Dependence on renal dialysis: Secondary | ICD-10-CM | POA: Diagnosis not present

## 2022-02-15 DIAGNOSIS — Z992 Dependence on renal dialysis: Secondary | ICD-10-CM | POA: Diagnosis not present

## 2022-02-15 DIAGNOSIS — N186 End stage renal disease: Secondary | ICD-10-CM | POA: Diagnosis not present

## 2022-02-16 DIAGNOSIS — N186 End stage renal disease: Secondary | ICD-10-CM | POA: Diagnosis not present

## 2022-02-16 DIAGNOSIS — Z992 Dependence on renal dialysis: Secondary | ICD-10-CM | POA: Diagnosis not present

## 2022-02-17 DIAGNOSIS — Z992 Dependence on renal dialysis: Secondary | ICD-10-CM | POA: Diagnosis not present

## 2022-02-17 DIAGNOSIS — N186 End stage renal disease: Secondary | ICD-10-CM | POA: Diagnosis not present

## 2022-02-18 DIAGNOSIS — Z992 Dependence on renal dialysis: Secondary | ICD-10-CM | POA: Diagnosis not present

## 2022-02-18 DIAGNOSIS — N186 End stage renal disease: Secondary | ICD-10-CM | POA: Diagnosis not present

## 2022-02-19 DIAGNOSIS — N2581 Secondary hyperparathyroidism of renal origin: Secondary | ICD-10-CM | POA: Diagnosis not present

## 2022-02-19 DIAGNOSIS — Z6839 Body mass index (BMI) 39.0-39.9, adult: Secondary | ICD-10-CM | POA: Diagnosis not present

## 2022-02-19 DIAGNOSIS — I951 Orthostatic hypotension: Secondary | ICD-10-CM | POA: Diagnosis not present

## 2022-02-19 DIAGNOSIS — Z992 Dependence on renal dialysis: Secondary | ICD-10-CM | POA: Diagnosis not present

## 2022-02-19 DIAGNOSIS — I120 Hypertensive chronic kidney disease with stage 5 chronic kidney disease or end stage renal disease: Secondary | ICD-10-CM | POA: Diagnosis not present

## 2022-02-19 DIAGNOSIS — M1990 Unspecified osteoarthritis, unspecified site: Secondary | ICD-10-CM | POA: Diagnosis not present

## 2022-02-19 DIAGNOSIS — K5900 Constipation, unspecified: Secondary | ICD-10-CM | POA: Diagnosis not present

## 2022-02-19 DIAGNOSIS — N186 End stage renal disease: Secondary | ICD-10-CM | POA: Diagnosis not present

## 2022-02-19 DIAGNOSIS — M109 Gout, unspecified: Secondary | ICD-10-CM | POA: Diagnosis not present

## 2022-02-19 DIAGNOSIS — M1030 Gout due to renal impairment, unspecified site: Secondary | ICD-10-CM | POA: Diagnosis not present

## 2022-02-20 DIAGNOSIS — N186 End stage renal disease: Secondary | ICD-10-CM | POA: Diagnosis not present

## 2022-02-20 DIAGNOSIS — Z992 Dependence on renal dialysis: Secondary | ICD-10-CM | POA: Diagnosis not present

## 2022-02-21 DIAGNOSIS — N186 End stage renal disease: Secondary | ICD-10-CM | POA: Diagnosis not present

## 2022-02-21 DIAGNOSIS — Z992 Dependence on renal dialysis: Secondary | ICD-10-CM | POA: Diagnosis not present

## 2022-02-22 DIAGNOSIS — Z992 Dependence on renal dialysis: Secondary | ICD-10-CM | POA: Diagnosis not present

## 2022-02-22 DIAGNOSIS — N186 End stage renal disease: Secondary | ICD-10-CM | POA: Diagnosis not present

## 2022-02-23 DIAGNOSIS — Z992 Dependence on renal dialysis: Secondary | ICD-10-CM | POA: Diagnosis not present

## 2022-02-23 DIAGNOSIS — N186 End stage renal disease: Secondary | ICD-10-CM | POA: Diagnosis not present

## 2022-02-24 DIAGNOSIS — E119 Type 2 diabetes mellitus without complications: Secondary | ICD-10-CM | POA: Diagnosis not present

## 2022-02-24 DIAGNOSIS — N186 End stage renal disease: Secondary | ICD-10-CM | POA: Diagnosis not present

## 2022-02-24 DIAGNOSIS — Z794 Long term (current) use of insulin: Secondary | ICD-10-CM | POA: Diagnosis not present

## 2022-02-24 DIAGNOSIS — Z992 Dependence on renal dialysis: Secondary | ICD-10-CM | POA: Diagnosis not present

## 2022-02-25 DIAGNOSIS — N186 End stage renal disease: Secondary | ICD-10-CM | POA: Diagnosis not present

## 2022-02-25 DIAGNOSIS — Z992 Dependence on renal dialysis: Secondary | ICD-10-CM | POA: Diagnosis not present

## 2022-02-26 DIAGNOSIS — H353131 Nonexudative age-related macular degeneration, bilateral, early dry stage: Secondary | ICD-10-CM | POA: Diagnosis not present

## 2022-02-26 DIAGNOSIS — H18523 Epithelial (juvenile) corneal dystrophy, bilateral: Secondary | ICD-10-CM | POA: Diagnosis not present

## 2022-02-26 DIAGNOSIS — Z961 Presence of intraocular lens: Secondary | ICD-10-CM | POA: Diagnosis not present

## 2022-02-26 DIAGNOSIS — Z992 Dependence on renal dialysis: Secondary | ICD-10-CM | POA: Diagnosis not present

## 2022-02-26 DIAGNOSIS — Z0100 Encounter for examination of eyes and vision without abnormal findings: Secondary | ICD-10-CM | POA: Diagnosis not present

## 2022-02-26 DIAGNOSIS — N186 End stage renal disease: Secondary | ICD-10-CM | POA: Diagnosis not present

## 2022-02-27 DIAGNOSIS — Z992 Dependence on renal dialysis: Secondary | ICD-10-CM | POA: Diagnosis not present

## 2022-02-27 DIAGNOSIS — N186 End stage renal disease: Secondary | ICD-10-CM | POA: Diagnosis not present

## 2022-02-28 DIAGNOSIS — N186 End stage renal disease: Secondary | ICD-10-CM | POA: Diagnosis not present

## 2022-02-28 DIAGNOSIS — Z992 Dependence on renal dialysis: Secondary | ICD-10-CM | POA: Diagnosis not present

## 2022-03-01 DIAGNOSIS — N186 End stage renal disease: Secondary | ICD-10-CM | POA: Diagnosis not present

## 2022-03-01 DIAGNOSIS — Z992 Dependence on renal dialysis: Secondary | ICD-10-CM | POA: Diagnosis not present

## 2022-03-02 DIAGNOSIS — Z992 Dependence on renal dialysis: Secondary | ICD-10-CM | POA: Diagnosis not present

## 2022-03-02 DIAGNOSIS — N186 End stage renal disease: Secondary | ICD-10-CM | POA: Diagnosis not present

## 2022-03-03 DIAGNOSIS — Z992 Dependence on renal dialysis: Secondary | ICD-10-CM | POA: Diagnosis not present

## 2022-03-03 DIAGNOSIS — N186 End stage renal disease: Secondary | ICD-10-CM | POA: Diagnosis not present

## 2022-03-04 DIAGNOSIS — N186 End stage renal disease: Secondary | ICD-10-CM | POA: Diagnosis not present

## 2022-03-04 DIAGNOSIS — Z992 Dependence on renal dialysis: Secondary | ICD-10-CM | POA: Diagnosis not present

## 2022-03-05 DIAGNOSIS — N186 End stage renal disease: Secondary | ICD-10-CM | POA: Diagnosis not present

## 2022-03-05 DIAGNOSIS — Z992 Dependence on renal dialysis: Secondary | ICD-10-CM | POA: Diagnosis not present

## 2022-03-06 DIAGNOSIS — N186 End stage renal disease: Secondary | ICD-10-CM | POA: Diagnosis not present

## 2022-03-06 DIAGNOSIS — Z992 Dependence on renal dialysis: Secondary | ICD-10-CM | POA: Diagnosis not present

## 2022-03-07 DIAGNOSIS — Z992 Dependence on renal dialysis: Secondary | ICD-10-CM | POA: Diagnosis not present

## 2022-03-07 DIAGNOSIS — N186 End stage renal disease: Secondary | ICD-10-CM | POA: Diagnosis not present

## 2022-03-08 DIAGNOSIS — N186 End stage renal disease: Secondary | ICD-10-CM | POA: Diagnosis not present

## 2022-03-08 DIAGNOSIS — Z992 Dependence on renal dialysis: Secondary | ICD-10-CM | POA: Diagnosis not present

## 2022-03-09 DIAGNOSIS — N186 End stage renal disease: Secondary | ICD-10-CM | POA: Diagnosis not present

## 2022-03-09 DIAGNOSIS — Z992 Dependence on renal dialysis: Secondary | ICD-10-CM | POA: Diagnosis not present

## 2022-03-10 DIAGNOSIS — N186 End stage renal disease: Secondary | ICD-10-CM | POA: Diagnosis not present

## 2022-03-10 DIAGNOSIS — Z992 Dependence on renal dialysis: Secondary | ICD-10-CM | POA: Diagnosis not present

## 2022-03-11 DIAGNOSIS — Z992 Dependence on renal dialysis: Secondary | ICD-10-CM | POA: Diagnosis not present

## 2022-03-11 DIAGNOSIS — N186 End stage renal disease: Secondary | ICD-10-CM | POA: Diagnosis not present

## 2022-03-12 DIAGNOSIS — Z992 Dependence on renal dialysis: Secondary | ICD-10-CM | POA: Diagnosis not present

## 2022-03-12 DIAGNOSIS — N186 End stage renal disease: Secondary | ICD-10-CM | POA: Diagnosis not present

## 2022-03-13 DIAGNOSIS — Z992 Dependence on renal dialysis: Secondary | ICD-10-CM | POA: Diagnosis not present

## 2022-03-13 DIAGNOSIS — N186 End stage renal disease: Secondary | ICD-10-CM | POA: Diagnosis not present

## 2022-03-14 DIAGNOSIS — Z992 Dependence on renal dialysis: Secondary | ICD-10-CM | POA: Diagnosis not present

## 2022-03-14 DIAGNOSIS — N186 End stage renal disease: Secondary | ICD-10-CM | POA: Diagnosis not present

## 2022-03-15 DIAGNOSIS — N186 End stage renal disease: Secondary | ICD-10-CM | POA: Diagnosis not present

## 2022-03-15 DIAGNOSIS — Z992 Dependence on renal dialysis: Secondary | ICD-10-CM | POA: Diagnosis not present

## 2022-03-16 DIAGNOSIS — Z992 Dependence on renal dialysis: Secondary | ICD-10-CM | POA: Diagnosis not present

## 2022-03-16 DIAGNOSIS — N186 End stage renal disease: Secondary | ICD-10-CM | POA: Diagnosis not present

## 2022-03-17 DIAGNOSIS — N186 End stage renal disease: Secondary | ICD-10-CM | POA: Diagnosis not present

## 2022-03-17 DIAGNOSIS — Z992 Dependence on renal dialysis: Secondary | ICD-10-CM | POA: Diagnosis not present

## 2022-03-18 DIAGNOSIS — Z992 Dependence on renal dialysis: Secondary | ICD-10-CM | POA: Diagnosis not present

## 2022-03-18 DIAGNOSIS — N186 End stage renal disease: Secondary | ICD-10-CM | POA: Diagnosis not present

## 2022-03-19 DIAGNOSIS — N186 End stage renal disease: Secondary | ICD-10-CM | POA: Diagnosis not present

## 2022-03-19 DIAGNOSIS — Z992 Dependence on renal dialysis: Secondary | ICD-10-CM | POA: Diagnosis not present

## 2022-03-20 DIAGNOSIS — Z992 Dependence on renal dialysis: Secondary | ICD-10-CM | POA: Diagnosis not present

## 2022-03-20 DIAGNOSIS — N186 End stage renal disease: Secondary | ICD-10-CM | POA: Diagnosis not present

## 2022-03-21 DIAGNOSIS — N186 End stage renal disease: Secondary | ICD-10-CM | POA: Diagnosis not present

## 2022-03-21 DIAGNOSIS — Z992 Dependence on renal dialysis: Secondary | ICD-10-CM | POA: Diagnosis not present

## 2022-03-22 DIAGNOSIS — Z992 Dependence on renal dialysis: Secondary | ICD-10-CM | POA: Diagnosis not present

## 2022-03-22 DIAGNOSIS — N186 End stage renal disease: Secondary | ICD-10-CM | POA: Diagnosis not present

## 2022-03-23 DIAGNOSIS — Z992 Dependence on renal dialysis: Secondary | ICD-10-CM | POA: Diagnosis not present

## 2022-03-23 DIAGNOSIS — N186 End stage renal disease: Secondary | ICD-10-CM | POA: Diagnosis not present

## 2022-03-24 DIAGNOSIS — Z992 Dependence on renal dialysis: Secondary | ICD-10-CM | POA: Diagnosis not present

## 2022-03-24 DIAGNOSIS — N186 End stage renal disease: Secondary | ICD-10-CM | POA: Diagnosis not present

## 2022-03-25 DIAGNOSIS — Z992 Dependence on renal dialysis: Secondary | ICD-10-CM | POA: Diagnosis not present

## 2022-03-25 DIAGNOSIS — N186 End stage renal disease: Secondary | ICD-10-CM | POA: Diagnosis not present

## 2022-03-26 DIAGNOSIS — Z992 Dependence on renal dialysis: Secondary | ICD-10-CM | POA: Diagnosis not present

## 2022-03-26 DIAGNOSIS — N186 End stage renal disease: Secondary | ICD-10-CM | POA: Diagnosis not present

## 2022-03-27 DIAGNOSIS — Z992 Dependence on renal dialysis: Secondary | ICD-10-CM | POA: Diagnosis not present

## 2022-03-27 DIAGNOSIS — N186 End stage renal disease: Secondary | ICD-10-CM | POA: Diagnosis not present

## 2022-03-28 DIAGNOSIS — Z992 Dependence on renal dialysis: Secondary | ICD-10-CM | POA: Diagnosis not present

## 2022-03-28 DIAGNOSIS — N186 End stage renal disease: Secondary | ICD-10-CM | POA: Diagnosis not present

## 2022-03-29 DIAGNOSIS — Z992 Dependence on renal dialysis: Secondary | ICD-10-CM | POA: Diagnosis not present

## 2022-03-29 DIAGNOSIS — N186 End stage renal disease: Secondary | ICD-10-CM | POA: Diagnosis not present

## 2022-03-30 DIAGNOSIS — Z992 Dependence on renal dialysis: Secondary | ICD-10-CM | POA: Diagnosis not present

## 2022-03-30 DIAGNOSIS — N186 End stage renal disease: Secondary | ICD-10-CM | POA: Diagnosis not present

## 2022-03-31 DIAGNOSIS — Z992 Dependence on renal dialysis: Secondary | ICD-10-CM | POA: Diagnosis not present

## 2022-03-31 DIAGNOSIS — N186 End stage renal disease: Secondary | ICD-10-CM | POA: Diagnosis not present

## 2022-04-01 DIAGNOSIS — N186 End stage renal disease: Secondary | ICD-10-CM | POA: Diagnosis not present

## 2022-04-01 DIAGNOSIS — Z992 Dependence on renal dialysis: Secondary | ICD-10-CM | POA: Diagnosis not present

## 2022-04-02 DIAGNOSIS — Z992 Dependence on renal dialysis: Secondary | ICD-10-CM | POA: Diagnosis not present

## 2022-04-02 DIAGNOSIS — N186 End stage renal disease: Secondary | ICD-10-CM | POA: Diagnosis not present

## 2022-04-03 DIAGNOSIS — N186 End stage renal disease: Secondary | ICD-10-CM | POA: Diagnosis not present

## 2022-04-03 DIAGNOSIS — Z992 Dependence on renal dialysis: Secondary | ICD-10-CM | POA: Diagnosis not present

## 2022-04-04 DIAGNOSIS — N186 End stage renal disease: Secondary | ICD-10-CM | POA: Diagnosis not present

## 2022-04-04 DIAGNOSIS — Z992 Dependence on renal dialysis: Secondary | ICD-10-CM | POA: Diagnosis not present

## 2022-04-05 DIAGNOSIS — Z992 Dependence on renal dialysis: Secondary | ICD-10-CM | POA: Diagnosis not present

## 2022-04-05 DIAGNOSIS — N186 End stage renal disease: Secondary | ICD-10-CM | POA: Diagnosis not present

## 2022-04-06 DIAGNOSIS — Z992 Dependence on renal dialysis: Secondary | ICD-10-CM | POA: Diagnosis not present

## 2022-04-06 DIAGNOSIS — N186 End stage renal disease: Secondary | ICD-10-CM | POA: Diagnosis not present

## 2022-04-07 DIAGNOSIS — N186 End stage renal disease: Secondary | ICD-10-CM | POA: Diagnosis not present

## 2022-04-07 DIAGNOSIS — Z992 Dependence on renal dialysis: Secondary | ICD-10-CM | POA: Diagnosis not present

## 2022-04-08 DIAGNOSIS — Z992 Dependence on renal dialysis: Secondary | ICD-10-CM | POA: Diagnosis not present

## 2022-04-08 DIAGNOSIS — N186 End stage renal disease: Secondary | ICD-10-CM | POA: Diagnosis not present

## 2022-04-09 DIAGNOSIS — N186 End stage renal disease: Secondary | ICD-10-CM | POA: Diagnosis not present

## 2022-04-09 DIAGNOSIS — Z992 Dependence on renal dialysis: Secondary | ICD-10-CM | POA: Diagnosis not present

## 2022-04-10 DIAGNOSIS — N186 End stage renal disease: Secondary | ICD-10-CM | POA: Diagnosis not present

## 2022-04-10 DIAGNOSIS — Z992 Dependence on renal dialysis: Secondary | ICD-10-CM | POA: Diagnosis not present

## 2022-04-11 DIAGNOSIS — Z992 Dependence on renal dialysis: Secondary | ICD-10-CM | POA: Diagnosis not present

## 2022-04-11 DIAGNOSIS — N186 End stage renal disease: Secondary | ICD-10-CM | POA: Diagnosis not present

## 2022-04-12 DIAGNOSIS — N186 End stage renal disease: Secondary | ICD-10-CM | POA: Diagnosis not present

## 2022-04-12 DIAGNOSIS — Z992 Dependence on renal dialysis: Secondary | ICD-10-CM | POA: Diagnosis not present

## 2022-04-13 DIAGNOSIS — Z992 Dependence on renal dialysis: Secondary | ICD-10-CM | POA: Diagnosis not present

## 2022-04-13 DIAGNOSIS — N186 End stage renal disease: Secondary | ICD-10-CM | POA: Diagnosis not present

## 2022-04-14 DIAGNOSIS — Z992 Dependence on renal dialysis: Secondary | ICD-10-CM | POA: Diagnosis not present

## 2022-04-14 DIAGNOSIS — N186 End stage renal disease: Secondary | ICD-10-CM | POA: Diagnosis not present

## 2022-04-15 DIAGNOSIS — N186 End stage renal disease: Secondary | ICD-10-CM | POA: Diagnosis not present

## 2022-04-15 DIAGNOSIS — Z992 Dependence on renal dialysis: Secondary | ICD-10-CM | POA: Diagnosis not present

## 2022-04-16 DIAGNOSIS — N186 End stage renal disease: Secondary | ICD-10-CM | POA: Diagnosis not present

## 2022-04-16 DIAGNOSIS — Z992 Dependence on renal dialysis: Secondary | ICD-10-CM | POA: Diagnosis not present

## 2022-04-17 DIAGNOSIS — Z992 Dependence on renal dialysis: Secondary | ICD-10-CM | POA: Diagnosis not present

## 2022-04-17 DIAGNOSIS — N186 End stage renal disease: Secondary | ICD-10-CM | POA: Diagnosis not present

## 2022-04-18 DIAGNOSIS — N186 End stage renal disease: Secondary | ICD-10-CM | POA: Diagnosis not present

## 2022-04-18 DIAGNOSIS — Z992 Dependence on renal dialysis: Secondary | ICD-10-CM | POA: Diagnosis not present

## 2022-04-19 DIAGNOSIS — Z992 Dependence on renal dialysis: Secondary | ICD-10-CM | POA: Diagnosis not present

## 2022-04-19 DIAGNOSIS — N186 End stage renal disease: Secondary | ICD-10-CM | POA: Diagnosis not present

## 2022-04-20 DIAGNOSIS — Z992 Dependence on renal dialysis: Secondary | ICD-10-CM | POA: Diagnosis not present

## 2022-04-20 DIAGNOSIS — N186 End stage renal disease: Secondary | ICD-10-CM | POA: Diagnosis not present

## 2022-04-21 DIAGNOSIS — Z992 Dependence on renal dialysis: Secondary | ICD-10-CM | POA: Diagnosis not present

## 2022-04-21 DIAGNOSIS — N186 End stage renal disease: Secondary | ICD-10-CM | POA: Diagnosis not present

## 2022-04-22 DIAGNOSIS — N186 End stage renal disease: Secondary | ICD-10-CM | POA: Diagnosis not present

## 2022-04-22 DIAGNOSIS — Z992 Dependence on renal dialysis: Secondary | ICD-10-CM | POA: Diagnosis not present

## 2022-04-23 DIAGNOSIS — Z992 Dependence on renal dialysis: Secondary | ICD-10-CM | POA: Diagnosis not present

## 2022-04-23 DIAGNOSIS — N186 End stage renal disease: Secondary | ICD-10-CM | POA: Diagnosis not present

## 2022-04-24 DIAGNOSIS — Z992 Dependence on renal dialysis: Secondary | ICD-10-CM | POA: Diagnosis not present

## 2022-04-24 DIAGNOSIS — N186 End stage renal disease: Secondary | ICD-10-CM | POA: Diagnosis not present

## 2022-04-25 DIAGNOSIS — Z992 Dependence on renal dialysis: Secondary | ICD-10-CM | POA: Diagnosis not present

## 2022-04-25 DIAGNOSIS — N186 End stage renal disease: Secondary | ICD-10-CM | POA: Diagnosis not present

## 2022-04-26 ENCOUNTER — Ambulatory Visit
Admission: RE | Admit: 2022-04-26 | Discharge: 2022-04-26 | Disposition: A | Discharge status: Home / Self Care | Payer: Medicare HMO | Source: Ambulatory Visit | Attending: Nephrology | Admitting: Nephrology

## 2022-04-26 ENCOUNTER — Ambulatory Visit
Admission: RE | Admit: 2022-04-26 | Discharge: 2022-04-26 | Disposition: A | Discharge status: Home / Self Care | Payer: Medicare HMO | Source: Home / Self Care | Attending: Nephrology | Admitting: Nephrology

## 2022-04-26 ENCOUNTER — Other Ambulatory Visit: Payer: Self-pay | Admitting: Nephrology

## 2022-04-26 DIAGNOSIS — N186 End stage renal disease: Secondary | ICD-10-CM | POA: Insufficient documentation

## 2022-04-26 DIAGNOSIS — Z992 Dependence on renal dialysis: Secondary | ICD-10-CM | POA: Insufficient documentation

## 2022-04-26 DIAGNOSIS — K6389 Other specified diseases of intestine: Secondary | ICD-10-CM | POA: Diagnosis not present

## 2022-04-26 NOTE — Progress Notes (Signed)
PD cath malfunction - Abdomen X ray ordered to assess position. Laura Meyer

## 2022-04-27 DIAGNOSIS — Z992 Dependence on renal dialysis: Secondary | ICD-10-CM | POA: Diagnosis not present

## 2022-04-27 DIAGNOSIS — N186 End stage renal disease: Secondary | ICD-10-CM | POA: Diagnosis not present

## 2022-04-28 DIAGNOSIS — Z992 Dependence on renal dialysis: Secondary | ICD-10-CM | POA: Diagnosis not present

## 2022-04-28 DIAGNOSIS — N186 End stage renal disease: Secondary | ICD-10-CM | POA: Diagnosis not present

## 2022-04-29 DIAGNOSIS — N186 End stage renal disease: Secondary | ICD-10-CM | POA: Diagnosis not present

## 2022-04-29 DIAGNOSIS — Z992 Dependence on renal dialysis: Secondary | ICD-10-CM | POA: Diagnosis not present

## 2022-04-30 DIAGNOSIS — Z992 Dependence on renal dialysis: Secondary | ICD-10-CM | POA: Diagnosis not present

## 2022-04-30 DIAGNOSIS — N186 End stage renal disease: Secondary | ICD-10-CM | POA: Diagnosis not present

## 2022-05-01 DIAGNOSIS — N186 End stage renal disease: Secondary | ICD-10-CM | POA: Diagnosis not present

## 2022-05-01 DIAGNOSIS — Z992 Dependence on renal dialysis: Secondary | ICD-10-CM | POA: Diagnosis not present

## 2022-05-02 DIAGNOSIS — N186 End stage renal disease: Secondary | ICD-10-CM | POA: Diagnosis not present

## 2022-05-02 DIAGNOSIS — Z992 Dependence on renal dialysis: Secondary | ICD-10-CM | POA: Diagnosis not present

## 2022-05-03 DIAGNOSIS — Z992 Dependence on renal dialysis: Secondary | ICD-10-CM | POA: Diagnosis not present

## 2022-05-03 DIAGNOSIS — N186 End stage renal disease: Secondary | ICD-10-CM | POA: Diagnosis not present

## 2022-05-04 DIAGNOSIS — Z992 Dependence on renal dialysis: Secondary | ICD-10-CM | POA: Diagnosis not present

## 2022-05-04 DIAGNOSIS — N186 End stage renal disease: Secondary | ICD-10-CM | POA: Diagnosis not present

## 2022-05-05 DIAGNOSIS — Z992 Dependence on renal dialysis: Secondary | ICD-10-CM | POA: Diagnosis not present

## 2022-05-05 DIAGNOSIS — N186 End stage renal disease: Secondary | ICD-10-CM | POA: Diagnosis not present

## 2022-05-06 DIAGNOSIS — N186 End stage renal disease: Secondary | ICD-10-CM | POA: Diagnosis not present

## 2022-05-06 DIAGNOSIS — Z992 Dependence on renal dialysis: Secondary | ICD-10-CM | POA: Diagnosis not present

## 2022-05-07 ENCOUNTER — Encounter: Payer: Self-pay | Admitting: Interventional Radiology

## 2022-05-07 DIAGNOSIS — N186 End stage renal disease: Secondary | ICD-10-CM | POA: Diagnosis not present

## 2022-05-07 DIAGNOSIS — N2889 Other specified disorders of kidney and ureter: Secondary | ICD-10-CM

## 2022-05-07 DIAGNOSIS — Z992 Dependence on renal dialysis: Secondary | ICD-10-CM | POA: Diagnosis not present

## 2022-05-08 DIAGNOSIS — N186 End stage renal disease: Secondary | ICD-10-CM | POA: Diagnosis not present

## 2022-05-08 DIAGNOSIS — Z992 Dependence on renal dialysis: Secondary | ICD-10-CM | POA: Diagnosis not present

## 2022-05-09 DIAGNOSIS — N186 End stage renal disease: Secondary | ICD-10-CM | POA: Diagnosis not present

## 2022-05-09 DIAGNOSIS — Z992 Dependence on renal dialysis: Secondary | ICD-10-CM | POA: Diagnosis not present

## 2022-05-10 ENCOUNTER — Other Ambulatory Visit: Payer: Self-pay | Admitting: Interventional Radiology

## 2022-05-10 DIAGNOSIS — Z992 Dependence on renal dialysis: Secondary | ICD-10-CM | POA: Diagnosis not present

## 2022-05-10 DIAGNOSIS — N186 End stage renal disease: Secondary | ICD-10-CM | POA: Diagnosis not present

## 2022-05-10 DIAGNOSIS — N2889 Other specified disorders of kidney and ureter: Secondary | ICD-10-CM

## 2022-05-11 DIAGNOSIS — Z992 Dependence on renal dialysis: Secondary | ICD-10-CM | POA: Diagnosis not present

## 2022-05-11 DIAGNOSIS — N186 End stage renal disease: Secondary | ICD-10-CM | POA: Diagnosis not present

## 2022-05-12 ENCOUNTER — Other Ambulatory Visit: Payer: Self-pay | Admitting: Interventional Radiology

## 2022-05-12 DIAGNOSIS — Z0000 Encounter for general adult medical examination without abnormal findings: Secondary | ICD-10-CM | POA: Diagnosis not present

## 2022-05-12 DIAGNOSIS — E7800 Pure hypercholesterolemia, unspecified: Secondary | ICD-10-CM | POA: Diagnosis not present

## 2022-05-12 DIAGNOSIS — N186 End stage renal disease: Secondary | ICD-10-CM | POA: Diagnosis not present

## 2022-05-12 DIAGNOSIS — I120 Hypertensive chronic kidney disease with stage 5 chronic kidney disease or end stage renal disease: Secondary | ICD-10-CM | POA: Diagnosis not present

## 2022-05-12 DIAGNOSIS — Z992 Dependence on renal dialysis: Secondary | ICD-10-CM | POA: Diagnosis not present

## 2022-05-12 DIAGNOSIS — N2889 Other specified disorders of kidney and ureter: Secondary | ICD-10-CM

## 2022-05-12 DIAGNOSIS — N185 Chronic kidney disease, stage 5: Secondary | ICD-10-CM | POA: Diagnosis not present

## 2022-05-12 DIAGNOSIS — R7303 Prediabetes: Secondary | ICD-10-CM | POA: Diagnosis not present

## 2022-05-13 DIAGNOSIS — N186 End stage renal disease: Secondary | ICD-10-CM | POA: Diagnosis not present

## 2022-05-13 DIAGNOSIS — E785 Hyperlipidemia, unspecified: Secondary | ICD-10-CM | POA: Diagnosis not present

## 2022-05-13 DIAGNOSIS — Z794 Long term (current) use of insulin: Secondary | ICD-10-CM | POA: Diagnosis not present

## 2022-05-13 DIAGNOSIS — E119 Type 2 diabetes mellitus without complications: Secondary | ICD-10-CM | POA: Diagnosis not present

## 2022-05-13 DIAGNOSIS — Z5181 Encounter for therapeutic drug level monitoring: Secondary | ICD-10-CM | POA: Diagnosis not present

## 2022-05-13 DIAGNOSIS — Z79899 Other long term (current) drug therapy: Secondary | ICD-10-CM | POA: Diagnosis not present

## 2022-05-13 DIAGNOSIS — Z992 Dependence on renal dialysis: Secondary | ICD-10-CM | POA: Diagnosis not present

## 2022-05-14 DIAGNOSIS — Z992 Dependence on renal dialysis: Secondary | ICD-10-CM | POA: Diagnosis not present

## 2022-05-14 DIAGNOSIS — N186 End stage renal disease: Secondary | ICD-10-CM | POA: Diagnosis not present

## 2022-05-15 DIAGNOSIS — N186 End stage renal disease: Secondary | ICD-10-CM | POA: Diagnosis not present

## 2022-05-15 DIAGNOSIS — Z992 Dependence on renal dialysis: Secondary | ICD-10-CM | POA: Diagnosis not present

## 2022-05-16 DIAGNOSIS — Z992 Dependence on renal dialysis: Secondary | ICD-10-CM | POA: Diagnosis not present

## 2022-05-16 DIAGNOSIS — N186 End stage renal disease: Secondary | ICD-10-CM | POA: Diagnosis not present

## 2022-05-17 DIAGNOSIS — N186 End stage renal disease: Secondary | ICD-10-CM | POA: Diagnosis not present

## 2022-05-17 DIAGNOSIS — Z992 Dependence on renal dialysis: Secondary | ICD-10-CM | POA: Diagnosis not present

## 2022-05-18 DIAGNOSIS — N186 End stage renal disease: Secondary | ICD-10-CM | POA: Diagnosis not present

## 2022-05-18 DIAGNOSIS — Z992 Dependence on renal dialysis: Secondary | ICD-10-CM | POA: Diagnosis not present

## 2022-05-19 DIAGNOSIS — N186 End stage renal disease: Secondary | ICD-10-CM | POA: Diagnosis not present

## 2022-05-19 DIAGNOSIS — Z992 Dependence on renal dialysis: Secondary | ICD-10-CM | POA: Diagnosis not present

## 2022-05-20 DIAGNOSIS — N186 End stage renal disease: Secondary | ICD-10-CM | POA: Diagnosis not present

## 2022-05-20 DIAGNOSIS — Z992 Dependence on renal dialysis: Secondary | ICD-10-CM | POA: Diagnosis not present

## 2022-05-21 DIAGNOSIS — Z992 Dependence on renal dialysis: Secondary | ICD-10-CM | POA: Diagnosis not present

## 2022-05-21 DIAGNOSIS — N186 End stage renal disease: Secondary | ICD-10-CM | POA: Diagnosis not present

## 2022-05-22 DIAGNOSIS — Z992 Dependence on renal dialysis: Secondary | ICD-10-CM | POA: Diagnosis not present

## 2022-05-22 DIAGNOSIS — N186 End stage renal disease: Secondary | ICD-10-CM | POA: Diagnosis not present

## 2022-05-23 DIAGNOSIS — Z992 Dependence on renal dialysis: Secondary | ICD-10-CM | POA: Diagnosis not present

## 2022-05-23 DIAGNOSIS — N186 End stage renal disease: Secondary | ICD-10-CM | POA: Diagnosis not present

## 2022-05-24 DIAGNOSIS — N186 End stage renal disease: Secondary | ICD-10-CM | POA: Diagnosis not present

## 2022-05-24 DIAGNOSIS — Z992 Dependence on renal dialysis: Secondary | ICD-10-CM | POA: Diagnosis not present

## 2022-05-25 DIAGNOSIS — N186 End stage renal disease: Secondary | ICD-10-CM | POA: Diagnosis not present

## 2022-05-25 DIAGNOSIS — Z992 Dependence on renal dialysis: Secondary | ICD-10-CM | POA: Diagnosis not present

## 2022-05-26 DIAGNOSIS — Z992 Dependence on renal dialysis: Secondary | ICD-10-CM | POA: Diagnosis not present

## 2022-05-26 DIAGNOSIS — N186 End stage renal disease: Secondary | ICD-10-CM | POA: Diagnosis not present

## 2022-05-27 DIAGNOSIS — R7303 Prediabetes: Secondary | ICD-10-CM | POA: Diagnosis not present

## 2022-05-27 DIAGNOSIS — D3000 Benign neoplasm of unspecified kidney: Secondary | ICD-10-CM | POA: Diagnosis not present

## 2022-05-27 DIAGNOSIS — N186 End stage renal disease: Secondary | ICD-10-CM | POA: Diagnosis not present

## 2022-05-27 DIAGNOSIS — Z7982 Long term (current) use of aspirin: Secondary | ICD-10-CM | POA: Diagnosis not present

## 2022-05-27 DIAGNOSIS — I700 Atherosclerosis of aorta: Secondary | ICD-10-CM | POA: Diagnosis not present

## 2022-05-27 DIAGNOSIS — I120 Hypertensive chronic kidney disease with stage 5 chronic kidney disease or end stage renal disease: Secondary | ICD-10-CM | POA: Diagnosis not present

## 2022-05-27 DIAGNOSIS — Z7902 Long term (current) use of antithrombotics/antiplatelets: Secondary | ICD-10-CM | POA: Diagnosis not present

## 2022-05-27 DIAGNOSIS — D8481 Immunodeficiency due to conditions classified elsewhere: Secondary | ICD-10-CM | POA: Diagnosis not present

## 2022-05-27 DIAGNOSIS — Z992 Dependence on renal dialysis: Secondary | ICD-10-CM | POA: Diagnosis not present

## 2022-05-28 DIAGNOSIS — Z992 Dependence on renal dialysis: Secondary | ICD-10-CM | POA: Diagnosis not present

## 2022-05-28 DIAGNOSIS — N186 End stage renal disease: Secondary | ICD-10-CM | POA: Diagnosis not present

## 2022-05-29 DIAGNOSIS — N186 End stage renal disease: Secondary | ICD-10-CM | POA: Diagnosis not present

## 2022-05-29 DIAGNOSIS — Z992 Dependence on renal dialysis: Secondary | ICD-10-CM | POA: Diagnosis not present

## 2022-05-30 DIAGNOSIS — Z992 Dependence on renal dialysis: Secondary | ICD-10-CM | POA: Diagnosis not present

## 2022-05-30 DIAGNOSIS — N186 End stage renal disease: Secondary | ICD-10-CM | POA: Diagnosis not present

## 2022-05-31 DIAGNOSIS — N186 End stage renal disease: Secondary | ICD-10-CM | POA: Diagnosis not present

## 2022-05-31 DIAGNOSIS — Z992 Dependence on renal dialysis: Secondary | ICD-10-CM | POA: Diagnosis not present

## 2022-06-01 DIAGNOSIS — Z992 Dependence on renal dialysis: Secondary | ICD-10-CM | POA: Diagnosis not present

## 2022-06-01 DIAGNOSIS — N186 End stage renal disease: Secondary | ICD-10-CM | POA: Diagnosis not present

## 2022-06-02 DIAGNOSIS — Z992 Dependence on renal dialysis: Secondary | ICD-10-CM | POA: Diagnosis not present

## 2022-06-02 DIAGNOSIS — N186 End stage renal disease: Secondary | ICD-10-CM | POA: Diagnosis not present

## 2022-06-03 DIAGNOSIS — Z992 Dependence on renal dialysis: Secondary | ICD-10-CM | POA: Diagnosis not present

## 2022-06-03 DIAGNOSIS — N186 End stage renal disease: Secondary | ICD-10-CM | POA: Diagnosis not present

## 2022-06-04 DIAGNOSIS — N186 End stage renal disease: Secondary | ICD-10-CM | POA: Diagnosis not present

## 2022-06-04 DIAGNOSIS — Z992 Dependence on renal dialysis: Secondary | ICD-10-CM | POA: Diagnosis not present

## 2022-06-05 DIAGNOSIS — Z992 Dependence on renal dialysis: Secondary | ICD-10-CM | POA: Diagnosis not present

## 2022-06-05 DIAGNOSIS — N186 End stage renal disease: Secondary | ICD-10-CM | POA: Diagnosis not present

## 2022-06-06 DIAGNOSIS — N186 End stage renal disease: Secondary | ICD-10-CM | POA: Diagnosis not present

## 2022-06-06 DIAGNOSIS — Z992 Dependence on renal dialysis: Secondary | ICD-10-CM | POA: Diagnosis not present

## 2022-06-07 DIAGNOSIS — Z992 Dependence on renal dialysis: Secondary | ICD-10-CM | POA: Diagnosis not present

## 2022-06-07 DIAGNOSIS — N186 End stage renal disease: Secondary | ICD-10-CM | POA: Diagnosis not present

## 2022-06-08 DIAGNOSIS — Z992 Dependence on renal dialysis: Secondary | ICD-10-CM | POA: Diagnosis not present

## 2022-06-08 DIAGNOSIS — N186 End stage renal disease: Secondary | ICD-10-CM | POA: Diagnosis not present

## 2022-06-09 DIAGNOSIS — Z992 Dependence on renal dialysis: Secondary | ICD-10-CM | POA: Diagnosis not present

## 2022-06-09 DIAGNOSIS — N186 End stage renal disease: Secondary | ICD-10-CM | POA: Diagnosis not present

## 2022-06-10 DIAGNOSIS — Z992 Dependence on renal dialysis: Secondary | ICD-10-CM | POA: Diagnosis not present

## 2022-06-10 DIAGNOSIS — N186 End stage renal disease: Secondary | ICD-10-CM | POA: Diagnosis not present

## 2022-06-11 DIAGNOSIS — Z992 Dependence on renal dialysis: Secondary | ICD-10-CM | POA: Diagnosis not present

## 2022-06-11 DIAGNOSIS — N186 End stage renal disease: Secondary | ICD-10-CM | POA: Diagnosis not present

## 2022-06-12 DIAGNOSIS — N186 End stage renal disease: Secondary | ICD-10-CM | POA: Diagnosis not present

## 2022-06-12 DIAGNOSIS — Z992 Dependence on renal dialysis: Secondary | ICD-10-CM | POA: Diagnosis not present

## 2022-06-13 DIAGNOSIS — Z992 Dependence on renal dialysis: Secondary | ICD-10-CM | POA: Diagnosis not present

## 2022-06-13 DIAGNOSIS — N186 End stage renal disease: Secondary | ICD-10-CM | POA: Diagnosis not present

## 2022-06-14 DIAGNOSIS — N186 End stage renal disease: Secondary | ICD-10-CM | POA: Diagnosis not present

## 2022-06-14 DIAGNOSIS — Z992 Dependence on renal dialysis: Secondary | ICD-10-CM | POA: Diagnosis not present

## 2022-06-15 DIAGNOSIS — N186 End stage renal disease: Secondary | ICD-10-CM | POA: Diagnosis not present

## 2022-06-15 DIAGNOSIS — Z992 Dependence on renal dialysis: Secondary | ICD-10-CM | POA: Diagnosis not present

## 2022-06-16 DIAGNOSIS — N186 End stage renal disease: Secondary | ICD-10-CM | POA: Diagnosis not present

## 2022-06-16 DIAGNOSIS — Z992 Dependence on renal dialysis: Secondary | ICD-10-CM | POA: Diagnosis not present

## 2022-06-17 DIAGNOSIS — Z992 Dependence on renal dialysis: Secondary | ICD-10-CM | POA: Diagnosis not present

## 2022-06-17 DIAGNOSIS — N186 End stage renal disease: Secondary | ICD-10-CM | POA: Diagnosis not present

## 2022-06-18 DIAGNOSIS — Z992 Dependence on renal dialysis: Secondary | ICD-10-CM | POA: Diagnosis not present

## 2022-06-18 DIAGNOSIS — N186 End stage renal disease: Secondary | ICD-10-CM | POA: Diagnosis not present

## 2022-06-19 DIAGNOSIS — Z992 Dependence on renal dialysis: Secondary | ICD-10-CM | POA: Diagnosis not present

## 2022-06-19 DIAGNOSIS — N186 End stage renal disease: Secondary | ICD-10-CM | POA: Diagnosis not present

## 2022-06-20 DIAGNOSIS — N186 End stage renal disease: Secondary | ICD-10-CM | POA: Diagnosis not present

## 2022-06-20 DIAGNOSIS — Z992 Dependence on renal dialysis: Secondary | ICD-10-CM | POA: Diagnosis not present

## 2022-06-21 DIAGNOSIS — N186 End stage renal disease: Secondary | ICD-10-CM | POA: Diagnosis not present

## 2022-06-21 DIAGNOSIS — Z992 Dependence on renal dialysis: Secondary | ICD-10-CM | POA: Diagnosis not present

## 2022-06-22 DIAGNOSIS — Z992 Dependence on renal dialysis: Secondary | ICD-10-CM | POA: Diagnosis not present

## 2022-06-22 DIAGNOSIS — N186 End stage renal disease: Secondary | ICD-10-CM | POA: Diagnosis not present

## 2022-06-23 DIAGNOSIS — Z992 Dependence on renal dialysis: Secondary | ICD-10-CM | POA: Diagnosis not present

## 2022-06-23 DIAGNOSIS — N186 End stage renal disease: Secondary | ICD-10-CM | POA: Diagnosis not present

## 2022-06-24 DIAGNOSIS — Z992 Dependence on renal dialysis: Secondary | ICD-10-CM | POA: Diagnosis not present

## 2022-06-24 DIAGNOSIS — N186 End stage renal disease: Secondary | ICD-10-CM | POA: Diagnosis not present

## 2022-06-25 DIAGNOSIS — N186 End stage renal disease: Secondary | ICD-10-CM | POA: Diagnosis not present

## 2022-06-25 DIAGNOSIS — Z992 Dependence on renal dialysis: Secondary | ICD-10-CM | POA: Diagnosis not present

## 2022-06-26 DIAGNOSIS — Z992 Dependence on renal dialysis: Secondary | ICD-10-CM | POA: Diagnosis not present

## 2022-06-26 DIAGNOSIS — N186 End stage renal disease: Secondary | ICD-10-CM | POA: Diagnosis not present

## 2022-06-27 DIAGNOSIS — N186 End stage renal disease: Secondary | ICD-10-CM | POA: Diagnosis not present

## 2022-06-27 DIAGNOSIS — Z992 Dependence on renal dialysis: Secondary | ICD-10-CM | POA: Diagnosis not present

## 2022-06-28 DIAGNOSIS — N186 End stage renal disease: Secondary | ICD-10-CM | POA: Diagnosis not present

## 2022-06-28 DIAGNOSIS — Z992 Dependence on renal dialysis: Secondary | ICD-10-CM | POA: Diagnosis not present

## 2022-06-29 DIAGNOSIS — N186 End stage renal disease: Secondary | ICD-10-CM | POA: Diagnosis not present

## 2022-06-29 DIAGNOSIS — Z992 Dependence on renal dialysis: Secondary | ICD-10-CM | POA: Diagnosis not present

## 2022-06-30 ENCOUNTER — Other Ambulatory Visit: Payer: Self-pay | Admitting: Interventional Radiology

## 2022-06-30 DIAGNOSIS — Z992 Dependence on renal dialysis: Secondary | ICD-10-CM | POA: Diagnosis not present

## 2022-06-30 DIAGNOSIS — N2889 Other specified disorders of kidney and ureter: Secondary | ICD-10-CM

## 2022-06-30 DIAGNOSIS — N186 End stage renal disease: Secondary | ICD-10-CM | POA: Diagnosis not present

## 2022-07-01 DIAGNOSIS — N186 End stage renal disease: Secondary | ICD-10-CM | POA: Diagnosis not present

## 2022-07-01 DIAGNOSIS — Z992 Dependence on renal dialysis: Secondary | ICD-10-CM | POA: Diagnosis not present

## 2022-07-02 DIAGNOSIS — Z992 Dependence on renal dialysis: Secondary | ICD-10-CM | POA: Diagnosis not present

## 2022-07-02 DIAGNOSIS — N186 End stage renal disease: Secondary | ICD-10-CM | POA: Diagnosis not present

## 2022-07-03 DIAGNOSIS — Z992 Dependence on renal dialysis: Secondary | ICD-10-CM | POA: Diagnosis not present

## 2022-07-03 DIAGNOSIS — N186 End stage renal disease: Secondary | ICD-10-CM | POA: Diagnosis not present

## 2022-07-04 DIAGNOSIS — N186 End stage renal disease: Secondary | ICD-10-CM | POA: Diagnosis not present

## 2022-07-04 DIAGNOSIS — Z992 Dependence on renal dialysis: Secondary | ICD-10-CM | POA: Diagnosis not present

## 2022-07-05 DIAGNOSIS — Z992 Dependence on renal dialysis: Secondary | ICD-10-CM | POA: Diagnosis not present

## 2022-07-05 DIAGNOSIS — N186 End stage renal disease: Secondary | ICD-10-CM | POA: Diagnosis not present

## 2022-07-06 DIAGNOSIS — Z992 Dependence on renal dialysis: Secondary | ICD-10-CM | POA: Diagnosis not present

## 2022-07-06 DIAGNOSIS — N186 End stage renal disease: Secondary | ICD-10-CM | POA: Diagnosis not present

## 2022-07-07 DIAGNOSIS — N186 End stage renal disease: Secondary | ICD-10-CM | POA: Diagnosis not present

## 2022-07-07 DIAGNOSIS — Z992 Dependence on renal dialysis: Secondary | ICD-10-CM | POA: Diagnosis not present

## 2022-07-08 ENCOUNTER — Ambulatory Visit
Admission: RE | Admit: 2022-07-08 | Discharge: 2022-07-08 | Disposition: A | Discharge status: Home / Self Care | Payer: Medicare HMO | Source: Ambulatory Visit | Attending: Interventional Radiology | Admitting: Interventional Radiology

## 2022-07-08 DIAGNOSIS — I700 Atherosclerosis of aorta: Secondary | ICD-10-CM | POA: Diagnosis not present

## 2022-07-08 DIAGNOSIS — R188 Other ascites: Secondary | ICD-10-CM | POA: Diagnosis not present

## 2022-07-08 DIAGNOSIS — N2889 Other specified disorders of kidney and ureter: Secondary | ICD-10-CM

## 2022-07-08 DIAGNOSIS — Z992 Dependence on renal dialysis: Secondary | ICD-10-CM | POA: Diagnosis not present

## 2022-07-08 DIAGNOSIS — N186 End stage renal disease: Secondary | ICD-10-CM | POA: Diagnosis not present

## 2022-07-08 DIAGNOSIS — D3002 Benign neoplasm of left kidney: Secondary | ICD-10-CM | POA: Diagnosis not present

## 2022-07-08 DIAGNOSIS — D3001 Benign neoplasm of right kidney: Secondary | ICD-10-CM | POA: Diagnosis not present

## 2022-07-08 DIAGNOSIS — K449 Diaphragmatic hernia without obstruction or gangrene: Secondary | ICD-10-CM | POA: Diagnosis not present

## 2022-07-08 DIAGNOSIS — K8000 Calculus of gallbladder with acute cholecystitis without obstruction: Secondary | ICD-10-CM | POA: Diagnosis not present

## 2022-07-08 MED ORDER — IOPAMIDOL (ISOVUE-300) INJECTION 61%
100.0000 mL | Freq: Once | INTRAVENOUS | Status: AC | PRN
  Administered 2022-07-08: 100 mL via INTRAVENOUS

## 2022-07-09 DIAGNOSIS — Z992 Dependence on renal dialysis: Secondary | ICD-10-CM | POA: Diagnosis not present

## 2022-07-09 DIAGNOSIS — N186 End stage renal disease: Secondary | ICD-10-CM | POA: Diagnosis not present

## 2022-07-10 DIAGNOSIS — N186 End stage renal disease: Secondary | ICD-10-CM | POA: Diagnosis not present

## 2022-07-10 DIAGNOSIS — Z992 Dependence on renal dialysis: Secondary | ICD-10-CM | POA: Diagnosis not present

## 2022-07-11 DIAGNOSIS — Z992 Dependence on renal dialysis: Secondary | ICD-10-CM | POA: Diagnosis not present

## 2022-07-11 DIAGNOSIS — N186 End stage renal disease: Secondary | ICD-10-CM | POA: Diagnosis not present

## 2022-07-12 DIAGNOSIS — N186 End stage renal disease: Secondary | ICD-10-CM | POA: Diagnosis not present

## 2022-07-12 DIAGNOSIS — Z992 Dependence on renal dialysis: Secondary | ICD-10-CM | POA: Diagnosis not present

## 2022-07-13 DIAGNOSIS — Z992 Dependence on renal dialysis: Secondary | ICD-10-CM | POA: Diagnosis not present

## 2022-07-13 DIAGNOSIS — N186 End stage renal disease: Secondary | ICD-10-CM | POA: Diagnosis not present

## 2022-07-14 DIAGNOSIS — Z992 Dependence on renal dialysis: Secondary | ICD-10-CM | POA: Diagnosis not present

## 2022-07-14 DIAGNOSIS — N186 End stage renal disease: Secondary | ICD-10-CM | POA: Diagnosis not present

## 2022-07-15 DIAGNOSIS — N186 End stage renal disease: Secondary | ICD-10-CM | POA: Diagnosis not present

## 2022-07-15 DIAGNOSIS — Z992 Dependence on renal dialysis: Secondary | ICD-10-CM | POA: Diagnosis not present

## 2022-07-16 DIAGNOSIS — N186 End stage renal disease: Secondary | ICD-10-CM | POA: Diagnosis not present

## 2022-07-16 DIAGNOSIS — Z992 Dependence on renal dialysis: Secondary | ICD-10-CM | POA: Diagnosis not present

## 2022-07-17 DIAGNOSIS — Z992 Dependence on renal dialysis: Secondary | ICD-10-CM | POA: Diagnosis not present

## 2022-07-17 DIAGNOSIS — N186 End stage renal disease: Secondary | ICD-10-CM | POA: Diagnosis not present

## 2022-07-18 DIAGNOSIS — Z992 Dependence on renal dialysis: Secondary | ICD-10-CM | POA: Diagnosis not present

## 2022-07-18 DIAGNOSIS — N186 End stage renal disease: Secondary | ICD-10-CM | POA: Diagnosis not present

## 2022-07-19 DIAGNOSIS — N186 End stage renal disease: Secondary | ICD-10-CM | POA: Diagnosis not present

## 2022-07-19 DIAGNOSIS — Z992 Dependence on renal dialysis: Secondary | ICD-10-CM | POA: Diagnosis not present

## 2022-07-20 DIAGNOSIS — N186 End stage renal disease: Secondary | ICD-10-CM | POA: Diagnosis not present

## 2022-07-20 DIAGNOSIS — Z992 Dependence on renal dialysis: Secondary | ICD-10-CM | POA: Diagnosis not present

## 2022-07-21 DIAGNOSIS — Z992 Dependence on renal dialysis: Secondary | ICD-10-CM | POA: Diagnosis not present

## 2022-07-21 DIAGNOSIS — N186 End stage renal disease: Secondary | ICD-10-CM | POA: Diagnosis not present

## 2022-07-22 ENCOUNTER — Ambulatory Visit
Admission: RE | Admit: 2022-07-22 | Discharge: 2022-07-22 | Disposition: A | Discharge status: Home / Self Care | Payer: Medicare HMO | Source: Ambulatory Visit | Attending: Interventional Radiology | Admitting: Interventional Radiology

## 2022-07-22 DIAGNOSIS — N2889 Other specified disorders of kidney and ureter: Secondary | ICD-10-CM | POA: Diagnosis not present

## 2022-07-22 DIAGNOSIS — Z992 Dependence on renal dialysis: Secondary | ICD-10-CM | POA: Diagnosis not present

## 2022-07-22 DIAGNOSIS — N186 End stage renal disease: Secondary | ICD-10-CM | POA: Diagnosis not present

## 2022-07-22 HISTORY — PX: IR RADIOLOGIST EVAL & MGMT: IMG5224

## 2022-07-22 NOTE — Progress Notes (Signed)
Chief Complaint: Patient was consulted remotely today (TeleHealth) for right sided low-grade oncocytic neoplasm at the request of Amogh Komatsu K.    Referring Physician(s): Dr. Legrand Rams   History of Present Illness: Laura Meyer is a 82 y.o. female  with end-stage renal disease currently on dialysis via peritoneal dialysis.  While undergoing work-up for her underlying renal dysfunction, a 3.5 cm lesion was identified in the lower pole of the right kidney.  Follow-up MRIs were performed without contrast in November 2022 and January 2023 demonstrating slight interval growth of the complex cystic lesion.  Subsequently, she underwent further evaluation with contrast-enhanced CT scan of the abdomen on 09/14/2021 which demonstrates an avidly enhancing an enlarging mass exophytic from the lower pole of the right kidney consistent with an enlarging renal cell carcinoma.  The mass measures 3.8 x 3.8 x 4.2 cm.  Surgical pathology revealed a low-grade oncocytic neoplasm.  This is a relatively new neoplastic classification.  To date, these tumors have not been shown to be aggressive.  This is very good news.  I reviewed these biopsy results with Laura Meyer.  She underwent percutaneous biopsy and concurrent microwave ablation on 11/04/2021.  We spoke over the phone today for her 6 month follow-up evaluation.  she is completely recovered and doing very well.  She has no active complaints.  We reviewed her initial surveillance imaging.  It looks like we have an excellent result of her primary lesion.  No evidence of residual enhancing tissue.  The small possible lesions of the upper pole of the left kidney remained stable and demonstrate no interval growth.  CT ABD 07/08/22  1. Status post ablation of an exophytic lower pole right renal mass. Although the mass is larger than the most recent diagnostic CT of 09/14/2021, there is no evidence of residual postcontrast enhancement to suggest viable  tumor. No evidence of abdominal  metastatic disease. 2. The tandem small enhancing upper pole left renal lesions are similar, and remains suspicious for adjacent renal cell carcinomas.  3. A probable complex cyst in the more central lower pole right kidney warrants follow-up attention with pre and post-contrast CT or MRI at 6 months.  Past Medical History:  Diagnosis Date   Anemia    Arthritis    hands   CKD (chronic kidney disease), stage IV (HCC)    Hypertension    Pre-diabetes    Renal insufficiency    Vertigo 2019   2 episodes.  none in over 1 yr.    Past Surgical History:  Procedure Laterality Date   CAPD INSERTION N/A 03/12/2021   Procedure: LAPAROSCOPIC INSERTION CONTINUOUS AMBULATORY PERITONEAL DIALYSIS  (CAPD) CATHETER;  Surgeon: Henrene Dodge, MD;  Location: [facility];  Service: General;  Laterality: N/A;  Provider requesting 2 hours / 120 minutes for procedure.   CATARACT EXTRACTION W/PHACO Left 06/28/2018   Procedure: CATARACT EXTRACTION PHACO AND INTRAOCULAR LENS PLACEMENT (IOC)  LEFT;  Surgeon: Lockie Mola, MD;  Location: [facility];  Service: Ophthalmology;  Laterality: Left;   CATARACT EXTRACTION W/PHACO Right 07/19/2018   Procedure: CATARACT EXTRACTION PHACO AND INTRAOCULAR LENS PLACEMENT (IOC) RIGHT;  Surgeon: Lockie Mola, MD;  Location: [facility];  Service: Ophthalmology;  Laterality: Right;   DILATION AND CURETTAGE OF UTERUS     IR RADIOLOGIST EVAL & MGMT  10/06/2021   IR RADIOLOGIST EVAL & MGMT  11/19/2021   IR RADIOLOGIST EVAL & MGMT  07/22/2022   PARATHYROIDECTOMY     RADIOLOGY WITH ANESTHESIA  N/A 11/04/2021   Procedure: CT MICROWAVE ABLATION WITH ANESTHESIA;  Surgeon: Sterling Big, MD;  Location: [facility];  Service: Radiology;  Laterality: N/A;   REPLACEMENT TOTAL KNEE Bilateral 07/03/2003   left - 07/03/03, right - 07/26/12    Allergies: Contrast media [iodinated contrast media] and Iodine  Medications: Prior to Admission  medications   Medication Sig Start Date End Date Taking? Authorizing Provider  acetaminophen (TYLENOL) 500 MG tablet Take 1,000 mg by mouth every 6 (six) hours as needed for moderate pain or mild pain.    [provider]  allopurinol (ZYLOPRIM) 100 MG tablet Take 100 mg by mouth in the morning.    [provider]  aspirin EC 81 MG tablet Take 81 mg by mouth in the morning.    [provider]  carvedilol (COREG) 12.5 MG tablet Take 12.5 mg by mouth 2 (two) times daily with a meal.    [provider]  Cholecalciferol (VITAMIN D3) 50 MCG (2000 UT) TABS Take 2,000 Units by mouth in the morning.    [provider]  docusate sodium (COLACE) 100 MG capsule Take 200 mg by mouth in the morning.    [provider]  furosemide (LASIX) 80 MG tablet Take 80 mg by mouth daily as needed for fluid.    [provider]  HYDROcodone-acetaminophen (NORCO) 5-325 MG tablet Take 1-2 tablets by mouth every 6 (six) hours as needed for moderate pain. 11/04/21   Allred, Darrell K, PA-C  Polyethyl Glycol-Propyl Glycol (SYSTANE) 0.4-0.3 % SOLN Place 1 drop into both eyes in the morning.    [provider]  simvastatin (ZOCOR) 20 MG tablet Take 20 mg by mouth at bedtime.    [provider]  sodium chloride (MURO 128) 5 % ophthalmic ointment Place 1 application into both eyes at bedtime.    [provider]     No family history on file.  Social History   Socioeconomic History   Marital status: Widowed    Spouse name: Not on file   Number of children: Not on file   Years of education: Not on file   Highest education level: Not on file  Occupational History   Not on file  Tobacco Use   Smoking status: Never    Passive exposure: Never   Smokeless tobacco: Never  Vaping Use   Vaping Use: Never used  Substance and Sexual Activity   Alcohol use: Not Currently   Drug use: Never   Sexual activity: Not Currently  Other Topics  Concern   Not on file  Social History Narrative   Not on file   Social Determinants of Health   Financial Resource Strain: Not on file  Food Insecurity: Not on file  Transportation Needs: Not on file  Physical Activity: Not on file  Stress: Not on file  Social Connections: Not on file    ECOG Status: 0 - Asymptomatic  Review of Systems  Review of Systems: A 12 point ROS discussed and pertinent positives are indicated in the HPI above.  All other systems are negative.  Advance Care Plan: The advanced care plan/surrogate decision maker was discussed at the time of visit and the patient did not wish to discuss or was not able to name a surrogate decision maker or provide an advance care plan.    Physical Exam No direct physical exam was performed (except for noted visual exam findings with Video Visits).    Vital Signs: There were no vitals  taken for this visit.  Imaging: IR Radiologist Eval & Mgmt  Result Date: 07/22/2022 EXAM: ESTABLISHED PATIENT OFFICE VISIT CHIEF COMPLAINT: SEE EPIC NOTE HISTORY OF PRESENT ILLNESS: SEE EPIC NOTE REVIEW OF SYSTEMS: SEE EPIC NOTE PHYSICAL EXAMINATION: SEE EPIC NOTE ASSESSMENT AND PLAN: SEE EPIC NOTE Electronically Signed   By: Malachy Moan M.D.   On: 07/22/2022 11:53   CT ABDOMEN W WO CONTRAST  Result Date: 07/14/2022 CLINICAL DATA:  Status post microwave ablation of a right renal mass on 11/04/2021. * Tracking Code: BO * EXAM: CT ABDOMEN WITHOUT AND WITH CONTRAST TECHNIQUE: Multidetector CT imaging of the abdomen was performed following the standard protocol before and following the bolus administration of intravenous contrast. RADIATION DOSE REDUCTION: This exam was performed according to the departmental dose-optimization program which includes automated exposure control, adjustment of the mA and/or kV according to patient size and/or use of iterative reconstruction technique. CONTRAST:  ISOVUE-300 IOPAMIDOL (ISOVUE-300) INJECTION  61% COMPARISON:  09/14/2021 FINDINGS: Lower chest: 2-3 mm left lower lobe pulmonary nodule is present on 05/23/2012 and considered benign. Normal heart size without pericardial or pleural effusion. Hepatobiliary: Arterially hyperenhancing segment 2 7 mm lesion is similar to on the prior exam, favoring a hemangioma or perfusion anomaly. No suspicious liver lesion. 1.9 cm gallstone without acute cholecystitis or biliary duct dilatation. Pancreas: Cystic foci within the pancreas are better evaluated on 02/26/2021 abdominal MRI. No duct dilatation or acute inflammation. Spleen: Normal in size, without focal abnormality. Adrenals/Urinary Tract: Normal adrenal glands. No renal calculi or hydronephrosis. The ablated mass exophytic off the lower pole right kidney measures 4.2 x 3.8 cm on 56/16 versus 3.8 x 3.8 cm on 09/14/2021 CT. Measures 33 HU prior to contrast without postcontrast enhancement. Surrounding ablation related interstitial thickening. Multiple bilateral renal cysts and too small to characterize lesions. Lower pole left renal 2.2 cm complex cyst, including on 61/6. Lower pole right renal lesion just anterior and central to the ablation site measures 1.6 cm including on 60/10 and is favored to represent a minimally complex cyst. This lesion is not readily apparent on the prior. The suspicious tandem foci of enhancement within the upper pole left kidney are again identified at 12 and 9 mm on 32/6. Compare similar (when remeasured). No hydronephrosis. Stomach/Bowel: Tiny hiatal hernia. Fluid herniates through the esophageal hiatus, as before. Normal colon and terminal ileum. Normal small bowel. Vascular/Lymphatic: Aortic atherosclerosis. Patent renal veins. No retroperitoneal or retrocrural adenopathy. Other: Small volume perihepatic ascites again identified. No free intraperitoneal air. Incompletely imaged peritoneal dialysis catheter. Musculoskeletal: Lumbar spondylosis. Minimal S shaped lumbar spine curvature.  IMPRESSION: 1. Status post ablation of an exophytic lower pole right renal mass. Although the mass is larger than the most recent diagnostic CT of 09/14/2021, there is no evidence of residual postcontrast enhancement to suggest viable tumor. No evidence of abdominal metastatic disease. 2. The tandem small enhancing upper pole left renal lesions are similar, and remains suspicious for adjacent renal cell carcinomas. 3. A probable complex cyst in the more central lower pole right kidney warrants follow-up attention with pre and post-contrast CT or MRI at 6 months. 4. Similar small volume abdominal ascites. 5.  Tiny hiatal hernia. 6. Cholelithiasis 7.  Aortic Atherosclerosis (ICD10-I70.0). Electronically Signed   By: Jeronimo Greaves M.D.   On: 07/14/2022 16:20    Labs:  CBC: Recent Labs    10/22/21 1300  WBC 7.6  HGB 10.3*  HCT 31.4*  PLT 202    COAGS: Recent Labs  10/22/21 1300  INR 1.1    BMP: Recent Labs    11/04/21 0746  NA 141  K 3.4*  CL 103  CO2 24  GLUCOSE 136*  BUN 90*  CALCIUM 9.9  CREATININE 4.57*  GFRNONAA 9*    LIVER FUNCTION TESTS: No results for input(s): "BILITOT", "AST", "ALT", "ALKPHOS", "PROT", "ALBUMIN" in the last 8760 hours.  TUMOR MARKERS: No results for input(s): "AFPTM", "CEA", "CA199", "CHROMGRNA" in the last 8760 hours.  Assessment and Plan:  Extremely pleasant 82 year old female with a history of right-sided low-grade oncocytic neoplasm.  She underwent percutaneous microwave ablation on 11/04/2021.  Her first 9-month postprocedure surveillance imaging demonstrates an excellent treatment effect.  No evidence of residual enhancing tissue at the previously treated lesion.  Further, the 2 small enhancing lesions at the upper pole of the left kidney remained stable without evidence of interval growth.  We will continue routine imaging surveillance.  1.)  Repeat renal protocol CT scan of the abdomen and accompanying clinic visit in 6  months.    Electronically Signed: Sterling Big 07/22/2022, 12:43 PM   I spent a total of  15 Minutes in remote  clinical consultation, greater than 50% of which was counseling/coordinating care for right renal low grade oncocytic neoplasm.    Visit type: Audio only (telephone). Audio (no video) only due to patient preference. Alternative for in-person consultation at [facility], [address] [place], [place], [place]. This visit type was conducted due to national recommendations for restrictions regarding the COVID-19 Pandemic (e.g. social distancing).  This format is felt to be most appropriate for this patient at this time.  All issues noted in this document were discussed and addressed.

## 2022-07-23 DIAGNOSIS — Z992 Dependence on renal dialysis: Secondary | ICD-10-CM | POA: Diagnosis not present

## 2022-07-23 DIAGNOSIS — N186 End stage renal disease: Secondary | ICD-10-CM | POA: Diagnosis not present

## 2022-07-24 DIAGNOSIS — Z992 Dependence on renal dialysis: Secondary | ICD-10-CM | POA: Diagnosis not present

## 2022-07-24 DIAGNOSIS — N186 End stage renal disease: Secondary | ICD-10-CM | POA: Diagnosis not present

## 2022-07-25 DIAGNOSIS — N186 End stage renal disease: Secondary | ICD-10-CM | POA: Diagnosis not present

## 2022-07-25 DIAGNOSIS — Z992 Dependence on renal dialysis: Secondary | ICD-10-CM | POA: Diagnosis not present

## 2022-07-26 DIAGNOSIS — Z992 Dependence on renal dialysis: Secondary | ICD-10-CM | POA: Diagnosis not present

## 2022-07-26 DIAGNOSIS — N186 End stage renal disease: Secondary | ICD-10-CM | POA: Diagnosis not present

## 2022-07-27 DIAGNOSIS — Z992 Dependence on renal dialysis: Secondary | ICD-10-CM | POA: Diagnosis not present

## 2022-07-27 DIAGNOSIS — N186 End stage renal disease: Secondary | ICD-10-CM | POA: Diagnosis not present

## 2022-07-28 DIAGNOSIS — Z992 Dependence on renal dialysis: Secondary | ICD-10-CM | POA: Diagnosis not present

## 2022-07-28 DIAGNOSIS — N186 End stage renal disease: Secondary | ICD-10-CM | POA: Diagnosis not present

## 2022-07-29 DIAGNOSIS — N186 End stage renal disease: Secondary | ICD-10-CM | POA: Diagnosis not present

## 2022-07-29 DIAGNOSIS — Z992 Dependence on renal dialysis: Secondary | ICD-10-CM | POA: Diagnosis not present

## 2022-07-30 DIAGNOSIS — N186 End stage renal disease: Secondary | ICD-10-CM | POA: Diagnosis not present

## 2022-07-30 DIAGNOSIS — Z992 Dependence on renal dialysis: Secondary | ICD-10-CM | POA: Diagnosis not present

## 2022-07-31 DIAGNOSIS — Z992 Dependence on renal dialysis: Secondary | ICD-10-CM | POA: Diagnosis not present

## 2022-07-31 DIAGNOSIS — N186 End stage renal disease: Secondary | ICD-10-CM | POA: Diagnosis not present

## 2022-08-01 DIAGNOSIS — Z992 Dependence on renal dialysis: Secondary | ICD-10-CM | POA: Diagnosis not present

## 2022-08-01 DIAGNOSIS — N186 End stage renal disease: Secondary | ICD-10-CM | POA: Diagnosis not present

## 2022-08-02 DIAGNOSIS — N186 End stage renal disease: Secondary | ICD-10-CM | POA: Diagnosis not present

## 2022-08-02 DIAGNOSIS — Z992 Dependence on renal dialysis: Secondary | ICD-10-CM | POA: Diagnosis not present

## 2022-08-03 DIAGNOSIS — Z79899 Other long term (current) drug therapy: Secondary | ICD-10-CM | POA: Diagnosis not present

## 2022-08-03 DIAGNOSIS — E119 Type 2 diabetes mellitus without complications: Secondary | ICD-10-CM | POA: Diagnosis not present

## 2022-08-03 DIAGNOSIS — N186 End stage renal disease: Secondary | ICD-10-CM | POA: Diagnosis not present

## 2022-08-03 DIAGNOSIS — E785 Hyperlipidemia, unspecified: Secondary | ICD-10-CM | POA: Diagnosis not present

## 2022-08-03 DIAGNOSIS — Z992 Dependence on renal dialysis: Secondary | ICD-10-CM | POA: Diagnosis not present

## 2022-08-03 DIAGNOSIS — Z794 Long term (current) use of insulin: Secondary | ICD-10-CM | POA: Diagnosis not present

## 2022-08-03 DIAGNOSIS — Z5181 Encounter for therapeutic drug level monitoring: Secondary | ICD-10-CM | POA: Diagnosis not present

## 2022-08-04 DIAGNOSIS — N186 End stage renal disease: Secondary | ICD-10-CM | POA: Diagnosis not present

## 2022-08-04 DIAGNOSIS — Z992 Dependence on renal dialysis: Secondary | ICD-10-CM | POA: Diagnosis not present

## 2022-08-05 DIAGNOSIS — Z992 Dependence on renal dialysis: Secondary | ICD-10-CM | POA: Diagnosis not present

## 2022-08-05 DIAGNOSIS — N186 End stage renal disease: Secondary | ICD-10-CM | POA: Diagnosis not present

## 2022-08-06 DIAGNOSIS — Z992 Dependence on renal dialysis: Secondary | ICD-10-CM | POA: Diagnosis not present

## 2022-08-06 DIAGNOSIS — N186 End stage renal disease: Secondary | ICD-10-CM | POA: Diagnosis not present

## 2022-08-07 DIAGNOSIS — N186 End stage renal disease: Secondary | ICD-10-CM | POA: Diagnosis not present

## 2022-08-07 DIAGNOSIS — Z992 Dependence on renal dialysis: Secondary | ICD-10-CM | POA: Diagnosis not present

## 2022-08-08 DIAGNOSIS — N186 End stage renal disease: Secondary | ICD-10-CM | POA: Diagnosis not present

## 2022-08-08 DIAGNOSIS — Z992 Dependence on renal dialysis: Secondary | ICD-10-CM | POA: Diagnosis not present

## 2022-08-09 DIAGNOSIS — Z992 Dependence on renal dialysis: Secondary | ICD-10-CM | POA: Diagnosis not present

## 2022-08-09 DIAGNOSIS — N186 End stage renal disease: Secondary | ICD-10-CM | POA: Diagnosis not present

## 2022-08-10 DIAGNOSIS — N186 End stage renal disease: Secondary | ICD-10-CM | POA: Diagnosis not present

## 2022-08-10 DIAGNOSIS — Z992 Dependence on renal dialysis: Secondary | ICD-10-CM | POA: Diagnosis not present

## 2022-08-11 DIAGNOSIS — Z992 Dependence on renal dialysis: Secondary | ICD-10-CM | POA: Diagnosis not present

## 2022-08-11 DIAGNOSIS — N186 End stage renal disease: Secondary | ICD-10-CM | POA: Diagnosis not present

## 2022-08-12 DIAGNOSIS — N186 End stage renal disease: Secondary | ICD-10-CM | POA: Diagnosis not present

## 2022-08-12 DIAGNOSIS — Z992 Dependence on renal dialysis: Secondary | ICD-10-CM | POA: Diagnosis not present

## 2022-08-13 DIAGNOSIS — Z992 Dependence on renal dialysis: Secondary | ICD-10-CM | POA: Diagnosis not present

## 2022-08-13 DIAGNOSIS — N186 End stage renal disease: Secondary | ICD-10-CM | POA: Diagnosis not present

## 2022-08-14 DIAGNOSIS — Z992 Dependence on renal dialysis: Secondary | ICD-10-CM | POA: Diagnosis not present

## 2022-08-14 DIAGNOSIS — N186 End stage renal disease: Secondary | ICD-10-CM | POA: Diagnosis not present

## 2022-08-15 DIAGNOSIS — N186 End stage renal disease: Secondary | ICD-10-CM | POA: Diagnosis not present

## 2022-08-15 DIAGNOSIS — Z992 Dependence on renal dialysis: Secondary | ICD-10-CM | POA: Diagnosis not present

## 2022-08-16 DIAGNOSIS — N186 End stage renal disease: Secondary | ICD-10-CM | POA: Diagnosis not present

## 2022-08-16 DIAGNOSIS — Z992 Dependence on renal dialysis: Secondary | ICD-10-CM | POA: Diagnosis not present

## 2022-08-17 DIAGNOSIS — Z992 Dependence on renal dialysis: Secondary | ICD-10-CM | POA: Diagnosis not present

## 2022-08-17 DIAGNOSIS — N186 End stage renal disease: Secondary | ICD-10-CM | POA: Diagnosis not present

## 2022-08-18 DIAGNOSIS — Z992 Dependence on renal dialysis: Secondary | ICD-10-CM | POA: Diagnosis not present

## 2022-08-18 DIAGNOSIS — N186 End stage renal disease: Secondary | ICD-10-CM | POA: Diagnosis not present

## 2022-08-19 DIAGNOSIS — Z992 Dependence on renal dialysis: Secondary | ICD-10-CM | POA: Diagnosis not present

## 2022-08-19 DIAGNOSIS — N186 End stage renal disease: Secondary | ICD-10-CM | POA: Diagnosis not present

## 2022-08-20 DIAGNOSIS — N186 End stage renal disease: Secondary | ICD-10-CM | POA: Diagnosis not present

## 2022-08-20 DIAGNOSIS — Z992 Dependence on renal dialysis: Secondary | ICD-10-CM | POA: Diagnosis not present

## 2022-08-21 DIAGNOSIS — N186 End stage renal disease: Secondary | ICD-10-CM | POA: Diagnosis not present

## 2022-08-21 DIAGNOSIS — Z992 Dependence on renal dialysis: Secondary | ICD-10-CM | POA: Diagnosis not present

## 2022-08-22 DIAGNOSIS — Z992 Dependence on renal dialysis: Secondary | ICD-10-CM | POA: Diagnosis not present

## 2022-08-22 DIAGNOSIS — N186 End stage renal disease: Secondary | ICD-10-CM | POA: Diagnosis not present

## 2022-08-23 DIAGNOSIS — N186 End stage renal disease: Secondary | ICD-10-CM | POA: Diagnosis not present

## 2022-08-23 DIAGNOSIS — Z992 Dependence on renal dialysis: Secondary | ICD-10-CM | POA: Diagnosis not present

## 2022-08-24 DIAGNOSIS — N186 End stage renal disease: Secondary | ICD-10-CM | POA: Diagnosis not present

## 2022-08-24 DIAGNOSIS — Z992 Dependence on renal dialysis: Secondary | ICD-10-CM | POA: Diagnosis not present

## 2022-08-25 DIAGNOSIS — N186 End stage renal disease: Secondary | ICD-10-CM | POA: Diagnosis not present

## 2022-08-25 DIAGNOSIS — Z992 Dependence on renal dialysis: Secondary | ICD-10-CM | POA: Diagnosis not present

## 2022-08-26 DIAGNOSIS — Z992 Dependence on renal dialysis: Secondary | ICD-10-CM | POA: Diagnosis not present

## 2022-08-26 DIAGNOSIS — N186 End stage renal disease: Secondary | ICD-10-CM | POA: Diagnosis not present

## 2022-08-27 DIAGNOSIS — Z992 Dependence on renal dialysis: Secondary | ICD-10-CM | POA: Diagnosis not present

## 2022-08-27 DIAGNOSIS — N186 End stage renal disease: Secondary | ICD-10-CM | POA: Diagnosis not present

## 2022-08-28 DIAGNOSIS — Z992 Dependence on renal dialysis: Secondary | ICD-10-CM | POA: Diagnosis not present

## 2022-08-28 DIAGNOSIS — N186 End stage renal disease: Secondary | ICD-10-CM | POA: Diagnosis not present

## 2022-08-29 DIAGNOSIS — Z992 Dependence on renal dialysis: Secondary | ICD-10-CM | POA: Diagnosis not present

## 2022-08-29 DIAGNOSIS — N186 End stage renal disease: Secondary | ICD-10-CM | POA: Diagnosis not present

## 2022-08-30 DIAGNOSIS — Z992 Dependence on renal dialysis: Secondary | ICD-10-CM | POA: Diagnosis not present

## 2022-08-30 DIAGNOSIS — N186 End stage renal disease: Secondary | ICD-10-CM | POA: Diagnosis not present

## 2022-08-31 DIAGNOSIS — N186 End stage renal disease: Secondary | ICD-10-CM | POA: Diagnosis not present

## 2022-08-31 DIAGNOSIS — Z992 Dependence on renal dialysis: Secondary | ICD-10-CM | POA: Diagnosis not present

## 2022-09-01 DIAGNOSIS — N186 End stage renal disease: Secondary | ICD-10-CM | POA: Diagnosis not present

## 2022-09-01 DIAGNOSIS — Z992 Dependence on renal dialysis: Secondary | ICD-10-CM | POA: Diagnosis not present

## 2022-09-02 DIAGNOSIS — Z992 Dependence on renal dialysis: Secondary | ICD-10-CM | POA: Diagnosis not present

## 2022-09-02 DIAGNOSIS — N186 End stage renal disease: Secondary | ICD-10-CM | POA: Diagnosis not present

## 2022-09-03 DIAGNOSIS — N186 End stage renal disease: Secondary | ICD-10-CM | POA: Diagnosis not present

## 2022-09-03 DIAGNOSIS — Z992 Dependence on renal dialysis: Secondary | ICD-10-CM | POA: Diagnosis not present

## 2022-09-04 DIAGNOSIS — N186 End stage renal disease: Secondary | ICD-10-CM | POA: Diagnosis not present

## 2022-09-04 DIAGNOSIS — Z992 Dependence on renal dialysis: Secondary | ICD-10-CM | POA: Diagnosis not present

## 2022-09-05 DIAGNOSIS — N186 End stage renal disease: Secondary | ICD-10-CM | POA: Diagnosis not present

## 2022-09-05 DIAGNOSIS — Z992 Dependence on renal dialysis: Secondary | ICD-10-CM | POA: Diagnosis not present

## 2022-09-06 DIAGNOSIS — N186 End stage renal disease: Secondary | ICD-10-CM | POA: Diagnosis not present

## 2022-09-06 DIAGNOSIS — Z992 Dependence on renal dialysis: Secondary | ICD-10-CM | POA: Diagnosis not present

## 2022-09-07 DIAGNOSIS — Z992 Dependence on renal dialysis: Secondary | ICD-10-CM | POA: Diagnosis not present

## 2022-09-07 DIAGNOSIS — N186 End stage renal disease: Secondary | ICD-10-CM | POA: Diagnosis not present

## 2022-09-08 DIAGNOSIS — Z992 Dependence on renal dialysis: Secondary | ICD-10-CM | POA: Diagnosis not present

## 2022-09-08 DIAGNOSIS — N186 End stage renal disease: Secondary | ICD-10-CM | POA: Diagnosis not present

## 2022-09-09 DIAGNOSIS — N186 End stage renal disease: Secondary | ICD-10-CM | POA: Diagnosis not present

## 2022-09-09 DIAGNOSIS — Z992 Dependence on renal dialysis: Secondary | ICD-10-CM | POA: Diagnosis not present

## 2022-09-10 DIAGNOSIS — N186 End stage renal disease: Secondary | ICD-10-CM | POA: Diagnosis not present

## 2022-09-10 DIAGNOSIS — Z992 Dependence on renal dialysis: Secondary | ICD-10-CM | POA: Diagnosis not present

## 2022-09-11 DIAGNOSIS — Z992 Dependence on renal dialysis: Secondary | ICD-10-CM | POA: Diagnosis not present

## 2022-09-11 DIAGNOSIS — N186 End stage renal disease: Secondary | ICD-10-CM | POA: Diagnosis not present

## 2022-09-13 DIAGNOSIS — N186 End stage renal disease: Secondary | ICD-10-CM | POA: Diagnosis not present

## 2022-09-13 DIAGNOSIS — Z992 Dependence on renal dialysis: Secondary | ICD-10-CM | POA: Diagnosis not present

## 2022-09-14 DIAGNOSIS — N186 End stage renal disease: Secondary | ICD-10-CM | POA: Diagnosis not present

## 2022-09-14 DIAGNOSIS — Z992 Dependence on renal dialysis: Secondary | ICD-10-CM | POA: Diagnosis not present

## 2022-09-15 DIAGNOSIS — Z992 Dependence on renal dialysis: Secondary | ICD-10-CM | POA: Diagnosis not present

## 2022-09-15 DIAGNOSIS — N186 End stage renal disease: Secondary | ICD-10-CM | POA: Diagnosis not present

## 2022-09-16 DIAGNOSIS — Z992 Dependence on renal dialysis: Secondary | ICD-10-CM | POA: Diagnosis not present

## 2022-09-16 DIAGNOSIS — N186 End stage renal disease: Secondary | ICD-10-CM | POA: Diagnosis not present

## 2022-09-17 DIAGNOSIS — N186 End stage renal disease: Secondary | ICD-10-CM | POA: Diagnosis not present

## 2022-09-17 DIAGNOSIS — Z992 Dependence on renal dialysis: Secondary | ICD-10-CM | POA: Diagnosis not present

## 2022-09-18 DIAGNOSIS — Z992 Dependence on renal dialysis: Secondary | ICD-10-CM | POA: Diagnosis not present

## 2022-09-18 DIAGNOSIS — N186 End stage renal disease: Secondary | ICD-10-CM | POA: Diagnosis not present

## 2022-09-19 DIAGNOSIS — N186 End stage renal disease: Secondary | ICD-10-CM | POA: Diagnosis not present

## 2022-09-19 DIAGNOSIS — Z992 Dependence on renal dialysis: Secondary | ICD-10-CM | POA: Diagnosis not present

## 2022-09-20 DIAGNOSIS — Z992 Dependence on renal dialysis: Secondary | ICD-10-CM | POA: Diagnosis not present

## 2022-09-20 DIAGNOSIS — N186 End stage renal disease: Secondary | ICD-10-CM | POA: Diagnosis not present

## 2022-09-21 DIAGNOSIS — N186 End stage renal disease: Secondary | ICD-10-CM | POA: Diagnosis not present

## 2022-09-21 DIAGNOSIS — Z992 Dependence on renal dialysis: Secondary | ICD-10-CM | POA: Diagnosis not present

## 2022-09-22 DIAGNOSIS — N186 End stage renal disease: Secondary | ICD-10-CM | POA: Diagnosis not present

## 2022-09-22 DIAGNOSIS — Z992 Dependence on renal dialysis: Secondary | ICD-10-CM | POA: Diagnosis not present

## 2022-09-23 DIAGNOSIS — N186 End stage renal disease: Secondary | ICD-10-CM | POA: Diagnosis not present

## 2022-09-23 DIAGNOSIS — Z992 Dependence on renal dialysis: Secondary | ICD-10-CM | POA: Diagnosis not present

## 2022-09-24 DIAGNOSIS — N186 End stage renal disease: Secondary | ICD-10-CM | POA: Diagnosis not present

## 2022-09-24 DIAGNOSIS — Z992 Dependence on renal dialysis: Secondary | ICD-10-CM | POA: Diagnosis not present

## 2022-09-25 DIAGNOSIS — Z992 Dependence on renal dialysis: Secondary | ICD-10-CM | POA: Diagnosis not present

## 2022-09-25 DIAGNOSIS — N186 End stage renal disease: Secondary | ICD-10-CM | POA: Diagnosis not present

## 2022-09-26 DIAGNOSIS — Z992 Dependence on renal dialysis: Secondary | ICD-10-CM | POA: Diagnosis not present

## 2022-09-26 DIAGNOSIS — N186 End stage renal disease: Secondary | ICD-10-CM | POA: Diagnosis not present

## 2022-09-27 DIAGNOSIS — Z992 Dependence on renal dialysis: Secondary | ICD-10-CM | POA: Diagnosis not present

## 2022-09-27 DIAGNOSIS — N186 End stage renal disease: Secondary | ICD-10-CM | POA: Diagnosis not present

## 2022-09-28 DIAGNOSIS — Z992 Dependence on renal dialysis: Secondary | ICD-10-CM | POA: Diagnosis not present

## 2022-09-28 DIAGNOSIS — N186 End stage renal disease: Secondary | ICD-10-CM | POA: Diagnosis not present

## 2022-09-29 DIAGNOSIS — Z992 Dependence on renal dialysis: Secondary | ICD-10-CM | POA: Diagnosis not present

## 2022-09-29 DIAGNOSIS — N186 End stage renal disease: Secondary | ICD-10-CM | POA: Diagnosis not present

## 2022-09-30 DIAGNOSIS — N186 End stage renal disease: Secondary | ICD-10-CM | POA: Diagnosis not present

## 2022-09-30 DIAGNOSIS — Z992 Dependence on renal dialysis: Secondary | ICD-10-CM | POA: Diagnosis not present

## 2022-10-01 DIAGNOSIS — N186 End stage renal disease: Secondary | ICD-10-CM | POA: Diagnosis not present

## 2022-10-01 DIAGNOSIS — Z992 Dependence on renal dialysis: Secondary | ICD-10-CM | POA: Diagnosis not present

## 2022-10-02 DIAGNOSIS — N186 End stage renal disease: Secondary | ICD-10-CM | POA: Diagnosis not present

## 2022-10-02 DIAGNOSIS — Z992 Dependence on renal dialysis: Secondary | ICD-10-CM | POA: Diagnosis not present

## 2022-10-03 DIAGNOSIS — N186 End stage renal disease: Secondary | ICD-10-CM | POA: Diagnosis not present

## 2022-10-03 DIAGNOSIS — Z992 Dependence on renal dialysis: Secondary | ICD-10-CM | POA: Diagnosis not present

## 2022-10-04 DIAGNOSIS — Z992 Dependence on renal dialysis: Secondary | ICD-10-CM | POA: Diagnosis not present

## 2022-10-04 DIAGNOSIS — N186 End stage renal disease: Secondary | ICD-10-CM | POA: Diagnosis not present

## 2022-10-05 DIAGNOSIS — N186 End stage renal disease: Secondary | ICD-10-CM | POA: Diagnosis not present

## 2022-10-05 DIAGNOSIS — Z992 Dependence on renal dialysis: Secondary | ICD-10-CM | POA: Diagnosis not present

## 2022-10-06 DIAGNOSIS — N186 End stage renal disease: Secondary | ICD-10-CM | POA: Diagnosis not present

## 2022-10-06 DIAGNOSIS — Z992 Dependence on renal dialysis: Secondary | ICD-10-CM | POA: Diagnosis not present

## 2022-10-07 DIAGNOSIS — Z992 Dependence on renal dialysis: Secondary | ICD-10-CM | POA: Diagnosis not present

## 2022-10-07 DIAGNOSIS — N186 End stage renal disease: Secondary | ICD-10-CM | POA: Diagnosis not present

## 2022-10-08 DIAGNOSIS — Z992 Dependence on renal dialysis: Secondary | ICD-10-CM | POA: Diagnosis not present

## 2022-10-08 DIAGNOSIS — N186 End stage renal disease: Secondary | ICD-10-CM | POA: Diagnosis not present

## 2022-10-09 DIAGNOSIS — Z992 Dependence on renal dialysis: Secondary | ICD-10-CM | POA: Diagnosis not present

## 2022-10-09 DIAGNOSIS — N186 End stage renal disease: Secondary | ICD-10-CM | POA: Diagnosis not present

## 2022-10-10 DIAGNOSIS — N186 End stage renal disease: Secondary | ICD-10-CM | POA: Diagnosis not present

## 2022-10-10 DIAGNOSIS — Z992 Dependence on renal dialysis: Secondary | ICD-10-CM | POA: Diagnosis not present

## 2022-10-11 DIAGNOSIS — N186 End stage renal disease: Secondary | ICD-10-CM | POA: Diagnosis not present

## 2022-10-11 DIAGNOSIS — Z992 Dependence on renal dialysis: Secondary | ICD-10-CM | POA: Diagnosis not present

## 2022-10-12 DIAGNOSIS — Z992 Dependence on renal dialysis: Secondary | ICD-10-CM | POA: Diagnosis not present

## 2022-10-12 DIAGNOSIS — N186 End stage renal disease: Secondary | ICD-10-CM | POA: Diagnosis not present

## 2022-10-13 DIAGNOSIS — Z992 Dependence on renal dialysis: Secondary | ICD-10-CM | POA: Diagnosis not present

## 2022-10-13 DIAGNOSIS — N186 End stage renal disease: Secondary | ICD-10-CM | POA: Diagnosis not present

## 2022-10-14 DIAGNOSIS — N186 End stage renal disease: Secondary | ICD-10-CM | POA: Diagnosis not present

## 2022-10-14 DIAGNOSIS — Z992 Dependence on renal dialysis: Secondary | ICD-10-CM | POA: Diagnosis not present

## 2022-10-15 DIAGNOSIS — Z992 Dependence on renal dialysis: Secondary | ICD-10-CM | POA: Diagnosis not present

## 2022-10-15 DIAGNOSIS — N186 End stage renal disease: Secondary | ICD-10-CM | POA: Diagnosis not present

## 2022-10-16 DIAGNOSIS — Z992 Dependence on renal dialysis: Secondary | ICD-10-CM | POA: Diagnosis not present

## 2022-10-16 DIAGNOSIS — N186 End stage renal disease: Secondary | ICD-10-CM | POA: Diagnosis not present

## 2022-10-17 DIAGNOSIS — N186 End stage renal disease: Secondary | ICD-10-CM | POA: Diagnosis not present

## 2022-10-17 DIAGNOSIS — Z992 Dependence on renal dialysis: Secondary | ICD-10-CM | POA: Diagnosis not present

## 2022-10-18 DIAGNOSIS — Z992 Dependence on renal dialysis: Secondary | ICD-10-CM | POA: Diagnosis not present

## 2022-10-18 DIAGNOSIS — N186 End stage renal disease: Secondary | ICD-10-CM | POA: Diagnosis not present

## 2022-10-19 DIAGNOSIS — Z992 Dependence on renal dialysis: Secondary | ICD-10-CM | POA: Diagnosis not present

## 2022-10-19 DIAGNOSIS — N186 End stage renal disease: Secondary | ICD-10-CM | POA: Diagnosis not present

## 2022-10-20 DIAGNOSIS — Z992 Dependence on renal dialysis: Secondary | ICD-10-CM | POA: Diagnosis not present

## 2022-10-20 DIAGNOSIS — N186 End stage renal disease: Secondary | ICD-10-CM | POA: Diagnosis not present

## 2022-10-21 DIAGNOSIS — N186 End stage renal disease: Secondary | ICD-10-CM | POA: Diagnosis not present

## 2022-10-21 DIAGNOSIS — Z992 Dependence on renal dialysis: Secondary | ICD-10-CM | POA: Diagnosis not present

## 2022-10-22 DIAGNOSIS — N186 End stage renal disease: Secondary | ICD-10-CM | POA: Diagnosis not present

## 2022-10-22 DIAGNOSIS — Z992 Dependence on renal dialysis: Secondary | ICD-10-CM | POA: Diagnosis not present

## 2022-10-23 DIAGNOSIS — N186 End stage renal disease: Secondary | ICD-10-CM | POA: Diagnosis not present

## 2022-10-23 DIAGNOSIS — Z992 Dependence on renal dialysis: Secondary | ICD-10-CM | POA: Diagnosis not present

## 2022-10-24 DIAGNOSIS — Z992 Dependence on renal dialysis: Secondary | ICD-10-CM | POA: Diagnosis not present

## 2022-10-24 DIAGNOSIS — N186 End stage renal disease: Secondary | ICD-10-CM | POA: Diagnosis not present

## 2022-10-25 DIAGNOSIS — Z992 Dependence on renal dialysis: Secondary | ICD-10-CM | POA: Diagnosis not present

## 2022-10-25 DIAGNOSIS — N186 End stage renal disease: Secondary | ICD-10-CM | POA: Diagnosis not present

## 2022-10-26 DIAGNOSIS — N186 End stage renal disease: Secondary | ICD-10-CM | POA: Diagnosis not present

## 2022-10-26 DIAGNOSIS — Z992 Dependence on renal dialysis: Secondary | ICD-10-CM | POA: Diagnosis not present

## 2022-10-27 DIAGNOSIS — Z992 Dependence on renal dialysis: Secondary | ICD-10-CM | POA: Diagnosis not present

## 2022-10-27 DIAGNOSIS — N186 End stage renal disease: Secondary | ICD-10-CM | POA: Diagnosis not present

## 2022-10-28 DIAGNOSIS — Z992 Dependence on renal dialysis: Secondary | ICD-10-CM | POA: Diagnosis not present

## 2022-10-28 DIAGNOSIS — N186 End stage renal disease: Secondary | ICD-10-CM | POA: Diagnosis not present

## 2022-10-29 DIAGNOSIS — Z992 Dependence on renal dialysis: Secondary | ICD-10-CM | POA: Diagnosis not present

## 2022-10-29 DIAGNOSIS — N186 End stage renal disease: Secondary | ICD-10-CM | POA: Diagnosis not present

## 2022-10-30 DIAGNOSIS — Z992 Dependence on renal dialysis: Secondary | ICD-10-CM | POA: Diagnosis not present

## 2022-10-30 DIAGNOSIS — N186 End stage renal disease: Secondary | ICD-10-CM | POA: Diagnosis not present

## 2022-10-31 DIAGNOSIS — Z992 Dependence on renal dialysis: Secondary | ICD-10-CM | POA: Diagnosis not present

## 2022-10-31 DIAGNOSIS — N186 End stage renal disease: Secondary | ICD-10-CM | POA: Diagnosis not present

## 2022-11-01 DIAGNOSIS — N186 End stage renal disease: Secondary | ICD-10-CM | POA: Diagnosis not present

## 2022-11-01 DIAGNOSIS — Z992 Dependence on renal dialysis: Secondary | ICD-10-CM | POA: Diagnosis not present

## 2022-11-02 DIAGNOSIS — N186 End stage renal disease: Secondary | ICD-10-CM | POA: Diagnosis not present

## 2022-11-02 DIAGNOSIS — Z992 Dependence on renal dialysis: Secondary | ICD-10-CM | POA: Diagnosis not present

## 2022-11-03 DIAGNOSIS — Z992 Dependence on renal dialysis: Secondary | ICD-10-CM | POA: Diagnosis not present

## 2022-11-03 DIAGNOSIS — N186 End stage renal disease: Secondary | ICD-10-CM | POA: Diagnosis not present

## 2022-11-04 DIAGNOSIS — Z992 Dependence on renal dialysis: Secondary | ICD-10-CM | POA: Diagnosis not present

## 2022-11-04 DIAGNOSIS — N186 End stage renal disease: Secondary | ICD-10-CM | POA: Diagnosis not present

## 2022-11-05 DIAGNOSIS — N186 End stage renal disease: Secondary | ICD-10-CM | POA: Diagnosis not present

## 2022-11-05 DIAGNOSIS — Z992 Dependence on renal dialysis: Secondary | ICD-10-CM | POA: Diagnosis not present

## 2022-11-06 DIAGNOSIS — Z992 Dependence on renal dialysis: Secondary | ICD-10-CM | POA: Diagnosis not present

## 2022-11-06 DIAGNOSIS — N186 End stage renal disease: Secondary | ICD-10-CM | POA: Diagnosis not present

## 2022-11-07 DIAGNOSIS — Z992 Dependence on renal dialysis: Secondary | ICD-10-CM | POA: Diagnosis not present

## 2022-11-07 DIAGNOSIS — N186 End stage renal disease: Secondary | ICD-10-CM | POA: Diagnosis not present

## 2022-11-08 DIAGNOSIS — N186 End stage renal disease: Secondary | ICD-10-CM | POA: Diagnosis not present

## 2022-11-08 DIAGNOSIS — Z794 Long term (current) use of insulin: Secondary | ICD-10-CM | POA: Diagnosis not present

## 2022-11-08 DIAGNOSIS — Z992 Dependence on renal dialysis: Secondary | ICD-10-CM | POA: Diagnosis not present

## 2022-11-08 DIAGNOSIS — E119 Type 2 diabetes mellitus without complications: Secondary | ICD-10-CM | POA: Diagnosis not present

## 2022-11-09 DIAGNOSIS — N186 End stage renal disease: Secondary | ICD-10-CM | POA: Diagnosis not present

## 2022-11-09 DIAGNOSIS — Z992 Dependence on renal dialysis: Secondary | ICD-10-CM | POA: Diagnosis not present

## 2022-11-10 DIAGNOSIS — N186 End stage renal disease: Secondary | ICD-10-CM | POA: Diagnosis not present

## 2022-11-10 DIAGNOSIS — Z992 Dependence on renal dialysis: Secondary | ICD-10-CM | POA: Diagnosis not present

## 2022-11-11 DIAGNOSIS — Z992 Dependence on renal dialysis: Secondary | ICD-10-CM | POA: Diagnosis not present

## 2022-11-11 DIAGNOSIS — N186 End stage renal disease: Secondary | ICD-10-CM | POA: Diagnosis not present

## 2022-11-12 DIAGNOSIS — N186 End stage renal disease: Secondary | ICD-10-CM | POA: Diagnosis not present

## 2022-11-12 DIAGNOSIS — Z992 Dependence on renal dialysis: Secondary | ICD-10-CM | POA: Diagnosis not present

## 2022-11-13 DIAGNOSIS — N186 End stage renal disease: Secondary | ICD-10-CM | POA: Diagnosis not present

## 2022-11-13 DIAGNOSIS — Z992 Dependence on renal dialysis: Secondary | ICD-10-CM | POA: Diagnosis not present

## 2022-11-14 DIAGNOSIS — Z992 Dependence on renal dialysis: Secondary | ICD-10-CM | POA: Diagnosis not present

## 2022-11-14 DIAGNOSIS — N186 End stage renal disease: Secondary | ICD-10-CM | POA: Diagnosis not present

## 2022-11-15 DIAGNOSIS — N186 End stage renal disease: Secondary | ICD-10-CM | POA: Diagnosis not present

## 2022-11-15 DIAGNOSIS — Z992 Dependence on renal dialysis: Secondary | ICD-10-CM | POA: Diagnosis not present

## 2022-11-16 DIAGNOSIS — Z992 Dependence on renal dialysis: Secondary | ICD-10-CM | POA: Diagnosis not present

## 2022-11-16 DIAGNOSIS — N186 End stage renal disease: Secondary | ICD-10-CM | POA: Diagnosis not present

## 2022-11-17 DIAGNOSIS — Z992 Dependence on renal dialysis: Secondary | ICD-10-CM | POA: Diagnosis not present

## 2022-11-17 DIAGNOSIS — N186 End stage renal disease: Secondary | ICD-10-CM | POA: Diagnosis not present

## 2022-11-18 DIAGNOSIS — Z992 Dependence on renal dialysis: Secondary | ICD-10-CM | POA: Diagnosis not present

## 2022-11-18 DIAGNOSIS — N186 End stage renal disease: Secondary | ICD-10-CM | POA: Diagnosis not present

## 2022-11-19 DIAGNOSIS — N186 End stage renal disease: Secondary | ICD-10-CM | POA: Diagnosis not present

## 2022-11-19 DIAGNOSIS — Z992 Dependence on renal dialysis: Secondary | ICD-10-CM | POA: Diagnosis not present

## 2022-11-20 DIAGNOSIS — Z992 Dependence on renal dialysis: Secondary | ICD-10-CM | POA: Diagnosis not present

## 2022-11-20 DIAGNOSIS — N186 End stage renal disease: Secondary | ICD-10-CM | POA: Diagnosis not present

## 2022-11-21 DIAGNOSIS — N186 End stage renal disease: Secondary | ICD-10-CM | POA: Diagnosis not present

## 2022-11-21 DIAGNOSIS — Z992 Dependence on renal dialysis: Secondary | ICD-10-CM | POA: Diagnosis not present

## 2022-11-22 DIAGNOSIS — N186 End stage renal disease: Secondary | ICD-10-CM | POA: Diagnosis not present

## 2022-11-22 DIAGNOSIS — Z992 Dependence on renal dialysis: Secondary | ICD-10-CM | POA: Diagnosis not present

## 2022-11-23 DIAGNOSIS — N186 End stage renal disease: Secondary | ICD-10-CM | POA: Diagnosis not present

## 2022-11-23 DIAGNOSIS — Z992 Dependence on renal dialysis: Secondary | ICD-10-CM | POA: Diagnosis not present

## 2022-11-24 DIAGNOSIS — Z992 Dependence on renal dialysis: Secondary | ICD-10-CM | POA: Diagnosis not present

## 2022-11-24 DIAGNOSIS — N186 End stage renal disease: Secondary | ICD-10-CM | POA: Diagnosis not present

## 2022-11-25 DIAGNOSIS — Z992 Dependence on renal dialysis: Secondary | ICD-10-CM | POA: Diagnosis not present

## 2022-11-25 DIAGNOSIS — N186 End stage renal disease: Secondary | ICD-10-CM | POA: Diagnosis not present

## 2022-11-26 DIAGNOSIS — Z992 Dependence on renal dialysis: Secondary | ICD-10-CM | POA: Diagnosis not present

## 2022-11-26 DIAGNOSIS — N186 End stage renal disease: Secondary | ICD-10-CM | POA: Diagnosis not present

## 2022-11-27 DIAGNOSIS — N186 End stage renal disease: Secondary | ICD-10-CM | POA: Diagnosis not present

## 2022-11-27 DIAGNOSIS — Z992 Dependence on renal dialysis: Secondary | ICD-10-CM | POA: Diagnosis not present

## 2022-11-28 DIAGNOSIS — Z992 Dependence on renal dialysis: Secondary | ICD-10-CM | POA: Diagnosis not present

## 2022-11-28 DIAGNOSIS — N186 End stage renal disease: Secondary | ICD-10-CM | POA: Diagnosis not present

## 2022-11-29 DIAGNOSIS — Z992 Dependence on renal dialysis: Secondary | ICD-10-CM | POA: Diagnosis not present

## 2022-11-29 DIAGNOSIS — N186 End stage renal disease: Secondary | ICD-10-CM | POA: Diagnosis not present

## 2022-11-30 DIAGNOSIS — N186 End stage renal disease: Secondary | ICD-10-CM | POA: Diagnosis not present

## 2022-11-30 DIAGNOSIS — Z992 Dependence on renal dialysis: Secondary | ICD-10-CM | POA: Diagnosis not present

## 2022-12-01 DIAGNOSIS — N186 End stage renal disease: Secondary | ICD-10-CM | POA: Diagnosis not present

## 2022-12-01 DIAGNOSIS — Z992 Dependence on renal dialysis: Secondary | ICD-10-CM | POA: Diagnosis not present

## 2022-12-02 DIAGNOSIS — Z992 Dependence on renal dialysis: Secondary | ICD-10-CM | POA: Diagnosis not present

## 2022-12-02 DIAGNOSIS — N186 End stage renal disease: Secondary | ICD-10-CM | POA: Diagnosis not present

## 2022-12-03 DIAGNOSIS — Z992 Dependence on renal dialysis: Secondary | ICD-10-CM | POA: Diagnosis not present

## 2022-12-03 DIAGNOSIS — N186 End stage renal disease: Secondary | ICD-10-CM | POA: Diagnosis not present

## 2022-12-04 DIAGNOSIS — Z992 Dependence on renal dialysis: Secondary | ICD-10-CM | POA: Diagnosis not present

## 2022-12-04 DIAGNOSIS — N186 End stage renal disease: Secondary | ICD-10-CM | POA: Diagnosis not present

## 2022-12-05 DIAGNOSIS — N186 End stage renal disease: Secondary | ICD-10-CM | POA: Diagnosis not present

## 2022-12-05 DIAGNOSIS — Z992 Dependence on renal dialysis: Secondary | ICD-10-CM | POA: Diagnosis not present

## 2022-12-06 DIAGNOSIS — Z992 Dependence on renal dialysis: Secondary | ICD-10-CM | POA: Diagnosis not present

## 2022-12-06 DIAGNOSIS — N186 End stage renal disease: Secondary | ICD-10-CM | POA: Diagnosis not present

## 2022-12-07 DIAGNOSIS — Z992 Dependence on renal dialysis: Secondary | ICD-10-CM | POA: Diagnosis not present

## 2022-12-07 DIAGNOSIS — N186 End stage renal disease: Secondary | ICD-10-CM | POA: Diagnosis not present

## 2022-12-08 DIAGNOSIS — N186 End stage renal disease: Secondary | ICD-10-CM | POA: Diagnosis not present

## 2022-12-08 DIAGNOSIS — Z992 Dependence on renal dialysis: Secondary | ICD-10-CM | POA: Diagnosis not present

## 2022-12-09 DIAGNOSIS — N186 End stage renal disease: Secondary | ICD-10-CM | POA: Diagnosis not present

## 2022-12-09 DIAGNOSIS — Z992 Dependence on renal dialysis: Secondary | ICD-10-CM | POA: Diagnosis not present

## 2022-12-10 DIAGNOSIS — Z992 Dependence on renal dialysis: Secondary | ICD-10-CM | POA: Diagnosis not present

## 2022-12-10 DIAGNOSIS — N186 End stage renal disease: Secondary | ICD-10-CM | POA: Diagnosis not present

## 2022-12-11 DIAGNOSIS — Z992 Dependence on renal dialysis: Secondary | ICD-10-CM | POA: Diagnosis not present

## 2022-12-11 DIAGNOSIS — N186 End stage renal disease: Secondary | ICD-10-CM | POA: Diagnosis not present

## 2022-12-12 DIAGNOSIS — N186 End stage renal disease: Secondary | ICD-10-CM | POA: Diagnosis not present

## 2022-12-12 DIAGNOSIS — Z992 Dependence on renal dialysis: Secondary | ICD-10-CM | POA: Diagnosis not present

## 2022-12-13 DIAGNOSIS — Z992 Dependence on renal dialysis: Secondary | ICD-10-CM | POA: Diagnosis not present

## 2022-12-13 DIAGNOSIS — N186 End stage renal disease: Secondary | ICD-10-CM | POA: Diagnosis not present

## 2022-12-14 DIAGNOSIS — N186 End stage renal disease: Secondary | ICD-10-CM | POA: Diagnosis not present

## 2022-12-14 DIAGNOSIS — Z992 Dependence on renal dialysis: Secondary | ICD-10-CM | POA: Diagnosis not present

## 2022-12-15 DIAGNOSIS — N186 End stage renal disease: Secondary | ICD-10-CM | POA: Diagnosis not present

## 2022-12-15 DIAGNOSIS — Z992 Dependence on renal dialysis: Secondary | ICD-10-CM | POA: Diagnosis not present

## 2022-12-16 DIAGNOSIS — N186 End stage renal disease: Secondary | ICD-10-CM | POA: Diagnosis not present

## 2022-12-16 DIAGNOSIS — Z992 Dependence on renal dialysis: Secondary | ICD-10-CM | POA: Diagnosis not present

## 2022-12-17 DIAGNOSIS — N186 End stage renal disease: Secondary | ICD-10-CM | POA: Diagnosis not present

## 2022-12-17 DIAGNOSIS — Z992 Dependence on renal dialysis: Secondary | ICD-10-CM | POA: Diagnosis not present

## 2022-12-18 DIAGNOSIS — Z992 Dependence on renal dialysis: Secondary | ICD-10-CM | POA: Diagnosis not present

## 2022-12-18 DIAGNOSIS — N186 End stage renal disease: Secondary | ICD-10-CM | POA: Diagnosis not present

## 2022-12-19 DIAGNOSIS — N186 End stage renal disease: Secondary | ICD-10-CM | POA: Diagnosis not present

## 2022-12-19 DIAGNOSIS — Z992 Dependence on renal dialysis: Secondary | ICD-10-CM | POA: Diagnosis not present

## 2022-12-20 DIAGNOSIS — N186 End stage renal disease: Secondary | ICD-10-CM | POA: Diagnosis not present

## 2022-12-20 DIAGNOSIS — Z992 Dependence on renal dialysis: Secondary | ICD-10-CM | POA: Diagnosis not present

## 2022-12-21 DIAGNOSIS — Z992 Dependence on renal dialysis: Secondary | ICD-10-CM | POA: Diagnosis not present

## 2022-12-21 DIAGNOSIS — N186 End stage renal disease: Secondary | ICD-10-CM | POA: Diagnosis not present

## 2022-12-22 ENCOUNTER — Other Ambulatory Visit: Payer: Self-pay | Admitting: Interventional Radiology

## 2022-12-22 DIAGNOSIS — N186 End stage renal disease: Secondary | ICD-10-CM | POA: Diagnosis not present

## 2022-12-22 DIAGNOSIS — N2889 Other specified disorders of kidney and ureter: Secondary | ICD-10-CM

## 2022-12-22 DIAGNOSIS — Z992 Dependence on renal dialysis: Secondary | ICD-10-CM | POA: Diagnosis not present

## 2022-12-23 DIAGNOSIS — Z992 Dependence on renal dialysis: Secondary | ICD-10-CM | POA: Diagnosis not present

## 2022-12-23 DIAGNOSIS — N186 End stage renal disease: Secondary | ICD-10-CM | POA: Diagnosis not present

## 2022-12-24 DIAGNOSIS — N186 End stage renal disease: Secondary | ICD-10-CM | POA: Diagnosis not present

## 2022-12-24 DIAGNOSIS — Z992 Dependence on renal dialysis: Secondary | ICD-10-CM | POA: Diagnosis not present

## 2022-12-25 DIAGNOSIS — N186 End stage renal disease: Secondary | ICD-10-CM | POA: Diagnosis not present

## 2022-12-25 DIAGNOSIS — Z992 Dependence on renal dialysis: Secondary | ICD-10-CM | POA: Diagnosis not present

## 2022-12-26 DIAGNOSIS — Z992 Dependence on renal dialysis: Secondary | ICD-10-CM | POA: Diagnosis not present

## 2022-12-26 DIAGNOSIS — N186 End stage renal disease: Secondary | ICD-10-CM | POA: Diagnosis not present

## 2022-12-27 DIAGNOSIS — N186 End stage renal disease: Secondary | ICD-10-CM | POA: Diagnosis not present

## 2022-12-27 DIAGNOSIS — Z992 Dependence on renal dialysis: Secondary | ICD-10-CM | POA: Diagnosis not present

## 2022-12-28 DIAGNOSIS — Z992 Dependence on renal dialysis: Secondary | ICD-10-CM | POA: Diagnosis not present

## 2022-12-28 DIAGNOSIS — N186 End stage renal disease: Secondary | ICD-10-CM | POA: Diagnosis not present

## 2022-12-29 DIAGNOSIS — N186 End stage renal disease: Secondary | ICD-10-CM | POA: Diagnosis not present

## 2022-12-29 DIAGNOSIS — Z992 Dependence on renal dialysis: Secondary | ICD-10-CM | POA: Diagnosis not present

## 2022-12-30 DIAGNOSIS — N186 End stage renal disease: Secondary | ICD-10-CM | POA: Diagnosis not present

## 2022-12-30 DIAGNOSIS — Z992 Dependence on renal dialysis: Secondary | ICD-10-CM | POA: Diagnosis not present

## 2022-12-31 DIAGNOSIS — N186 End stage renal disease: Secondary | ICD-10-CM | POA: Diagnosis not present

## 2022-12-31 DIAGNOSIS — Z992 Dependence on renal dialysis: Secondary | ICD-10-CM | POA: Diagnosis not present

## 2023-01-01 DIAGNOSIS — N186 End stage renal disease: Secondary | ICD-10-CM | POA: Diagnosis not present

## 2023-01-01 DIAGNOSIS — Z992 Dependence on renal dialysis: Secondary | ICD-10-CM | POA: Diagnosis not present

## 2023-01-02 DIAGNOSIS — Z992 Dependence on renal dialysis: Secondary | ICD-10-CM | POA: Diagnosis not present

## 2023-01-02 DIAGNOSIS — N186 End stage renal disease: Secondary | ICD-10-CM | POA: Diagnosis not present

## 2023-01-03 DIAGNOSIS — N186 End stage renal disease: Secondary | ICD-10-CM | POA: Diagnosis not present

## 2023-01-03 DIAGNOSIS — Z992 Dependence on renal dialysis: Secondary | ICD-10-CM | POA: Diagnosis not present

## 2023-01-04 DIAGNOSIS — N186 End stage renal disease: Secondary | ICD-10-CM | POA: Diagnosis not present

## 2023-01-04 DIAGNOSIS — Z992 Dependence on renal dialysis: Secondary | ICD-10-CM | POA: Diagnosis not present

## 2023-01-05 DIAGNOSIS — N186 End stage renal disease: Secondary | ICD-10-CM | POA: Diagnosis not present

## 2023-01-05 DIAGNOSIS — Z992 Dependence on renal dialysis: Secondary | ICD-10-CM | POA: Diagnosis not present

## 2023-01-06 DIAGNOSIS — Z992 Dependence on renal dialysis: Secondary | ICD-10-CM | POA: Diagnosis not present

## 2023-01-06 DIAGNOSIS — N186 End stage renal disease: Secondary | ICD-10-CM | POA: Diagnosis not present

## 2023-01-07 DIAGNOSIS — Z992 Dependence on renal dialysis: Secondary | ICD-10-CM | POA: Diagnosis not present

## 2023-01-07 DIAGNOSIS — N186 End stage renal disease: Secondary | ICD-10-CM | POA: Diagnosis not present

## 2023-01-08 DIAGNOSIS — Z992 Dependence on renal dialysis: Secondary | ICD-10-CM | POA: Diagnosis not present

## 2023-01-08 DIAGNOSIS — N186 End stage renal disease: Secondary | ICD-10-CM | POA: Diagnosis not present

## 2023-01-09 DIAGNOSIS — Z992 Dependence on renal dialysis: Secondary | ICD-10-CM | POA: Diagnosis not present

## 2023-01-09 DIAGNOSIS — N186 End stage renal disease: Secondary | ICD-10-CM | POA: Diagnosis not present

## 2023-01-10 ENCOUNTER — Encounter: Payer: Self-pay | Admitting: Interventional Radiology

## 2023-01-10 DIAGNOSIS — Z992 Dependence on renal dialysis: Secondary | ICD-10-CM | POA: Diagnosis not present

## 2023-01-10 DIAGNOSIS — N186 End stage renal disease: Secondary | ICD-10-CM | POA: Diagnosis not present

## 2023-01-11 DIAGNOSIS — Z992 Dependence on renal dialysis: Secondary | ICD-10-CM | POA: Diagnosis not present

## 2023-01-11 DIAGNOSIS — N186 End stage renal disease: Secondary | ICD-10-CM | POA: Diagnosis not present

## 2023-01-12 DIAGNOSIS — N186 End stage renal disease: Secondary | ICD-10-CM | POA: Diagnosis not present

## 2023-01-12 DIAGNOSIS — Z992 Dependence on renal dialysis: Secondary | ICD-10-CM | POA: Diagnosis not present

## 2023-01-13 DIAGNOSIS — Z992 Dependence on renal dialysis: Secondary | ICD-10-CM | POA: Diagnosis not present

## 2023-01-13 DIAGNOSIS — N186 End stage renal disease: Secondary | ICD-10-CM | POA: Diagnosis not present

## 2023-01-14 DIAGNOSIS — N186 End stage renal disease: Secondary | ICD-10-CM | POA: Diagnosis not present

## 2023-01-14 DIAGNOSIS — Z992 Dependence on renal dialysis: Secondary | ICD-10-CM | POA: Diagnosis not present

## 2023-01-15 DIAGNOSIS — Z992 Dependence on renal dialysis: Secondary | ICD-10-CM | POA: Diagnosis not present

## 2023-01-15 DIAGNOSIS — N186 End stage renal disease: Secondary | ICD-10-CM | POA: Diagnosis not present

## 2023-01-16 DIAGNOSIS — N186 End stage renal disease: Secondary | ICD-10-CM | POA: Diagnosis not present

## 2023-01-16 DIAGNOSIS — Z992 Dependence on renal dialysis: Secondary | ICD-10-CM | POA: Diagnosis not present

## 2023-01-17 DIAGNOSIS — N186 End stage renal disease: Secondary | ICD-10-CM | POA: Diagnosis not present

## 2023-01-17 DIAGNOSIS — Z992 Dependence on renal dialysis: Secondary | ICD-10-CM | POA: Diagnosis not present

## 2023-01-18 DIAGNOSIS — N186 End stage renal disease: Secondary | ICD-10-CM | POA: Diagnosis not present

## 2023-01-18 DIAGNOSIS — Z992 Dependence on renal dialysis: Secondary | ICD-10-CM | POA: Diagnosis not present

## 2023-01-19 DIAGNOSIS — Z992 Dependence on renal dialysis: Secondary | ICD-10-CM | POA: Diagnosis not present

## 2023-01-19 DIAGNOSIS — N186 End stage renal disease: Secondary | ICD-10-CM | POA: Diagnosis not present

## 2023-01-20 DIAGNOSIS — N186 End stage renal disease: Secondary | ICD-10-CM | POA: Diagnosis not present

## 2023-01-20 DIAGNOSIS — Z992 Dependence on renal dialysis: Secondary | ICD-10-CM | POA: Diagnosis not present

## 2023-01-21 DIAGNOSIS — Z992 Dependence on renal dialysis: Secondary | ICD-10-CM | POA: Diagnosis not present

## 2023-01-21 DIAGNOSIS — N186 End stage renal disease: Secondary | ICD-10-CM | POA: Diagnosis not present

## 2023-01-22 DIAGNOSIS — N186 End stage renal disease: Secondary | ICD-10-CM | POA: Diagnosis not present

## 2023-01-22 DIAGNOSIS — Z992 Dependence on renal dialysis: Secondary | ICD-10-CM | POA: Diagnosis not present

## 2023-01-23 DIAGNOSIS — N186 End stage renal disease: Secondary | ICD-10-CM | POA: Diagnosis not present

## 2023-01-23 DIAGNOSIS — Z992 Dependence on renal dialysis: Secondary | ICD-10-CM | POA: Diagnosis not present

## 2023-01-24 DIAGNOSIS — Z992 Dependence on renal dialysis: Secondary | ICD-10-CM | POA: Diagnosis not present

## 2023-01-24 DIAGNOSIS — N186 End stage renal disease: Secondary | ICD-10-CM | POA: Diagnosis not present

## 2023-01-25 DIAGNOSIS — N186 End stage renal disease: Secondary | ICD-10-CM | POA: Diagnosis not present

## 2023-01-25 DIAGNOSIS — Z992 Dependence on renal dialysis: Secondary | ICD-10-CM | POA: Diagnosis not present

## 2023-01-26 DIAGNOSIS — Z992 Dependence on renal dialysis: Secondary | ICD-10-CM | POA: Diagnosis not present

## 2023-01-26 DIAGNOSIS — N186 End stage renal disease: Secondary | ICD-10-CM | POA: Diagnosis not present

## 2023-01-27 DIAGNOSIS — Z992 Dependence on renal dialysis: Secondary | ICD-10-CM | POA: Diagnosis not present

## 2023-01-27 DIAGNOSIS — N186 End stage renal disease: Secondary | ICD-10-CM | POA: Diagnosis not present

## 2023-01-28 DIAGNOSIS — Z992 Dependence on renal dialysis: Secondary | ICD-10-CM | POA: Diagnosis not present

## 2023-01-28 DIAGNOSIS — N186 End stage renal disease: Secondary | ICD-10-CM | POA: Diagnosis not present

## 2023-01-29 DIAGNOSIS — N186 End stage renal disease: Secondary | ICD-10-CM | POA: Diagnosis not present

## 2023-01-29 DIAGNOSIS — Z992 Dependence on renal dialysis: Secondary | ICD-10-CM | POA: Diagnosis not present

## 2023-01-30 DIAGNOSIS — N186 End stage renal disease: Secondary | ICD-10-CM | POA: Diagnosis not present

## 2023-01-30 DIAGNOSIS — Z992 Dependence on renal dialysis: Secondary | ICD-10-CM | POA: Diagnosis not present

## 2023-01-31 DIAGNOSIS — N186 End stage renal disease: Secondary | ICD-10-CM | POA: Diagnosis not present

## 2023-01-31 DIAGNOSIS — L03012 Cellulitis of left finger: Secondary | ICD-10-CM | POA: Diagnosis not present

## 2023-01-31 DIAGNOSIS — Z992 Dependence on renal dialysis: Secondary | ICD-10-CM | POA: Diagnosis not present

## 2023-02-01 DIAGNOSIS — N186 End stage renal disease: Secondary | ICD-10-CM | POA: Diagnosis not present

## 2023-02-01 DIAGNOSIS — Z992 Dependence on renal dialysis: Secondary | ICD-10-CM | POA: Diagnosis not present

## 2023-02-02 DIAGNOSIS — Z992 Dependence on renal dialysis: Secondary | ICD-10-CM | POA: Diagnosis not present

## 2023-02-02 DIAGNOSIS — N186 End stage renal disease: Secondary | ICD-10-CM | POA: Diagnosis not present

## 2023-02-03 DIAGNOSIS — Z992 Dependence on renal dialysis: Secondary | ICD-10-CM | POA: Diagnosis not present

## 2023-02-03 DIAGNOSIS — N186 End stage renal disease: Secondary | ICD-10-CM | POA: Diagnosis not present

## 2023-02-04 DIAGNOSIS — Z992 Dependence on renal dialysis: Secondary | ICD-10-CM | POA: Diagnosis not present

## 2023-02-04 DIAGNOSIS — N186 End stage renal disease: Secondary | ICD-10-CM | POA: Diagnosis not present

## 2023-02-05 DIAGNOSIS — N186 End stage renal disease: Secondary | ICD-10-CM | POA: Diagnosis not present

## 2023-02-05 DIAGNOSIS — Z992 Dependence on renal dialysis: Secondary | ICD-10-CM | POA: Diagnosis not present

## 2023-02-06 DIAGNOSIS — N186 End stage renal disease: Secondary | ICD-10-CM | POA: Diagnosis not present

## 2023-02-06 DIAGNOSIS — Z992 Dependence on renal dialysis: Secondary | ICD-10-CM | POA: Diagnosis not present

## 2023-02-07 DIAGNOSIS — Z992 Dependence on renal dialysis: Secondary | ICD-10-CM | POA: Diagnosis not present

## 2023-02-07 DIAGNOSIS — N186 End stage renal disease: Secondary | ICD-10-CM | POA: Diagnosis not present

## 2023-02-08 ENCOUNTER — Ambulatory Visit
Admission: RE | Admit: 2023-02-08 | Discharge: 2023-02-08 | Disposition: A | Discharge status: Home / Self Care | Payer: Medicare HMO | Source: Ambulatory Visit | Attending: Interventional Radiology

## 2023-02-08 DIAGNOSIS — E119 Type 2 diabetes mellitus without complications: Secondary | ICD-10-CM | POA: Diagnosis not present

## 2023-02-08 DIAGNOSIS — Z794 Long term (current) use of insulin: Secondary | ICD-10-CM | POA: Diagnosis not present

## 2023-02-08 DIAGNOSIS — N2889 Other specified disorders of kidney and ureter: Secondary | ICD-10-CM

## 2023-02-08 DIAGNOSIS — I700 Atherosclerosis of aorta: Secondary | ICD-10-CM | POA: Diagnosis not present

## 2023-02-08 DIAGNOSIS — K449 Diaphragmatic hernia without obstruction or gangrene: Secondary | ICD-10-CM | POA: Diagnosis not present

## 2023-02-08 DIAGNOSIS — R188 Other ascites: Secondary | ICD-10-CM | POA: Diagnosis not present

## 2023-02-08 DIAGNOSIS — Z5181 Encounter for therapeutic drug level monitoring: Secondary | ICD-10-CM | POA: Diagnosis not present

## 2023-02-08 DIAGNOSIS — Z79899 Other long term (current) drug therapy: Secondary | ICD-10-CM | POA: Diagnosis not present

## 2023-02-08 DIAGNOSIS — N186 End stage renal disease: Secondary | ICD-10-CM | POA: Diagnosis not present

## 2023-02-08 DIAGNOSIS — N289 Disorder of kidney and ureter, unspecified: Secondary | ICD-10-CM | POA: Diagnosis not present

## 2023-02-08 DIAGNOSIS — E785 Hyperlipidemia, unspecified: Secondary | ICD-10-CM | POA: Diagnosis not present

## 2023-02-08 DIAGNOSIS — Z992 Dependence on renal dialysis: Secondary | ICD-10-CM | POA: Diagnosis not present

## 2023-02-08 DIAGNOSIS — K8020 Calculus of gallbladder without cholecystitis without obstruction: Secondary | ICD-10-CM | POA: Diagnosis not present

## 2023-02-08 MED ORDER — IOPAMIDOL (ISOVUE-370) INJECTION 76%
100.0000 mL | Freq: Once | INTRAVENOUS | Status: AC | PRN
  Administered 2023-02-08: 100 mL via INTRAVENOUS

## 2023-02-09 DIAGNOSIS — N186 End stage renal disease: Secondary | ICD-10-CM | POA: Diagnosis not present

## 2023-02-09 DIAGNOSIS — Z992 Dependence on renal dialysis: Secondary | ICD-10-CM | POA: Diagnosis not present

## 2023-02-10 ENCOUNTER — Other Ambulatory Visit: Payer: Self-pay | Admitting: Interventional Radiology

## 2023-02-10 DIAGNOSIS — N2889 Other specified disorders of kidney and ureter: Secondary | ICD-10-CM

## 2023-02-10 DIAGNOSIS — N186 End stage renal disease: Secondary | ICD-10-CM | POA: Diagnosis not present

## 2023-02-10 DIAGNOSIS — Z992 Dependence on renal dialysis: Secondary | ICD-10-CM | POA: Diagnosis not present

## 2023-02-11 DIAGNOSIS — Z992 Dependence on renal dialysis: Secondary | ICD-10-CM | POA: Diagnosis not present

## 2023-02-11 DIAGNOSIS — N186 End stage renal disease: Secondary | ICD-10-CM | POA: Diagnosis not present

## 2023-02-12 DIAGNOSIS — N186 End stage renal disease: Secondary | ICD-10-CM | POA: Diagnosis not present

## 2023-02-12 DIAGNOSIS — Z992 Dependence on renal dialysis: Secondary | ICD-10-CM | POA: Diagnosis not present

## 2023-02-13 DIAGNOSIS — Z992 Dependence on renal dialysis: Secondary | ICD-10-CM | POA: Diagnosis not present

## 2023-02-13 DIAGNOSIS — N186 End stage renal disease: Secondary | ICD-10-CM | POA: Diagnosis not present

## 2023-02-14 DIAGNOSIS — N186 End stage renal disease: Secondary | ICD-10-CM | POA: Diagnosis not present

## 2023-02-14 DIAGNOSIS — Z992 Dependence on renal dialysis: Secondary | ICD-10-CM | POA: Diagnosis not present

## 2023-02-15 DIAGNOSIS — Z992 Dependence on renal dialysis: Secondary | ICD-10-CM | POA: Diagnosis not present

## 2023-02-15 DIAGNOSIS — N186 End stage renal disease: Secondary | ICD-10-CM | POA: Diagnosis not present

## 2023-02-16 DIAGNOSIS — N186 End stage renal disease: Secondary | ICD-10-CM | POA: Diagnosis not present

## 2023-02-16 DIAGNOSIS — Z992 Dependence on renal dialysis: Secondary | ICD-10-CM | POA: Diagnosis not present

## 2023-02-17 ENCOUNTER — Telehealth: Payer: Medicare HMO

## 2023-02-17 ENCOUNTER — Ambulatory Visit
Admission: RE | Admit: 2023-02-17 | Discharge: 2023-02-17 | Disposition: A | Discharge status: Home / Self Care | Payer: Medicare HMO | Source: Ambulatory Visit | Attending: Interventional Radiology | Admitting: Interventional Radiology

## 2023-02-17 DIAGNOSIS — N2889 Other specified disorders of kidney and ureter: Secondary | ICD-10-CM

## 2023-02-17 DIAGNOSIS — Z992 Dependence on renal dialysis: Secondary | ICD-10-CM | POA: Diagnosis not present

## 2023-02-17 DIAGNOSIS — N186 End stage renal disease: Secondary | ICD-10-CM | POA: Diagnosis not present

## 2023-02-17 DIAGNOSIS — D3001 Benign neoplasm of right kidney: Secondary | ICD-10-CM | POA: Diagnosis not present

## 2023-02-17 HISTORY — PX: IR RADIOLOGIST EVAL & MGMT: IMG5224

## 2023-02-17 NOTE — Progress Notes (Signed)
Chief Complaint: Patient was consulted remotely today (TeleHealth) for right sided low-grade oncocytic neoplasm  at the request of Seira Cody K.    Referring Physician(s): Dr. Legrand Rams   History of Present Illness: Laura Meyer is a 83 y.o. 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  While undergoing work-up for her underlying renal dysfunction, a 3.5 cm lesion was identified in the lower pole of the right kidney.  Follow-up MRIs were performed without contrast in November 2022 and January 2023 demonstrating slight interval growth of the complex cystic lesion.  Subsequently, she underwent further evaluation with contrast-enhanced CT scan of the abdomen on 09/14/2021 which demonstrates an avidly enhancing an enlarging mass exophytic from the lower pole of the right kidney consistent with an enlarging renal cell carcinoma.  The mass measures 3.8 x 3.8 x 4.2 cm.   Surgical pathology revealed a low-grade oncocytic neoplasm.  This is a relatively new neoplastic classification.  To date, these tumors have not been shown to be aggressive.  This is very good news.  I reviewed these biopsy results with Laura Meyer.  She underwent percutaneous biopsy and concurrent microwave ablation on 11/04/2021.  We spoke over the phone today for her 15 month follow-up evaluation.  she is completely recovered and doing very well.  She has no active complaints.   We reviewed her initial surveillance imaging.  It looks like we have an excellent result of her primary lesion.  No evidence of residual enhancing tissue.  The small possible lesions of the upper pole of the left kidney remained stable and demonstrate no significant interval growth.   CT ABD 07/08/22   1. Status post ablation of an exophytic lower pole right renal mass. Although the mass is larger than the most recent diagnostic CT of 09/14/2021, there is no evidence of residual postcontrast enhancement to  suggest viable tumor. No evidence of abdominal  metastatic disease. 2. The tandem small enhancing upper pole left renal lesions are similar, and remains suspicious for adjacent renal cell carcinomas.  3. A probable complex cyst in the more central lower pole right kidney warrants follow-up attention with pre and post-contrast CT or MRI at 6 months.  CT ABD 02/08/23  1, Minimal decrease in size of lower pole exophytic right renal ablation site, without residual or recurrent disease. 2. Multiple bilateral renal lesions, primarily cysts and complex cysts.  3 small enhancing left renal lesions again identified, suspicious for renal cell carcinomas. An upper pole left renal lesion has minimally enlarged at 13 mm. Other lesions are unchanged.  Past Medical History:  Diagnosis Date   Anemia    Arthritis    hands   CKD (chronic kidney disease), stage IV (HCC)    Hypertension    Pre-diabetes    Renal insufficiency    Vertigo 2019   2 episodes.  none in over 1 yr.    Past Surgical History:  Procedure Laterality Date   CAPD INSERTION N/A 03/12/2021   Procedure: LAPAROSCOPIC INSERTION CONTINUOUS AMBULATORY PERITONEAL DIALYSIS  (CAPD) CATHETER;  Surgeon: Henrene Dodge, MD;  Location: [facility];  Service: General;  Laterality: N/A;  Provider requesting 2 hours / 120 minutes for procedure.   CATARACT EXTRACTION W/PHACO Left 06/28/2018   Procedure: CATARACT EXTRACTION PHACO AND INTRAOCULAR LENS PLACEMENT (IOC)  LEFT;  Surgeon: Lockie Mola, MD;  Location: [facility];  Service: Ophthalmology;  Laterality: Left;   CATARACT EXTRACTION W/PHACO Right 07/19/2018   Procedure: CATARACT EXTRACTION PHACO  AND INTRAOCULAR LENS PLACEMENT (IOC) RIGHT;  Surgeon: Lockie Mola, MD;  Location: [facility];  Service: Ophthalmology;  Laterality: Right;   DILATION AND CURETTAGE OF UTERUS     IR RADIOLOGIST EVAL & MGMT  10/06/2021   IR RADIOLOGIST EVAL & MGMT  11/19/2021   IR RADIOLOGIST EVAL  & MGMT  07/22/2022   IR RADIOLOGIST EVAL & MGMT  02/17/2023   PARATHYROIDECTOMY     RADIOLOGY WITH ANESTHESIA N/A 11/04/2021   Procedure: CT MICROWAVE ABLATION WITH ANESTHESIA;  Surgeon: Sterling Big, MD;  Location: [facility];  Service: Radiology;  Laterality: N/A;   REPLACEMENT TOTAL KNEE Bilateral 07/03/2003   left - 07/03/03, right - 07/26/12    Allergies: Contrast media [iodinated contrast media] and Iodine  Medications: Prior to Admission medications   Medication Sig Start Date End Date Taking? Authorizing Provider  acetaminophen (TYLENOL) 500 MG tablet Take 1,000 mg by mouth every 6 (six) hours as needed for moderate pain or mild pain.    [provider]  allopurinol (ZYLOPRIM) 100 MG tablet Take 100 mg by mouth in the morning.    [provider]  aspirin EC 81 MG tablet Take 81 mg by mouth in the morning.    [provider]  carvedilol (COREG) 12.5 MG tablet Take 12.5 mg by mouth 2 (two) times daily with a meal.    [provider]  Cholecalciferol (VITAMIN D3) 50 MCG (2000 UT) TABS Take 2,000 Units by mouth in the morning.    [provider]  docusate sodium (COLACE) 100 MG capsule Take 200 mg by mouth in the morning.    [provider]  furosemide (LASIX) 80 MG tablet Take 80 mg by mouth daily as needed for fluid.    [provider]  HYDROcodone-acetaminophen (NORCO) 5-325 MG tablet Take 1-2 tablets by mouth every 6 (six) hours as needed for moderate pain. 11/04/21   Allred, Darrell K, PA-C  Polyethyl Glycol-Propyl Glycol (SYSTANE) 0.4-0.3 % SOLN Place 1 drop into both eyes in the morning.    [provider]  simvastatin (ZOCOR) 20 MG tablet Take 20 mg by mouth at bedtime.    [provider]  sodium chloride (MURO 128) 5 % ophthalmic ointment Place 1 application into both eyes at bedtime.    [provider]     No family history on file.  Social History   Socioeconomic History   Marital  status: Widowed    Spouse name: Not on file   Number of children: Not on file   Years of education: Not on file   Highest education level: Not on file  Occupational History   Not on file  Tobacco Use   Smoking status: Never    Passive exposure: Never   Smokeless tobacco: Never  Vaping Use   Vaping status: Never Used  Substance and Sexual Activity   Alcohol use: Not Currently   Drug use: Never   Sexual activity: Not Currently  Other Topics Concern   Not on file  Social History Narrative   Not on file   Social Drivers of Health   Financial Resource Strain: Low Risk  (05/12/2022)   Received from [facility], [facility]   Overall Financial Resource Strain (CARDIA)    Difficulty of Paying Living Expenses: Not hard at all  Food Insecurity: No Food Insecurity (05/12/2022)   [facility] from [facility], [facility]   Hunger Vital Sign  Worried About Programme researcher, broadcasting/film/video in the Last Year: Never true    Ran Out of Food in the Last Year: Never true  Transportation Needs: No Transportation Needs (05/12/2022)   Received from [facility], [facility]   PRAPARE - Transportation    In the past 12 months, has lack of transportation kept you from medical appointments or from getting medications?: No    Lack of Transportation (Non-Medical): No  Physical Activity: Not on file  Stress: Not on file  Social Connections: Not on file    ECOG Status: 0 - Asymptomatic  Review of Systems  Review of Systems: A 12 point ROS discussed and pertinent positives are indicated in the HPI above.  All other systems are negative.  Advance Care Plan: The advanced care plan/surrogate decision maker was discussed at the time of visit and the patient did not wish to discuss or was not able to name a surrogate decision maker or provide an advance care plan.    Physical Exam No direct physical exam was  performed (except for noted visual exam findings with Video Visits).    Vital Signs: There were no vitals taken for this visit.  Imaging: IR Radiologist Eval & Mgmt Result Date: 02/17/2023 EXAM: ESTABLISHED PATIENT OFFICE VISIT CHIEF COMPLAINT: SEE NOTE IN EPIC HISTORY OF PRESENT ILLNESS: SEE NOTE IN EPIC REVIEW OF SYSTEMS: SEE NOTE IN EPIC PHYSICAL EXAMINATION: SEE NOTE IN EPIC ASSESSMENT AND PLAN: SEE NOTE IN EPIC Electronically Signed   By: Malachy Moan M.D.   On: 02/17/2023 14:18   CT ABDOMEN W WO CONTRAST Result Date: 02/16/2023 CLINICAL DATA:  Right-sided renal ablation. Hypertension. Peritoneal dialysis. * Tracking Code: BO * EXAM: CT ABDOMEN WITHOUT AND WITH CONTRAST TECHNIQUE: Multidetector CT imaging of the abdomen was performed following the standard protocol before and following the bolus administration of intravenous contrast. RADIATION DOSE REDUCTION: This exam was performed according to the departmental dose-optimization program which includes automated exposure control, adjustment of the mA and/or kV according to patient size and/or use of iterative reconstruction technique. CONTRAST:  ISOVUE-370 IOPAMIDOL (ISOVUE-370) INJECTION 76% COMPARISON:  07/08/2022 FINDINGS: Minimal motion degradation throughout. Lower chest: Clear lung bases. New trace right pleural fluid. Mild cardiomegaly with small hiatal hernia. Hepatobiliary: Normal liver. Noncalcified gallstones without acute cholecystitis or biliary duct dilatation. Pancreas: Pancreatic cystic lesions including within the head of up to 2.1 cm on 44/6, grossly similar to on the prior. No peripancreatic edema. Spleen: Normal in size, without focal abnormality. Adrenals/Urinary Tract: Normal adrenal glands. No renal calculi or hydronephrosis. The lower pole right renal exophytic ablation site is again nonenhancing. Measures 4.0 x 3.6 cm on 56/6 versus 4.2 x 3.8 cm on the prior exam. Primarily similar bilateral renal cysts and complex  cysts. Lower pole left renal complex cyst, including at 2.4 cm on 69/10. The lower pole right renal probable complex cyst measures 1.7 cm on 40/4 and is similar in size to 1.6 cm on the prior. Adjacent upper pole left renal enhancing lesions measure 13 mm each on 42/6. Versus 13 and 10 mm when remeasured in a similar fashion on the prior. A subtle lower pole left renal 8 mm enhancing lesion anteriorly on 66/6 and 71/8 is similar to on the prior (when remeasured). Stomach/Bowel: Normal remainder of the stomach. Normal colon and terminal ileum. Normal small bowel. Vascular/Lymphatic: Aortic atherosclerosis. Patent renal veins. No retroperitoneal or retrocrural adenopathy. Other: Increase in small volume abdominal ascites. No free intraperitoneal air.  Incompletely imaged peritoneal dialysis catheter. Musculoskeletal: Lumbar spondylosis. IMPRESSION: 1. Minimal decrease in size of lower pole exophytic right renal ablation site, without residual or recurrent disease. 2. Multiple bilateral renal lesions, primarily cysts and complex cysts. 3 small enhancing left renal lesions again identified, suspicious for renal cell carcinomas. An upper pole left renal lesion has minimally enlarged at 13 mm. Other lesions are unchanged. 3. No evidence of abdominal metastatic disease. 4. Increase in small volume abdominal ascites with new trace right pleural fluid. 5. Minimal motion degradation throughout 6. Cholelithiasis 7.  Aortic Atherosclerosis (ICD10-I70.0). 8. Small hiatal hernia. Electronically Signed   By: Jeronimo Greaves M.D.   On: 02/16/2023 11:28    Labs:  CBC: No results for input(s): "WBC", "HGB", "HCT", "PLT" in the last 8760 hours.  COAGS: No results for input(s): "INR", "APTT" in the last 8760 hours.  BMP: No results for input(s): "NA", "K", "CL", "CO2", "GLUCOSE", "BUN", "CALCIUM", "CREATININE", "GFRNONAA", "GFRAA" in the last 8760 hours.  Invalid input(s): "CMP"  LIVER FUNCTION TESTS: No results for  input(s): "BILITOT", "AST", "ALT", "ALKPHOS", "PROT", "ALBUMIN" in the last 8760 hours.  TUMOR MARKERS: No results for input(s): "AFPTM", "CEA", "CA199", "CHROMGRNA" in the last 8760 hours.  Assessment and Plan:  Extremely pleasant 83 year old female with a history of right-sided low-grade oncocytic neoplasm.  She underwent percutaneous microwave ablation on 11/04/2021.  Surveillance imaging demonstrates an excellent treatment effect.  No evidence of residual enhancing tissue at the previously treated lesion.  Further, the  small enhancing lesions at the upper pole of the left kidney remain grossly stable.   We will continue routine imaging surveillance.   1.)  Repeat renal protocol CT scan of the abdomen and accompanying clinic visit in 1 year.   Thank you for this interesting consult.  I greatly enjoyed meeting Laura Meyer and look forward to participating in their care.  A copy of this report was sent to the requesting provider on this date.  Electronically Signed: Sterling Big 02/17/2023, 3:06 PM   I spent a total of  15 Minutes in remote  clinical consultation, greater than 50% of which was counseling/coordinating care for oncocytic neoplasm (right).    Visit type: Audio only (telephone). Audio (no video) only due to patient preference. Alternative for in-person consultation at [facility], [address] [place], [place], [place]. This visit type was conducted due to national recommendations for restrictions regarding the COVID-19 Pandemic (e.g. social distancing).  This format is felt to be most appropriate for this patient at this time.  All issues noted in this document were discussed and addressed.

## 2023-02-18 DIAGNOSIS — Z833 Family history of diabetes mellitus: Secondary | ICD-10-CM | POA: Diagnosis not present

## 2023-02-18 DIAGNOSIS — Z992 Dependence on renal dialysis: Secondary | ICD-10-CM | POA: Diagnosis not present

## 2023-02-18 DIAGNOSIS — Z8249 Family history of ischemic heart disease and other diseases of the circulatory system: Secondary | ICD-10-CM | POA: Diagnosis not present

## 2023-02-18 DIAGNOSIS — Z9841 Cataract extraction status, right eye: Secondary | ICD-10-CM | POA: Diagnosis not present

## 2023-02-18 DIAGNOSIS — Z85528 Personal history of other malignant neoplasm of kidney: Secondary | ICD-10-CM | POA: Diagnosis not present

## 2023-02-18 DIAGNOSIS — M1990 Unspecified osteoarthritis, unspecified site: Secondary | ICD-10-CM | POA: Diagnosis not present

## 2023-02-18 DIAGNOSIS — E785 Hyperlipidemia, unspecified: Secondary | ICD-10-CM | POA: Diagnosis not present

## 2023-02-18 DIAGNOSIS — N186 End stage renal disease: Secondary | ICD-10-CM | POA: Diagnosis not present

## 2023-02-18 DIAGNOSIS — Z9842 Cataract extraction status, left eye: Secondary | ICD-10-CM | POA: Diagnosis not present

## 2023-02-18 DIAGNOSIS — Z7982 Long term (current) use of aspirin: Secondary | ICD-10-CM | POA: Diagnosis not present

## 2023-02-18 DIAGNOSIS — I120 Hypertensive chronic kidney disease with stage 5 chronic kidney disease or end stage renal disease: Secondary | ICD-10-CM | POA: Diagnosis not present

## 2023-02-18 DIAGNOSIS — K5900 Constipation, unspecified: Secondary | ICD-10-CM | POA: Diagnosis not present

## 2023-02-18 DIAGNOSIS — N250 Renal osteodystrophy: Secondary | ICD-10-CM | POA: Diagnosis not present

## 2023-02-18 DIAGNOSIS — M109 Gout, unspecified: Secondary | ICD-10-CM | POA: Diagnosis not present

## 2023-02-19 DIAGNOSIS — Z992 Dependence on renal dialysis: Secondary | ICD-10-CM | POA: Diagnosis not present

## 2023-02-19 DIAGNOSIS — N186 End stage renal disease: Secondary | ICD-10-CM | POA: Diagnosis not present

## 2023-02-20 DIAGNOSIS — N186 End stage renal disease: Secondary | ICD-10-CM | POA: Diagnosis not present

## 2023-02-20 DIAGNOSIS — Z992 Dependence on renal dialysis: Secondary | ICD-10-CM | POA: Diagnosis not present

## 2023-02-21 DIAGNOSIS — N186 End stage renal disease: Secondary | ICD-10-CM | POA: Diagnosis not present

## 2023-02-21 DIAGNOSIS — Z992 Dependence on renal dialysis: Secondary | ICD-10-CM | POA: Diagnosis not present

## 2023-02-22 DIAGNOSIS — N186 End stage renal disease: Secondary | ICD-10-CM | POA: Diagnosis not present

## 2023-02-22 DIAGNOSIS — Z992 Dependence on renal dialysis: Secondary | ICD-10-CM | POA: Diagnosis not present

## 2023-02-23 DIAGNOSIS — Z992 Dependence on renal dialysis: Secondary | ICD-10-CM | POA: Diagnosis not present

## 2023-02-23 DIAGNOSIS — N186 End stage renal disease: Secondary | ICD-10-CM | POA: Diagnosis not present

## 2023-02-24 DIAGNOSIS — N186 End stage renal disease: Secondary | ICD-10-CM | POA: Diagnosis not present

## 2023-02-24 DIAGNOSIS — Z992 Dependence on renal dialysis: Secondary | ICD-10-CM | POA: Diagnosis not present

## 2023-02-25 DIAGNOSIS — Z992 Dependence on renal dialysis: Secondary | ICD-10-CM | POA: Diagnosis not present

## 2023-02-25 DIAGNOSIS — N186 End stage renal disease: Secondary | ICD-10-CM | POA: Diagnosis not present

## 2023-02-26 DIAGNOSIS — N186 End stage renal disease: Secondary | ICD-10-CM | POA: Diagnosis not present

## 2023-02-26 DIAGNOSIS — Z992 Dependence on renal dialysis: Secondary | ICD-10-CM | POA: Diagnosis not present

## 2023-02-27 DIAGNOSIS — Z992 Dependence on renal dialysis: Secondary | ICD-10-CM | POA: Diagnosis not present

## 2023-02-27 DIAGNOSIS — N186 End stage renal disease: Secondary | ICD-10-CM | POA: Diagnosis not present

## 2023-02-28 DIAGNOSIS — Z0100 Encounter for examination of eyes and vision without abnormal findings: Secondary | ICD-10-CM | POA: Diagnosis not present

## 2023-02-28 DIAGNOSIS — N186 End stage renal disease: Secondary | ICD-10-CM | POA: Diagnosis not present

## 2023-02-28 DIAGNOSIS — Z992 Dependence on renal dialysis: Secondary | ICD-10-CM | POA: Diagnosis not present

## 2023-02-28 DIAGNOSIS — H53002 Unspecified amblyopia, left eye: Secondary | ICD-10-CM | POA: Diagnosis not present

## 2023-02-28 DIAGNOSIS — H353131 Nonexudative age-related macular degeneration, bilateral, early dry stage: Secondary | ICD-10-CM | POA: Diagnosis not present

## 2023-02-28 DIAGNOSIS — Z961 Presence of intraocular lens: Secondary | ICD-10-CM | POA: Diagnosis not present

## 2023-03-01 DIAGNOSIS — N186 End stage renal disease: Secondary | ICD-10-CM | POA: Diagnosis not present

## 2023-03-01 DIAGNOSIS — Z992 Dependence on renal dialysis: Secondary | ICD-10-CM | POA: Diagnosis not present

## 2023-03-02 DIAGNOSIS — N186 End stage renal disease: Secondary | ICD-10-CM | POA: Diagnosis not present

## 2023-03-02 DIAGNOSIS — Z992 Dependence on renal dialysis: Secondary | ICD-10-CM | POA: Diagnosis not present

## 2023-03-03 DIAGNOSIS — Z992 Dependence on renal dialysis: Secondary | ICD-10-CM | POA: Diagnosis not present

## 2023-03-03 DIAGNOSIS — N186 End stage renal disease: Secondary | ICD-10-CM | POA: Diagnosis not present

## 2023-03-04 DIAGNOSIS — N186 End stage renal disease: Secondary | ICD-10-CM | POA: Diagnosis not present

## 2023-03-04 DIAGNOSIS — Z992 Dependence on renal dialysis: Secondary | ICD-10-CM | POA: Diagnosis not present

## 2023-03-05 DIAGNOSIS — Z23 Encounter for immunization: Secondary | ICD-10-CM | POA: Diagnosis not present

## 2023-03-05 DIAGNOSIS — Z992 Dependence on renal dialysis: Secondary | ICD-10-CM | POA: Diagnosis not present

## 2023-03-05 DIAGNOSIS — N186 End stage renal disease: Secondary | ICD-10-CM | POA: Diagnosis not present

## 2023-03-06 DIAGNOSIS — Z992 Dependence on renal dialysis: Secondary | ICD-10-CM | POA: Diagnosis not present

## 2023-03-06 DIAGNOSIS — N186 End stage renal disease: Secondary | ICD-10-CM | POA: Diagnosis not present

## 2023-03-06 DIAGNOSIS — Z23 Encounter for immunization: Secondary | ICD-10-CM | POA: Diagnosis not present

## 2023-03-07 DIAGNOSIS — Z23 Encounter for immunization: Secondary | ICD-10-CM | POA: Diagnosis not present

## 2023-03-07 DIAGNOSIS — Z992 Dependence on renal dialysis: Secondary | ICD-10-CM | POA: Diagnosis not present

## 2023-03-07 DIAGNOSIS — N186 End stage renal disease: Secondary | ICD-10-CM | POA: Diagnosis not present

## 2023-03-08 DIAGNOSIS — Z992 Dependence on renal dialysis: Secondary | ICD-10-CM | POA: Diagnosis not present

## 2023-03-08 DIAGNOSIS — N186 End stage renal disease: Secondary | ICD-10-CM | POA: Diagnosis not present

## 2023-03-08 DIAGNOSIS — Z23 Encounter for immunization: Secondary | ICD-10-CM | POA: Diagnosis not present

## 2023-03-09 DIAGNOSIS — Z992 Dependence on renal dialysis: Secondary | ICD-10-CM | POA: Diagnosis not present

## 2023-03-09 DIAGNOSIS — N186 End stage renal disease: Secondary | ICD-10-CM | POA: Diagnosis not present

## 2023-03-09 DIAGNOSIS — Z23 Encounter for immunization: Secondary | ICD-10-CM | POA: Diagnosis not present

## 2023-03-10 DIAGNOSIS — Z992 Dependence on renal dialysis: Secondary | ICD-10-CM | POA: Diagnosis not present

## 2023-03-10 DIAGNOSIS — N186 End stage renal disease: Secondary | ICD-10-CM | POA: Diagnosis not present

## 2023-03-10 DIAGNOSIS — Z23 Encounter for immunization: Secondary | ICD-10-CM | POA: Diagnosis not present

## 2023-03-11 DIAGNOSIS — N186 End stage renal disease: Secondary | ICD-10-CM | POA: Diagnosis not present

## 2023-03-11 DIAGNOSIS — Z23 Encounter for immunization: Secondary | ICD-10-CM | POA: Diagnosis not present

## 2023-03-11 DIAGNOSIS — Z992 Dependence on renal dialysis: Secondary | ICD-10-CM | POA: Diagnosis not present

## 2023-03-12 DIAGNOSIS — N186 End stage renal disease: Secondary | ICD-10-CM | POA: Diagnosis not present

## 2023-03-12 DIAGNOSIS — Z992 Dependence on renal dialysis: Secondary | ICD-10-CM | POA: Diagnosis not present

## 2023-03-12 DIAGNOSIS — Z23 Encounter for immunization: Secondary | ICD-10-CM | POA: Diagnosis not present

## 2023-03-13 DIAGNOSIS — Z992 Dependence on renal dialysis: Secondary | ICD-10-CM | POA: Diagnosis not present

## 2023-03-13 DIAGNOSIS — Z23 Encounter for immunization: Secondary | ICD-10-CM | POA: Diagnosis not present

## 2023-03-13 DIAGNOSIS — N186 End stage renal disease: Secondary | ICD-10-CM | POA: Diagnosis not present

## 2023-03-14 DIAGNOSIS — Z23 Encounter for immunization: Secondary | ICD-10-CM | POA: Diagnosis not present

## 2023-03-14 DIAGNOSIS — Z992 Dependence on renal dialysis: Secondary | ICD-10-CM | POA: Diagnosis not present

## 2023-03-14 DIAGNOSIS — N186 End stage renal disease: Secondary | ICD-10-CM | POA: Diagnosis not present

## 2023-03-15 DIAGNOSIS — Z23 Encounter for immunization: Secondary | ICD-10-CM | POA: Diagnosis not present

## 2023-03-15 DIAGNOSIS — Z992 Dependence on renal dialysis: Secondary | ICD-10-CM | POA: Diagnosis not present

## 2023-03-15 DIAGNOSIS — N186 End stage renal disease: Secondary | ICD-10-CM | POA: Diagnosis not present

## 2023-03-16 DIAGNOSIS — Z992 Dependence on renal dialysis: Secondary | ICD-10-CM | POA: Diagnosis not present

## 2023-03-16 DIAGNOSIS — N186 End stage renal disease: Secondary | ICD-10-CM | POA: Diagnosis not present

## 2023-03-16 DIAGNOSIS — Z23 Encounter for immunization: Secondary | ICD-10-CM | POA: Diagnosis not present

## 2023-03-17 DIAGNOSIS — Z23 Encounter for immunization: Secondary | ICD-10-CM | POA: Diagnosis not present

## 2023-03-17 DIAGNOSIS — Z992 Dependence on renal dialysis: Secondary | ICD-10-CM | POA: Diagnosis not present

## 2023-03-17 DIAGNOSIS — N186 End stage renal disease: Secondary | ICD-10-CM | POA: Diagnosis not present

## 2023-03-18 DIAGNOSIS — Z23 Encounter for immunization: Secondary | ICD-10-CM | POA: Diagnosis not present

## 2023-03-18 DIAGNOSIS — Z992 Dependence on renal dialysis: Secondary | ICD-10-CM | POA: Diagnosis not present

## 2023-03-18 DIAGNOSIS — N186 End stage renal disease: Secondary | ICD-10-CM | POA: Diagnosis not present

## 2023-03-19 DIAGNOSIS — Z992 Dependence on renal dialysis: Secondary | ICD-10-CM | POA: Diagnosis not present

## 2023-03-19 DIAGNOSIS — N186 End stage renal disease: Secondary | ICD-10-CM | POA: Diagnosis not present

## 2023-03-19 DIAGNOSIS — Z23 Encounter for immunization: Secondary | ICD-10-CM | POA: Diagnosis not present

## 2023-03-20 DIAGNOSIS — Z992 Dependence on renal dialysis: Secondary | ICD-10-CM | POA: Diagnosis not present

## 2023-03-20 DIAGNOSIS — Z23 Encounter for immunization: Secondary | ICD-10-CM | POA: Diagnosis not present

## 2023-03-20 DIAGNOSIS — N186 End stage renal disease: Secondary | ICD-10-CM | POA: Diagnosis not present

## 2023-03-21 DIAGNOSIS — N186 End stage renal disease: Secondary | ICD-10-CM | POA: Diagnosis not present

## 2023-03-21 DIAGNOSIS — Z992 Dependence on renal dialysis: Secondary | ICD-10-CM | POA: Diagnosis not present

## 2023-03-21 DIAGNOSIS — Z23 Encounter for immunization: Secondary | ICD-10-CM | POA: Diagnosis not present

## 2023-03-22 DIAGNOSIS — N186 End stage renal disease: Secondary | ICD-10-CM | POA: Diagnosis not present

## 2023-03-22 DIAGNOSIS — Z992 Dependence on renal dialysis: Secondary | ICD-10-CM | POA: Diagnosis not present

## 2023-03-22 DIAGNOSIS — Z23 Encounter for immunization: Secondary | ICD-10-CM | POA: Diagnosis not present

## 2023-03-23 DIAGNOSIS — Z23 Encounter for immunization: Secondary | ICD-10-CM | POA: Diagnosis not present

## 2023-03-23 DIAGNOSIS — N186 End stage renal disease: Secondary | ICD-10-CM | POA: Diagnosis not present

## 2023-03-23 DIAGNOSIS — Z992 Dependence on renal dialysis: Secondary | ICD-10-CM | POA: Diagnosis not present

## 2023-03-24 DIAGNOSIS — Z992 Dependence on renal dialysis: Secondary | ICD-10-CM | POA: Diagnosis not present

## 2023-03-24 DIAGNOSIS — N186 End stage renal disease: Secondary | ICD-10-CM | POA: Diagnosis not present

## 2023-03-24 DIAGNOSIS — Z23 Encounter for immunization: Secondary | ICD-10-CM | POA: Diagnosis not present

## 2023-03-25 DIAGNOSIS — Z992 Dependence on renal dialysis: Secondary | ICD-10-CM | POA: Diagnosis not present

## 2023-03-25 DIAGNOSIS — Z23 Encounter for immunization: Secondary | ICD-10-CM | POA: Diagnosis not present

## 2023-03-25 DIAGNOSIS — N186 End stage renal disease: Secondary | ICD-10-CM | POA: Diagnosis not present

## 2023-03-26 DIAGNOSIS — N186 End stage renal disease: Secondary | ICD-10-CM | POA: Diagnosis not present

## 2023-03-26 DIAGNOSIS — Z23 Encounter for immunization: Secondary | ICD-10-CM | POA: Diagnosis not present

## 2023-03-26 DIAGNOSIS — Z992 Dependence on renal dialysis: Secondary | ICD-10-CM | POA: Diagnosis not present

## 2023-03-27 DIAGNOSIS — N186 End stage renal disease: Secondary | ICD-10-CM | POA: Diagnosis not present

## 2023-03-27 DIAGNOSIS — Z992 Dependence on renal dialysis: Secondary | ICD-10-CM | POA: Diagnosis not present

## 2023-03-27 DIAGNOSIS — Z23 Encounter for immunization: Secondary | ICD-10-CM | POA: Diagnosis not present

## 2023-03-28 DIAGNOSIS — N186 End stage renal disease: Secondary | ICD-10-CM | POA: Diagnosis not present

## 2023-03-28 DIAGNOSIS — Z23 Encounter for immunization: Secondary | ICD-10-CM | POA: Diagnosis not present

## 2023-03-28 DIAGNOSIS — Z992 Dependence on renal dialysis: Secondary | ICD-10-CM | POA: Diagnosis not present

## 2023-03-29 DIAGNOSIS — Z992 Dependence on renal dialysis: Secondary | ICD-10-CM | POA: Diagnosis not present

## 2023-03-29 DIAGNOSIS — N186 End stage renal disease: Secondary | ICD-10-CM | POA: Diagnosis not present

## 2023-03-29 DIAGNOSIS — Z23 Encounter for immunization: Secondary | ICD-10-CM | POA: Diagnosis not present

## 2023-03-30 DIAGNOSIS — Z992 Dependence on renal dialysis: Secondary | ICD-10-CM | POA: Diagnosis not present

## 2023-03-30 DIAGNOSIS — Z23 Encounter for immunization: Secondary | ICD-10-CM | POA: Diagnosis not present

## 2023-03-30 DIAGNOSIS — N186 End stage renal disease: Secondary | ICD-10-CM | POA: Diagnosis not present

## 2023-03-31 DIAGNOSIS — Z23 Encounter for immunization: Secondary | ICD-10-CM | POA: Diagnosis not present

## 2023-03-31 DIAGNOSIS — Z992 Dependence on renal dialysis: Secondary | ICD-10-CM | POA: Diagnosis not present

## 2023-03-31 DIAGNOSIS — N186 End stage renal disease: Secondary | ICD-10-CM | POA: Diagnosis not present

## 2023-04-01 DIAGNOSIS — N186 End stage renal disease: Secondary | ICD-10-CM | POA: Diagnosis not present

## 2023-04-01 DIAGNOSIS — Z23 Encounter for immunization: Secondary | ICD-10-CM | POA: Diagnosis not present

## 2023-04-01 DIAGNOSIS — Z992 Dependence on renal dialysis: Secondary | ICD-10-CM | POA: Diagnosis not present

## 2023-04-02 DIAGNOSIS — Z992 Dependence on renal dialysis: Secondary | ICD-10-CM | POA: Diagnosis not present

## 2023-04-02 DIAGNOSIS — N186 End stage renal disease: Secondary | ICD-10-CM | POA: Diagnosis not present

## 2023-04-03 DIAGNOSIS — N186 End stage renal disease: Secondary | ICD-10-CM | POA: Diagnosis not present

## 2023-04-03 DIAGNOSIS — Z992 Dependence on renal dialysis: Secondary | ICD-10-CM | POA: Diagnosis not present

## 2023-04-04 DIAGNOSIS — N186 End stage renal disease: Secondary | ICD-10-CM | POA: Diagnosis not present

## 2023-04-04 DIAGNOSIS — Z992 Dependence on renal dialysis: Secondary | ICD-10-CM | POA: Diagnosis not present

## 2023-04-05 DIAGNOSIS — Z992 Dependence on renal dialysis: Secondary | ICD-10-CM | POA: Diagnosis not present

## 2023-04-05 DIAGNOSIS — N186 End stage renal disease: Secondary | ICD-10-CM | POA: Diagnosis not present

## 2023-04-06 DIAGNOSIS — N186 End stage renal disease: Secondary | ICD-10-CM | POA: Diagnosis not present

## 2023-04-06 DIAGNOSIS — Z992 Dependence on renal dialysis: Secondary | ICD-10-CM | POA: Diagnosis not present

## 2023-04-07 DIAGNOSIS — Z992 Dependence on renal dialysis: Secondary | ICD-10-CM | POA: Diagnosis not present

## 2023-04-07 DIAGNOSIS — N186 End stage renal disease: Secondary | ICD-10-CM | POA: Diagnosis not present

## 2023-04-08 DIAGNOSIS — Z992 Dependence on renal dialysis: Secondary | ICD-10-CM | POA: Diagnosis not present

## 2023-04-08 DIAGNOSIS — N186 End stage renal disease: Secondary | ICD-10-CM | POA: Diagnosis not present

## 2023-04-09 DIAGNOSIS — N186 End stage renal disease: Secondary | ICD-10-CM | POA: Diagnosis not present

## 2023-04-09 DIAGNOSIS — Z992 Dependence on renal dialysis: Secondary | ICD-10-CM | POA: Diagnosis not present

## 2023-04-10 DIAGNOSIS — Z992 Dependence on renal dialysis: Secondary | ICD-10-CM | POA: Diagnosis not present

## 2023-04-10 DIAGNOSIS — N186 End stage renal disease: Secondary | ICD-10-CM | POA: Diagnosis not present

## 2023-04-11 DIAGNOSIS — Z992 Dependence on renal dialysis: Secondary | ICD-10-CM | POA: Diagnosis not present

## 2023-04-11 DIAGNOSIS — N186 End stage renal disease: Secondary | ICD-10-CM | POA: Diagnosis not present

## 2023-04-12 DIAGNOSIS — N186 End stage renal disease: Secondary | ICD-10-CM | POA: Diagnosis not present

## 2023-04-12 DIAGNOSIS — Z992 Dependence on renal dialysis: Secondary | ICD-10-CM | POA: Diagnosis not present

## 2023-04-13 DIAGNOSIS — Z992 Dependence on renal dialysis: Secondary | ICD-10-CM | POA: Diagnosis not present

## 2023-04-13 DIAGNOSIS — N186 End stage renal disease: Secondary | ICD-10-CM | POA: Diagnosis not present

## 2023-04-14 DIAGNOSIS — N186 End stage renal disease: Secondary | ICD-10-CM | POA: Diagnosis not present

## 2023-04-14 DIAGNOSIS — Z992 Dependence on renal dialysis: Secondary | ICD-10-CM | POA: Diagnosis not present

## 2023-04-15 DIAGNOSIS — N186 End stage renal disease: Secondary | ICD-10-CM | POA: Diagnosis not present

## 2023-04-15 DIAGNOSIS — Z992 Dependence on renal dialysis: Secondary | ICD-10-CM | POA: Diagnosis not present

## 2023-04-16 DIAGNOSIS — Z992 Dependence on renal dialysis: Secondary | ICD-10-CM | POA: Diagnosis not present

## 2023-04-16 DIAGNOSIS — N186 End stage renal disease: Secondary | ICD-10-CM | POA: Diagnosis not present

## 2023-04-17 DIAGNOSIS — N186 End stage renal disease: Secondary | ICD-10-CM | POA: Diagnosis not present

## 2023-04-17 DIAGNOSIS — Z992 Dependence on renal dialysis: Secondary | ICD-10-CM | POA: Diagnosis not present

## 2023-04-18 DIAGNOSIS — Z992 Dependence on renal dialysis: Secondary | ICD-10-CM | POA: Diagnosis not present

## 2023-04-18 DIAGNOSIS — N186 End stage renal disease: Secondary | ICD-10-CM | POA: Diagnosis not present

## 2023-04-19 DIAGNOSIS — N186 End stage renal disease: Secondary | ICD-10-CM | POA: Diagnosis not present

## 2023-04-19 DIAGNOSIS — Z992 Dependence on renal dialysis: Secondary | ICD-10-CM | POA: Diagnosis not present

## 2023-04-20 DIAGNOSIS — N186 End stage renal disease: Secondary | ICD-10-CM | POA: Diagnosis not present

## 2023-04-20 DIAGNOSIS — Z992 Dependence on renal dialysis: Secondary | ICD-10-CM | POA: Diagnosis not present

## 2023-04-21 DIAGNOSIS — Z992 Dependence on renal dialysis: Secondary | ICD-10-CM | POA: Diagnosis not present

## 2023-04-21 DIAGNOSIS — N186 End stage renal disease: Secondary | ICD-10-CM | POA: Diagnosis not present

## 2023-04-22 DIAGNOSIS — Z992 Dependence on renal dialysis: Secondary | ICD-10-CM | POA: Diagnosis not present

## 2023-04-22 DIAGNOSIS — N186 End stage renal disease: Secondary | ICD-10-CM | POA: Diagnosis not present

## 2023-04-25 DIAGNOSIS — Z992 Dependence on renal dialysis: Secondary | ICD-10-CM | POA: Diagnosis not present

## 2023-04-25 DIAGNOSIS — N186 End stage renal disease: Secondary | ICD-10-CM | POA: Diagnosis not present

## 2023-04-26 DIAGNOSIS — Z992 Dependence on renal dialysis: Secondary | ICD-10-CM | POA: Diagnosis not present

## 2023-04-26 DIAGNOSIS — N186 End stage renal disease: Secondary | ICD-10-CM | POA: Diagnosis not present

## 2023-04-27 DIAGNOSIS — Z992 Dependence on renal dialysis: Secondary | ICD-10-CM | POA: Diagnosis not present

## 2023-04-27 DIAGNOSIS — N186 End stage renal disease: Secondary | ICD-10-CM | POA: Diagnosis not present

## 2023-04-28 DIAGNOSIS — Z992 Dependence on renal dialysis: Secondary | ICD-10-CM | POA: Diagnosis not present

## 2023-04-28 DIAGNOSIS — N186 End stage renal disease: Secondary | ICD-10-CM | POA: Diagnosis not present

## 2023-04-29 DIAGNOSIS — Z992 Dependence on renal dialysis: Secondary | ICD-10-CM | POA: Diagnosis not present

## 2023-04-29 DIAGNOSIS — N186 End stage renal disease: Secondary | ICD-10-CM | POA: Diagnosis not present

## 2023-04-30 DIAGNOSIS — Z992 Dependence on renal dialysis: Secondary | ICD-10-CM | POA: Diagnosis not present

## 2023-04-30 DIAGNOSIS — N186 End stage renal disease: Secondary | ICD-10-CM | POA: Diagnosis not present

## 2023-05-01 DIAGNOSIS — N186 End stage renal disease: Secondary | ICD-10-CM | POA: Diagnosis not present

## 2023-05-01 DIAGNOSIS — Z992 Dependence on renal dialysis: Secondary | ICD-10-CM | POA: Diagnosis not present

## 2023-05-02 DIAGNOSIS — N186 End stage renal disease: Secondary | ICD-10-CM | POA: Diagnosis not present

## 2023-05-02 DIAGNOSIS — Z992 Dependence on renal dialysis: Secondary | ICD-10-CM | POA: Diagnosis not present

## 2023-05-03 DIAGNOSIS — N186 End stage renal disease: Secondary | ICD-10-CM | POA: Diagnosis not present

## 2023-05-03 DIAGNOSIS — Z23 Encounter for immunization: Secondary | ICD-10-CM | POA: Diagnosis not present

## 2023-05-03 DIAGNOSIS — Z992 Dependence on renal dialysis: Secondary | ICD-10-CM | POA: Diagnosis not present

## 2023-05-04 DIAGNOSIS — Z992 Dependence on renal dialysis: Secondary | ICD-10-CM | POA: Diagnosis not present

## 2023-05-04 DIAGNOSIS — N186 End stage renal disease: Secondary | ICD-10-CM | POA: Diagnosis not present

## 2023-05-04 DIAGNOSIS — Z23 Encounter for immunization: Secondary | ICD-10-CM | POA: Diagnosis not present

## 2023-05-05 DIAGNOSIS — Z23 Encounter for immunization: Secondary | ICD-10-CM | POA: Diagnosis not present

## 2023-05-05 DIAGNOSIS — Z992 Dependence on renal dialysis: Secondary | ICD-10-CM | POA: Diagnosis not present

## 2023-05-05 DIAGNOSIS — N186 End stage renal disease: Secondary | ICD-10-CM | POA: Diagnosis not present

## 2023-05-06 DIAGNOSIS — Z992 Dependence on renal dialysis: Secondary | ICD-10-CM | POA: Diagnosis not present

## 2023-05-06 DIAGNOSIS — N186 End stage renal disease: Secondary | ICD-10-CM | POA: Diagnosis not present

## 2023-05-06 DIAGNOSIS — Z23 Encounter for immunization: Secondary | ICD-10-CM | POA: Diagnosis not present

## 2023-05-07 DIAGNOSIS — N186 End stage renal disease: Secondary | ICD-10-CM | POA: Diagnosis not present

## 2023-05-07 DIAGNOSIS — Z23 Encounter for immunization: Secondary | ICD-10-CM | POA: Diagnosis not present

## 2023-05-07 DIAGNOSIS — Z992 Dependence on renal dialysis: Secondary | ICD-10-CM | POA: Diagnosis not present

## 2023-05-08 DIAGNOSIS — Z23 Encounter for immunization: Secondary | ICD-10-CM | POA: Diagnosis not present

## 2023-05-08 DIAGNOSIS — Z992 Dependence on renal dialysis: Secondary | ICD-10-CM | POA: Diagnosis not present

## 2023-05-08 DIAGNOSIS — N186 End stage renal disease: Secondary | ICD-10-CM | POA: Diagnosis not present

## 2023-05-09 DIAGNOSIS — N186 End stage renal disease: Secondary | ICD-10-CM | POA: Diagnosis not present

## 2023-05-09 DIAGNOSIS — Z23 Encounter for immunization: Secondary | ICD-10-CM | POA: Diagnosis not present

## 2023-05-09 DIAGNOSIS — Z992 Dependence on renal dialysis: Secondary | ICD-10-CM | POA: Diagnosis not present

## 2023-05-10 DIAGNOSIS — Z992 Dependence on renal dialysis: Secondary | ICD-10-CM | POA: Diagnosis not present

## 2023-05-10 DIAGNOSIS — Z23 Encounter for immunization: Secondary | ICD-10-CM | POA: Diagnosis not present

## 2023-05-10 DIAGNOSIS — N186 End stage renal disease: Secondary | ICD-10-CM | POA: Diagnosis not present

## 2023-05-11 DIAGNOSIS — E119 Type 2 diabetes mellitus without complications: Secondary | ICD-10-CM | POA: Diagnosis not present

## 2023-05-11 DIAGNOSIS — Z23 Encounter for immunization: Secondary | ICD-10-CM | POA: Diagnosis not present

## 2023-05-11 DIAGNOSIS — N186 End stage renal disease: Secondary | ICD-10-CM | POA: Diagnosis not present

## 2023-05-11 DIAGNOSIS — Z992 Dependence on renal dialysis: Secondary | ICD-10-CM | POA: Diagnosis not present

## 2023-05-11 DIAGNOSIS — Z794 Long term (current) use of insulin: Secondary | ICD-10-CM | POA: Diagnosis not present

## 2023-05-12 DIAGNOSIS — N186 End stage renal disease: Secondary | ICD-10-CM | POA: Diagnosis not present

## 2023-05-12 DIAGNOSIS — Z992 Dependence on renal dialysis: Secondary | ICD-10-CM | POA: Diagnosis not present

## 2023-05-12 DIAGNOSIS — Z23 Encounter for immunization: Secondary | ICD-10-CM | POA: Diagnosis not present

## 2023-05-13 DIAGNOSIS — Z23 Encounter for immunization: Secondary | ICD-10-CM | POA: Diagnosis not present

## 2023-05-13 DIAGNOSIS — N186 End stage renal disease: Secondary | ICD-10-CM | POA: Diagnosis not present

## 2023-05-13 DIAGNOSIS — Z992 Dependence on renal dialysis: Secondary | ICD-10-CM | POA: Diagnosis not present

## 2023-05-14 DIAGNOSIS — Z23 Encounter for immunization: Secondary | ICD-10-CM | POA: Diagnosis not present

## 2023-05-14 DIAGNOSIS — N186 End stage renal disease: Secondary | ICD-10-CM | POA: Diagnosis not present

## 2023-05-14 DIAGNOSIS — Z992 Dependence on renal dialysis: Secondary | ICD-10-CM | POA: Diagnosis not present

## 2023-05-15 DIAGNOSIS — N186 End stage renal disease: Secondary | ICD-10-CM | POA: Diagnosis not present

## 2023-05-15 DIAGNOSIS — Z992 Dependence on renal dialysis: Secondary | ICD-10-CM | POA: Diagnosis not present

## 2023-05-15 DIAGNOSIS — Z23 Encounter for immunization: Secondary | ICD-10-CM | POA: Diagnosis not present

## 2023-05-16 DIAGNOSIS — R7303 Prediabetes: Secondary | ICD-10-CM | POA: Diagnosis not present

## 2023-05-16 DIAGNOSIS — Z23 Encounter for immunization: Secondary | ICD-10-CM | POA: Diagnosis not present

## 2023-05-16 DIAGNOSIS — Z1331 Encounter for screening for depression: Secondary | ICD-10-CM | POA: Diagnosis not present

## 2023-05-16 DIAGNOSIS — N186 End stage renal disease: Secondary | ICD-10-CM | POA: Diagnosis not present

## 2023-05-16 DIAGNOSIS — Z0000 Encounter for general adult medical examination without abnormal findings: Secondary | ICD-10-CM | POA: Diagnosis not present

## 2023-05-16 DIAGNOSIS — I120 Hypertensive chronic kidney disease with stage 5 chronic kidney disease or end stage renal disease: Secondary | ICD-10-CM | POA: Diagnosis not present

## 2023-05-16 DIAGNOSIS — N185 Chronic kidney disease, stage 5: Secondary | ICD-10-CM | POA: Diagnosis not present

## 2023-05-16 DIAGNOSIS — Z992 Dependence on renal dialysis: Secondary | ICD-10-CM | POA: Diagnosis not present

## 2023-05-16 DIAGNOSIS — E7800 Pure hypercholesterolemia, unspecified: Secondary | ICD-10-CM | POA: Diagnosis not present

## 2023-05-17 DIAGNOSIS — Z23 Encounter for immunization: Secondary | ICD-10-CM | POA: Diagnosis not present

## 2023-05-17 DIAGNOSIS — N186 End stage renal disease: Secondary | ICD-10-CM | POA: Diagnosis not present

## 2023-05-17 DIAGNOSIS — Z992 Dependence on renal dialysis: Secondary | ICD-10-CM | POA: Diagnosis not present

## 2023-05-18 DIAGNOSIS — N186 End stage renal disease: Secondary | ICD-10-CM | POA: Diagnosis not present

## 2023-05-18 DIAGNOSIS — Z992 Dependence on renal dialysis: Secondary | ICD-10-CM | POA: Diagnosis not present

## 2023-05-18 DIAGNOSIS — Z23 Encounter for immunization: Secondary | ICD-10-CM | POA: Diagnosis not present

## 2023-05-19 DIAGNOSIS — Z23 Encounter for immunization: Secondary | ICD-10-CM | POA: Diagnosis not present

## 2023-05-19 DIAGNOSIS — N186 End stage renal disease: Secondary | ICD-10-CM | POA: Diagnosis not present

## 2023-05-19 DIAGNOSIS — Z992 Dependence on renal dialysis: Secondary | ICD-10-CM | POA: Diagnosis not present

## 2023-05-20 DIAGNOSIS — Z23 Encounter for immunization: Secondary | ICD-10-CM | POA: Diagnosis not present

## 2023-05-20 DIAGNOSIS — N186 End stage renal disease: Secondary | ICD-10-CM | POA: Diagnosis not present

## 2023-05-20 DIAGNOSIS — Z992 Dependence on renal dialysis: Secondary | ICD-10-CM | POA: Diagnosis not present

## 2023-05-21 DIAGNOSIS — Z23 Encounter for immunization: Secondary | ICD-10-CM | POA: Diagnosis not present

## 2023-05-21 DIAGNOSIS — Z992 Dependence on renal dialysis: Secondary | ICD-10-CM | POA: Diagnosis not present

## 2023-05-21 DIAGNOSIS — N186 End stage renal disease: Secondary | ICD-10-CM | POA: Diagnosis not present

## 2023-05-22 DIAGNOSIS — N186 End stage renal disease: Secondary | ICD-10-CM | POA: Diagnosis not present

## 2023-05-22 DIAGNOSIS — Z23 Encounter for immunization: Secondary | ICD-10-CM | POA: Diagnosis not present

## 2023-05-22 DIAGNOSIS — Z992 Dependence on renal dialysis: Secondary | ICD-10-CM | POA: Diagnosis not present

## 2023-05-23 DIAGNOSIS — Z23 Encounter for immunization: Secondary | ICD-10-CM | POA: Diagnosis not present

## 2023-05-23 DIAGNOSIS — Z992 Dependence on renal dialysis: Secondary | ICD-10-CM | POA: Diagnosis not present

## 2023-05-23 DIAGNOSIS — N186 End stage renal disease: Secondary | ICD-10-CM | POA: Diagnosis not present

## 2023-05-24 DIAGNOSIS — Z992 Dependence on renal dialysis: Secondary | ICD-10-CM | POA: Diagnosis not present

## 2023-05-24 DIAGNOSIS — N186 End stage renal disease: Secondary | ICD-10-CM | POA: Diagnosis not present

## 2023-05-24 DIAGNOSIS — Z23 Encounter for immunization: Secondary | ICD-10-CM | POA: Diagnosis not present

## 2023-05-25 DIAGNOSIS — Z23 Encounter for immunization: Secondary | ICD-10-CM | POA: Diagnosis not present

## 2023-05-25 DIAGNOSIS — N186 End stage renal disease: Secondary | ICD-10-CM | POA: Diagnosis not present

## 2023-05-25 DIAGNOSIS — Z992 Dependence on renal dialysis: Secondary | ICD-10-CM | POA: Diagnosis not present

## 2023-05-26 DIAGNOSIS — Z992 Dependence on renal dialysis: Secondary | ICD-10-CM | POA: Diagnosis not present

## 2023-05-26 DIAGNOSIS — Z23 Encounter for immunization: Secondary | ICD-10-CM | POA: Diagnosis not present

## 2023-05-26 DIAGNOSIS — N186 End stage renal disease: Secondary | ICD-10-CM | POA: Diagnosis not present

## 2023-05-27 DIAGNOSIS — N186 End stage renal disease: Secondary | ICD-10-CM | POA: Diagnosis not present

## 2023-05-27 DIAGNOSIS — Z992 Dependence on renal dialysis: Secondary | ICD-10-CM | POA: Diagnosis not present

## 2023-05-27 DIAGNOSIS — Z23 Encounter for immunization: Secondary | ICD-10-CM | POA: Diagnosis not present

## 2023-05-28 DIAGNOSIS — N186 End stage renal disease: Secondary | ICD-10-CM | POA: Diagnosis not present

## 2023-05-28 DIAGNOSIS — Z992 Dependence on renal dialysis: Secondary | ICD-10-CM | POA: Diagnosis not present

## 2023-05-28 DIAGNOSIS — Z23 Encounter for immunization: Secondary | ICD-10-CM | POA: Diagnosis not present

## 2023-05-29 DIAGNOSIS — Z992 Dependence on renal dialysis: Secondary | ICD-10-CM | POA: Diagnosis not present

## 2023-05-29 DIAGNOSIS — Z23 Encounter for immunization: Secondary | ICD-10-CM | POA: Diagnosis not present

## 2023-05-29 DIAGNOSIS — N186 End stage renal disease: Secondary | ICD-10-CM | POA: Diagnosis not present

## 2023-05-30 DIAGNOSIS — Z992 Dependence on renal dialysis: Secondary | ICD-10-CM | POA: Diagnosis not present

## 2023-05-30 DIAGNOSIS — Z23 Encounter for immunization: Secondary | ICD-10-CM | POA: Diagnosis not present

## 2023-05-30 DIAGNOSIS — N186 End stage renal disease: Secondary | ICD-10-CM | POA: Diagnosis not present

## 2023-05-31 DIAGNOSIS — Z23 Encounter for immunization: Secondary | ICD-10-CM | POA: Diagnosis not present

## 2023-05-31 DIAGNOSIS — Z992 Dependence on renal dialysis: Secondary | ICD-10-CM | POA: Diagnosis not present

## 2023-05-31 DIAGNOSIS — N186 End stage renal disease: Secondary | ICD-10-CM | POA: Diagnosis not present

## 2023-06-01 DIAGNOSIS — N186 End stage renal disease: Secondary | ICD-10-CM | POA: Diagnosis not present

## 2023-06-01 DIAGNOSIS — Z23 Encounter for immunization: Secondary | ICD-10-CM | POA: Diagnosis not present

## 2023-06-01 DIAGNOSIS — Z992 Dependence on renal dialysis: Secondary | ICD-10-CM | POA: Diagnosis not present

## 2023-06-02 DIAGNOSIS — Z992 Dependence on renal dialysis: Secondary | ICD-10-CM | POA: Diagnosis not present

## 2023-06-02 DIAGNOSIS — Z23 Encounter for immunization: Secondary | ICD-10-CM | POA: Diagnosis not present

## 2023-06-02 DIAGNOSIS — N186 End stage renal disease: Secondary | ICD-10-CM | POA: Diagnosis not present

## 2023-06-03 DIAGNOSIS — Z992 Dependence on renal dialysis: Secondary | ICD-10-CM | POA: Diagnosis not present

## 2023-06-03 DIAGNOSIS — N186 End stage renal disease: Secondary | ICD-10-CM | POA: Diagnosis not present

## 2023-06-03 DIAGNOSIS — Z23 Encounter for immunization: Secondary | ICD-10-CM | POA: Diagnosis not present

## 2023-06-04 DIAGNOSIS — Z23 Encounter for immunization: Secondary | ICD-10-CM | POA: Diagnosis not present

## 2023-06-04 DIAGNOSIS — Z992 Dependence on renal dialysis: Secondary | ICD-10-CM | POA: Diagnosis not present

## 2023-06-04 DIAGNOSIS — N186 End stage renal disease: Secondary | ICD-10-CM | POA: Diagnosis not present

## 2023-06-05 DIAGNOSIS — N186 End stage renal disease: Secondary | ICD-10-CM | POA: Diagnosis not present

## 2023-06-05 DIAGNOSIS — Z23 Encounter for immunization: Secondary | ICD-10-CM | POA: Diagnosis not present

## 2023-06-05 DIAGNOSIS — Z992 Dependence on renal dialysis: Secondary | ICD-10-CM | POA: Diagnosis not present

## 2023-06-06 DIAGNOSIS — Z23 Encounter for immunization: Secondary | ICD-10-CM | POA: Diagnosis not present

## 2023-06-06 DIAGNOSIS — Z992 Dependence on renal dialysis: Secondary | ICD-10-CM | POA: Diagnosis not present

## 2023-06-06 DIAGNOSIS — N186 End stage renal disease: Secondary | ICD-10-CM | POA: Diagnosis not present

## 2023-06-07 DIAGNOSIS — Z992 Dependence on renal dialysis: Secondary | ICD-10-CM | POA: Diagnosis not present

## 2023-06-07 DIAGNOSIS — Z23 Encounter for immunization: Secondary | ICD-10-CM | POA: Diagnosis not present

## 2023-06-07 DIAGNOSIS — N186 End stage renal disease: Secondary | ICD-10-CM | POA: Diagnosis not present

## 2023-06-08 DIAGNOSIS — N186 End stage renal disease: Secondary | ICD-10-CM | POA: Diagnosis not present

## 2023-06-08 DIAGNOSIS — Z992 Dependence on renal dialysis: Secondary | ICD-10-CM | POA: Diagnosis not present

## 2023-06-08 DIAGNOSIS — Z23 Encounter for immunization: Secondary | ICD-10-CM | POA: Diagnosis not present

## 2023-06-09 DIAGNOSIS — Z23 Encounter for immunization: Secondary | ICD-10-CM | POA: Diagnosis not present

## 2023-06-09 DIAGNOSIS — N186 End stage renal disease: Secondary | ICD-10-CM | POA: Diagnosis not present

## 2023-06-09 DIAGNOSIS — Z992 Dependence on renal dialysis: Secondary | ICD-10-CM | POA: Diagnosis not present

## 2023-06-10 DIAGNOSIS — N186 End stage renal disease: Secondary | ICD-10-CM | POA: Diagnosis not present

## 2023-06-10 DIAGNOSIS — Z23 Encounter for immunization: Secondary | ICD-10-CM | POA: Diagnosis not present

## 2023-06-10 DIAGNOSIS — Z992 Dependence on renal dialysis: Secondary | ICD-10-CM | POA: Diagnosis not present

## 2023-06-11 DIAGNOSIS — N186 End stage renal disease: Secondary | ICD-10-CM | POA: Diagnosis not present

## 2023-06-11 DIAGNOSIS — Z992 Dependence on renal dialysis: Secondary | ICD-10-CM | POA: Diagnosis not present

## 2023-06-11 DIAGNOSIS — Z23 Encounter for immunization: Secondary | ICD-10-CM | POA: Diagnosis not present

## 2023-06-12 DIAGNOSIS — N186 End stage renal disease: Secondary | ICD-10-CM | POA: Diagnosis not present

## 2023-06-12 DIAGNOSIS — Z992 Dependence on renal dialysis: Secondary | ICD-10-CM | POA: Diagnosis not present

## 2023-06-12 DIAGNOSIS — Z23 Encounter for immunization: Secondary | ICD-10-CM | POA: Diagnosis not present

## 2023-06-13 DIAGNOSIS — Z992 Dependence on renal dialysis: Secondary | ICD-10-CM | POA: Diagnosis not present

## 2023-06-13 DIAGNOSIS — Z23 Encounter for immunization: Secondary | ICD-10-CM | POA: Diagnosis not present

## 2023-06-13 DIAGNOSIS — N186 End stage renal disease: Secondary | ICD-10-CM | POA: Diagnosis not present

## 2023-06-14 DIAGNOSIS — Z992 Dependence on renal dialysis: Secondary | ICD-10-CM | POA: Diagnosis not present

## 2023-06-14 DIAGNOSIS — N186 End stage renal disease: Secondary | ICD-10-CM | POA: Diagnosis not present

## 2023-06-14 DIAGNOSIS — Z23 Encounter for immunization: Secondary | ICD-10-CM | POA: Diagnosis not present

## 2023-06-15 DIAGNOSIS — Z992 Dependence on renal dialysis: Secondary | ICD-10-CM | POA: Diagnosis not present

## 2023-06-15 DIAGNOSIS — N186 End stage renal disease: Secondary | ICD-10-CM | POA: Diagnosis not present

## 2023-06-15 DIAGNOSIS — Z23 Encounter for immunization: Secondary | ICD-10-CM | POA: Diagnosis not present

## 2023-06-16 DIAGNOSIS — Z992 Dependence on renal dialysis: Secondary | ICD-10-CM | POA: Diagnosis not present

## 2023-06-16 DIAGNOSIS — N186 End stage renal disease: Secondary | ICD-10-CM | POA: Diagnosis not present

## 2023-06-16 DIAGNOSIS — Z23 Encounter for immunization: Secondary | ICD-10-CM | POA: Diagnosis not present

## 2023-06-17 DIAGNOSIS — Z23 Encounter for immunization: Secondary | ICD-10-CM | POA: Diagnosis not present

## 2023-06-17 DIAGNOSIS — Z992 Dependence on renal dialysis: Secondary | ICD-10-CM | POA: Diagnosis not present

## 2023-06-17 DIAGNOSIS — N186 End stage renal disease: Secondary | ICD-10-CM | POA: Diagnosis not present

## 2023-06-18 DIAGNOSIS — N186 End stage renal disease: Secondary | ICD-10-CM | POA: Diagnosis not present

## 2023-06-18 DIAGNOSIS — Z992 Dependence on renal dialysis: Secondary | ICD-10-CM | POA: Diagnosis not present

## 2023-06-18 DIAGNOSIS — Z23 Encounter for immunization: Secondary | ICD-10-CM | POA: Diagnosis not present

## 2023-06-19 DIAGNOSIS — Z23 Encounter for immunization: Secondary | ICD-10-CM | POA: Diagnosis not present

## 2023-06-19 DIAGNOSIS — Z992 Dependence on renal dialysis: Secondary | ICD-10-CM | POA: Diagnosis not present

## 2023-06-19 DIAGNOSIS — N186 End stage renal disease: Secondary | ICD-10-CM | POA: Diagnosis not present

## 2023-06-20 DIAGNOSIS — N186 End stage renal disease: Secondary | ICD-10-CM | POA: Diagnosis not present

## 2023-06-20 DIAGNOSIS — Z992 Dependence on renal dialysis: Secondary | ICD-10-CM | POA: Diagnosis not present

## 2023-06-20 DIAGNOSIS — Z23 Encounter for immunization: Secondary | ICD-10-CM | POA: Diagnosis not present

## 2023-06-21 DIAGNOSIS — N186 End stage renal disease: Secondary | ICD-10-CM | POA: Diagnosis not present

## 2023-06-21 DIAGNOSIS — Z992 Dependence on renal dialysis: Secondary | ICD-10-CM | POA: Diagnosis not present

## 2023-06-21 DIAGNOSIS — Z23 Encounter for immunization: Secondary | ICD-10-CM | POA: Diagnosis not present

## 2023-06-22 DIAGNOSIS — N186 End stage renal disease: Secondary | ICD-10-CM | POA: Diagnosis not present

## 2023-06-22 DIAGNOSIS — Z992 Dependence on renal dialysis: Secondary | ICD-10-CM | POA: Diagnosis not present

## 2023-06-22 DIAGNOSIS — Z23 Encounter for immunization: Secondary | ICD-10-CM | POA: Diagnosis not present

## 2023-06-23 DIAGNOSIS — Z992 Dependence on renal dialysis: Secondary | ICD-10-CM | POA: Diagnosis not present

## 2023-06-23 DIAGNOSIS — Z23 Encounter for immunization: Secondary | ICD-10-CM | POA: Diagnosis not present

## 2023-06-23 DIAGNOSIS — N186 End stage renal disease: Secondary | ICD-10-CM | POA: Diagnosis not present

## 2023-06-24 DIAGNOSIS — Z992 Dependence on renal dialysis: Secondary | ICD-10-CM | POA: Diagnosis not present

## 2023-06-24 DIAGNOSIS — Z23 Encounter for immunization: Secondary | ICD-10-CM | POA: Diagnosis not present

## 2023-06-24 DIAGNOSIS — N186 End stage renal disease: Secondary | ICD-10-CM | POA: Diagnosis not present

## 2023-06-25 DIAGNOSIS — Z23 Encounter for immunization: Secondary | ICD-10-CM | POA: Diagnosis not present

## 2023-06-25 DIAGNOSIS — N186 End stage renal disease: Secondary | ICD-10-CM | POA: Diagnosis not present

## 2023-06-25 DIAGNOSIS — Z992 Dependence on renal dialysis: Secondary | ICD-10-CM | POA: Diagnosis not present

## 2023-06-26 DIAGNOSIS — N186 End stage renal disease: Secondary | ICD-10-CM | POA: Diagnosis not present

## 2023-06-26 DIAGNOSIS — Z992 Dependence on renal dialysis: Secondary | ICD-10-CM | POA: Diagnosis not present

## 2023-06-26 DIAGNOSIS — Z23 Encounter for immunization: Secondary | ICD-10-CM | POA: Diagnosis not present

## 2023-06-27 DIAGNOSIS — Z992 Dependence on renal dialysis: Secondary | ICD-10-CM | POA: Diagnosis not present

## 2023-06-27 DIAGNOSIS — Z23 Encounter for immunization: Secondary | ICD-10-CM | POA: Diagnosis not present

## 2023-06-27 DIAGNOSIS — N186 End stage renal disease: Secondary | ICD-10-CM | POA: Diagnosis not present

## 2023-06-28 DIAGNOSIS — Z23 Encounter for immunization: Secondary | ICD-10-CM | POA: Diagnosis not present

## 2023-06-28 DIAGNOSIS — N186 End stage renal disease: Secondary | ICD-10-CM | POA: Diagnosis not present

## 2023-06-28 DIAGNOSIS — Z992 Dependence on renal dialysis: Secondary | ICD-10-CM | POA: Diagnosis not present

## 2023-06-29 DIAGNOSIS — Z23 Encounter for immunization: Secondary | ICD-10-CM | POA: Diagnosis not present

## 2023-06-29 DIAGNOSIS — N186 End stage renal disease: Secondary | ICD-10-CM | POA: Diagnosis not present

## 2023-06-29 DIAGNOSIS — Z992 Dependence on renal dialysis: Secondary | ICD-10-CM | POA: Diagnosis not present

## 2023-06-30 DIAGNOSIS — Z992 Dependence on renal dialysis: Secondary | ICD-10-CM | POA: Diagnosis not present

## 2023-06-30 DIAGNOSIS — Z23 Encounter for immunization: Secondary | ICD-10-CM | POA: Diagnosis not present

## 2023-06-30 DIAGNOSIS — N186 End stage renal disease: Secondary | ICD-10-CM | POA: Diagnosis not present

## 2023-07-01 DIAGNOSIS — Z23 Encounter for immunization: Secondary | ICD-10-CM | POA: Diagnosis not present

## 2023-07-01 DIAGNOSIS — Z992 Dependence on renal dialysis: Secondary | ICD-10-CM | POA: Diagnosis not present

## 2023-07-01 DIAGNOSIS — N186 End stage renal disease: Secondary | ICD-10-CM | POA: Diagnosis not present

## 2023-07-02 DIAGNOSIS — Z992 Dependence on renal dialysis: Secondary | ICD-10-CM | POA: Diagnosis not present

## 2023-07-02 DIAGNOSIS — Z23 Encounter for immunization: Secondary | ICD-10-CM | POA: Diagnosis not present

## 2023-07-02 DIAGNOSIS — N186 End stage renal disease: Secondary | ICD-10-CM | POA: Diagnosis not present

## 2023-07-03 DIAGNOSIS — Z992 Dependence on renal dialysis: Secondary | ICD-10-CM | POA: Diagnosis not present

## 2023-07-03 DIAGNOSIS — N186 End stage renal disease: Secondary | ICD-10-CM | POA: Diagnosis not present

## 2023-07-04 DIAGNOSIS — N186 End stage renal disease: Secondary | ICD-10-CM | POA: Diagnosis not present

## 2023-07-04 DIAGNOSIS — Z992 Dependence on renal dialysis: Secondary | ICD-10-CM | POA: Diagnosis not present

## 2023-07-05 DIAGNOSIS — N186 End stage renal disease: Secondary | ICD-10-CM | POA: Diagnosis not present

## 2023-07-05 DIAGNOSIS — Z992 Dependence on renal dialysis: Secondary | ICD-10-CM | POA: Diagnosis not present

## 2023-07-06 DIAGNOSIS — N186 End stage renal disease: Secondary | ICD-10-CM | POA: Diagnosis not present

## 2023-07-06 DIAGNOSIS — Z992 Dependence on renal dialysis: Secondary | ICD-10-CM | POA: Diagnosis not present

## 2023-07-07 DIAGNOSIS — Z992 Dependence on renal dialysis: Secondary | ICD-10-CM | POA: Diagnosis not present

## 2023-07-07 DIAGNOSIS — N186 End stage renal disease: Secondary | ICD-10-CM | POA: Diagnosis not present

## 2023-07-08 DIAGNOSIS — Z992 Dependence on renal dialysis: Secondary | ICD-10-CM | POA: Diagnosis not present

## 2023-07-08 DIAGNOSIS — N186 End stage renal disease: Secondary | ICD-10-CM | POA: Diagnosis not present

## 2023-07-09 DIAGNOSIS — N186 End stage renal disease: Secondary | ICD-10-CM | POA: Diagnosis not present

## 2023-07-09 DIAGNOSIS — Z992 Dependence on renal dialysis: Secondary | ICD-10-CM | POA: Diagnosis not present

## 2023-07-10 DIAGNOSIS — Z992 Dependence on renal dialysis: Secondary | ICD-10-CM | POA: Diagnosis not present

## 2023-07-10 DIAGNOSIS — N186 End stage renal disease: Secondary | ICD-10-CM | POA: Diagnosis not present

## 2023-07-11 DIAGNOSIS — Z992 Dependence on renal dialysis: Secondary | ICD-10-CM | POA: Diagnosis not present

## 2023-07-11 DIAGNOSIS — N186 End stage renal disease: Secondary | ICD-10-CM | POA: Diagnosis not present

## 2023-07-12 DIAGNOSIS — N186 End stage renal disease: Secondary | ICD-10-CM | POA: Diagnosis not present

## 2023-07-12 DIAGNOSIS — Z992 Dependence on renal dialysis: Secondary | ICD-10-CM | POA: Diagnosis not present

## 2023-07-13 DIAGNOSIS — N186 End stage renal disease: Secondary | ICD-10-CM | POA: Diagnosis not present

## 2023-07-13 DIAGNOSIS — Z992 Dependence on renal dialysis: Secondary | ICD-10-CM | POA: Diagnosis not present

## 2023-07-14 DIAGNOSIS — N186 End stage renal disease: Secondary | ICD-10-CM | POA: Diagnosis not present

## 2023-07-14 DIAGNOSIS — Z992 Dependence on renal dialysis: Secondary | ICD-10-CM | POA: Diagnosis not present

## 2023-07-15 DIAGNOSIS — N186 End stage renal disease: Secondary | ICD-10-CM | POA: Diagnosis not present

## 2023-07-15 DIAGNOSIS — Z992 Dependence on renal dialysis: Secondary | ICD-10-CM | POA: Diagnosis not present

## 2023-07-16 DIAGNOSIS — Z992 Dependence on renal dialysis: Secondary | ICD-10-CM | POA: Diagnosis not present

## 2023-07-16 DIAGNOSIS — N186 End stage renal disease: Secondary | ICD-10-CM | POA: Diagnosis not present

## 2023-07-17 DIAGNOSIS — N186 End stage renal disease: Secondary | ICD-10-CM | POA: Diagnosis not present

## 2023-07-17 DIAGNOSIS — Z992 Dependence on renal dialysis: Secondary | ICD-10-CM | POA: Diagnosis not present

## 2023-07-18 DIAGNOSIS — N186 End stage renal disease: Secondary | ICD-10-CM | POA: Diagnosis not present

## 2023-07-18 DIAGNOSIS — Z992 Dependence on renal dialysis: Secondary | ICD-10-CM | POA: Diagnosis not present

## 2023-07-19 DIAGNOSIS — N186 End stage renal disease: Secondary | ICD-10-CM | POA: Diagnosis not present

## 2023-07-19 DIAGNOSIS — Z992 Dependence on renal dialysis: Secondary | ICD-10-CM | POA: Diagnosis not present

## 2023-07-20 DIAGNOSIS — Z992 Dependence on renal dialysis: Secondary | ICD-10-CM | POA: Diagnosis not present

## 2023-07-20 DIAGNOSIS — N186 End stage renal disease: Secondary | ICD-10-CM | POA: Diagnosis not present

## 2023-07-21 DIAGNOSIS — Z992 Dependence on renal dialysis: Secondary | ICD-10-CM | POA: Diagnosis not present

## 2023-07-21 DIAGNOSIS — N186 End stage renal disease: Secondary | ICD-10-CM | POA: Diagnosis not present

## 2023-07-22 DIAGNOSIS — Z992 Dependence on renal dialysis: Secondary | ICD-10-CM | POA: Diagnosis not present

## 2023-07-22 DIAGNOSIS — N186 End stage renal disease: Secondary | ICD-10-CM | POA: Diagnosis not present

## 2023-07-23 DIAGNOSIS — N186 End stage renal disease: Secondary | ICD-10-CM | POA: Diagnosis not present

## 2023-07-23 DIAGNOSIS — Z992 Dependence on renal dialysis: Secondary | ICD-10-CM | POA: Diagnosis not present

## 2023-07-24 DIAGNOSIS — Z992 Dependence on renal dialysis: Secondary | ICD-10-CM | POA: Diagnosis not present

## 2023-07-24 DIAGNOSIS — N186 End stage renal disease: Secondary | ICD-10-CM | POA: Diagnosis not present

## 2023-07-25 DIAGNOSIS — Z992 Dependence on renal dialysis: Secondary | ICD-10-CM | POA: Diagnosis not present

## 2023-07-25 DIAGNOSIS — N186 End stage renal disease: Secondary | ICD-10-CM | POA: Diagnosis not present

## 2023-07-26 DIAGNOSIS — Z992 Dependence on renal dialysis: Secondary | ICD-10-CM | POA: Diagnosis not present

## 2023-07-26 DIAGNOSIS — N186 End stage renal disease: Secondary | ICD-10-CM | POA: Diagnosis not present

## 2023-07-27 DIAGNOSIS — Z992 Dependence on renal dialysis: Secondary | ICD-10-CM | POA: Diagnosis not present

## 2023-07-27 DIAGNOSIS — N186 End stage renal disease: Secondary | ICD-10-CM | POA: Diagnosis not present

## 2023-07-28 DIAGNOSIS — N186 End stage renal disease: Secondary | ICD-10-CM | POA: Diagnosis not present

## 2023-07-28 DIAGNOSIS — Z992 Dependence on renal dialysis: Secondary | ICD-10-CM | POA: Diagnosis not present

## 2023-07-29 DIAGNOSIS — Z992 Dependence on renal dialysis: Secondary | ICD-10-CM | POA: Diagnosis not present

## 2023-07-29 DIAGNOSIS — N186 End stage renal disease: Secondary | ICD-10-CM | POA: Diagnosis not present

## 2023-07-30 DIAGNOSIS — Z992 Dependence on renal dialysis: Secondary | ICD-10-CM | POA: Diagnosis not present

## 2023-07-30 DIAGNOSIS — N186 End stage renal disease: Secondary | ICD-10-CM | POA: Diagnosis not present

## 2023-07-31 DIAGNOSIS — Z992 Dependence on renal dialysis: Secondary | ICD-10-CM | POA: Diagnosis not present

## 2023-07-31 DIAGNOSIS — N186 End stage renal disease: Secondary | ICD-10-CM | POA: Diagnosis not present

## 2023-08-01 DIAGNOSIS — N186 End stage renal disease: Secondary | ICD-10-CM | POA: Diagnosis not present

## 2023-08-01 DIAGNOSIS — Z992 Dependence on renal dialysis: Secondary | ICD-10-CM | POA: Diagnosis not present

## 2023-08-02 DIAGNOSIS — Z992 Dependence on renal dialysis: Secondary | ICD-10-CM | POA: Diagnosis not present

## 2023-08-02 DIAGNOSIS — N186 End stage renal disease: Secondary | ICD-10-CM | POA: Diagnosis not present

## 2023-08-03 DIAGNOSIS — Z992 Dependence on renal dialysis: Secondary | ICD-10-CM | POA: Diagnosis not present

## 2023-08-03 DIAGNOSIS — N186 End stage renal disease: Secondary | ICD-10-CM | POA: Diagnosis not present

## 2023-08-04 DIAGNOSIS — N186 End stage renal disease: Secondary | ICD-10-CM | POA: Diagnosis not present

## 2023-08-04 DIAGNOSIS — Z992 Dependence on renal dialysis: Secondary | ICD-10-CM | POA: Diagnosis not present

## 2023-08-05 DIAGNOSIS — N186 End stage renal disease: Secondary | ICD-10-CM | POA: Diagnosis not present

## 2023-08-05 DIAGNOSIS — Z992 Dependence on renal dialysis: Secondary | ICD-10-CM | POA: Diagnosis not present

## 2023-08-06 DIAGNOSIS — Z992 Dependence on renal dialysis: Secondary | ICD-10-CM | POA: Diagnosis not present

## 2023-08-06 DIAGNOSIS — N186 End stage renal disease: Secondary | ICD-10-CM | POA: Diagnosis not present

## 2023-08-07 DIAGNOSIS — Z992 Dependence on renal dialysis: Secondary | ICD-10-CM | POA: Diagnosis not present

## 2023-08-07 DIAGNOSIS — N186 End stage renal disease: Secondary | ICD-10-CM | POA: Diagnosis not present

## 2023-08-08 DIAGNOSIS — Z992 Dependence on renal dialysis: Secondary | ICD-10-CM | POA: Diagnosis not present

## 2023-08-08 DIAGNOSIS — N186 End stage renal disease: Secondary | ICD-10-CM | POA: Diagnosis not present

## 2023-08-09 DIAGNOSIS — Z992 Dependence on renal dialysis: Secondary | ICD-10-CM | POA: Diagnosis not present

## 2023-08-09 DIAGNOSIS — N186 End stage renal disease: Secondary | ICD-10-CM | POA: Diagnosis not present

## 2023-08-10 DIAGNOSIS — Z992 Dependence on renal dialysis: Secondary | ICD-10-CM | POA: Diagnosis not present

## 2023-08-10 DIAGNOSIS — N186 End stage renal disease: Secondary | ICD-10-CM | POA: Diagnosis not present

## 2023-08-11 DIAGNOSIS — Z992 Dependence on renal dialysis: Secondary | ICD-10-CM | POA: Diagnosis not present

## 2023-08-11 DIAGNOSIS — N186 End stage renal disease: Secondary | ICD-10-CM | POA: Diagnosis not present

## 2023-08-12 DIAGNOSIS — N186 End stage renal disease: Secondary | ICD-10-CM | POA: Diagnosis not present

## 2023-08-12 DIAGNOSIS — Z992 Dependence on renal dialysis: Secondary | ICD-10-CM | POA: Diagnosis not present

## 2023-08-13 DIAGNOSIS — N186 End stage renal disease: Secondary | ICD-10-CM | POA: Diagnosis not present

## 2023-08-13 DIAGNOSIS — Z992 Dependence on renal dialysis: Secondary | ICD-10-CM | POA: Diagnosis not present

## 2023-08-14 DIAGNOSIS — Z992 Dependence on renal dialysis: Secondary | ICD-10-CM | POA: Diagnosis not present

## 2023-08-14 DIAGNOSIS — N186 End stage renal disease: Secondary | ICD-10-CM | POA: Diagnosis not present

## 2023-08-15 DIAGNOSIS — N186 End stage renal disease: Secondary | ICD-10-CM | POA: Diagnosis not present

## 2023-08-15 DIAGNOSIS — Z992 Dependence on renal dialysis: Secondary | ICD-10-CM | POA: Diagnosis not present

## 2023-08-16 DIAGNOSIS — Z992 Dependence on renal dialysis: Secondary | ICD-10-CM | POA: Diagnosis not present

## 2023-08-16 DIAGNOSIS — N186 End stage renal disease: Secondary | ICD-10-CM | POA: Diagnosis not present

## 2023-08-17 DIAGNOSIS — N186 End stage renal disease: Secondary | ICD-10-CM | POA: Diagnosis not present

## 2023-08-17 DIAGNOSIS — Z992 Dependence on renal dialysis: Secondary | ICD-10-CM | POA: Diagnosis not present

## 2023-08-18 DIAGNOSIS — N186 End stage renal disease: Secondary | ICD-10-CM | POA: Diagnosis not present

## 2023-08-18 DIAGNOSIS — Z992 Dependence on renal dialysis: Secondary | ICD-10-CM | POA: Diagnosis not present

## 2023-08-19 DIAGNOSIS — Z992 Dependence on renal dialysis: Secondary | ICD-10-CM | POA: Diagnosis not present

## 2023-08-19 DIAGNOSIS — N186 End stage renal disease: Secondary | ICD-10-CM | POA: Diagnosis not present

## 2023-08-20 DIAGNOSIS — Z992 Dependence on renal dialysis: Secondary | ICD-10-CM | POA: Diagnosis not present

## 2023-08-20 DIAGNOSIS — N186 End stage renal disease: Secondary | ICD-10-CM | POA: Diagnosis not present

## 2023-08-21 DIAGNOSIS — Z992 Dependence on renal dialysis: Secondary | ICD-10-CM | POA: Diagnosis not present

## 2023-08-21 DIAGNOSIS — N186 End stage renal disease: Secondary | ICD-10-CM | POA: Diagnosis not present

## 2023-08-22 DIAGNOSIS — N186 End stage renal disease: Secondary | ICD-10-CM | POA: Diagnosis not present

## 2023-08-22 DIAGNOSIS — Z992 Dependence on renal dialysis: Secondary | ICD-10-CM | POA: Diagnosis not present

## 2023-08-23 DIAGNOSIS — Z992 Dependence on renal dialysis: Secondary | ICD-10-CM | POA: Diagnosis not present

## 2023-08-23 DIAGNOSIS — N186 End stage renal disease: Secondary | ICD-10-CM | POA: Diagnosis not present

## 2023-08-24 DIAGNOSIS — N186 End stage renal disease: Secondary | ICD-10-CM | POA: Diagnosis not present

## 2023-08-24 DIAGNOSIS — Z5181 Encounter for therapeutic drug level monitoring: Secondary | ICD-10-CM | POA: Diagnosis not present

## 2023-08-24 DIAGNOSIS — E785 Hyperlipidemia, unspecified: Secondary | ICD-10-CM | POA: Diagnosis not present

## 2023-08-24 DIAGNOSIS — Z794 Long term (current) use of insulin: Secondary | ICD-10-CM | POA: Diagnosis not present

## 2023-08-24 DIAGNOSIS — Z79899 Other long term (current) drug therapy: Secondary | ICD-10-CM | POA: Diagnosis not present

## 2023-08-24 DIAGNOSIS — Z992 Dependence on renal dialysis: Secondary | ICD-10-CM | POA: Diagnosis not present

## 2023-08-24 DIAGNOSIS — E119 Type 2 diabetes mellitus without complications: Secondary | ICD-10-CM | POA: Diagnosis not present

## 2023-08-25 DIAGNOSIS — Z992 Dependence on renal dialysis: Secondary | ICD-10-CM | POA: Diagnosis not present

## 2023-08-25 DIAGNOSIS — N186 End stage renal disease: Secondary | ICD-10-CM | POA: Diagnosis not present

## 2023-08-26 DIAGNOSIS — N186 End stage renal disease: Secondary | ICD-10-CM | POA: Diagnosis not present

## 2023-08-26 DIAGNOSIS — Z992 Dependence on renal dialysis: Secondary | ICD-10-CM | POA: Diagnosis not present

## 2023-08-27 DIAGNOSIS — Z992 Dependence on renal dialysis: Secondary | ICD-10-CM | POA: Diagnosis not present

## 2023-08-27 DIAGNOSIS — N186 End stage renal disease: Secondary | ICD-10-CM | POA: Diagnosis not present

## 2023-08-28 DIAGNOSIS — N186 End stage renal disease: Secondary | ICD-10-CM | POA: Diagnosis not present

## 2023-08-28 DIAGNOSIS — Z992 Dependence on renal dialysis: Secondary | ICD-10-CM | POA: Diagnosis not present

## 2023-08-29 DIAGNOSIS — N186 End stage renal disease: Secondary | ICD-10-CM | POA: Diagnosis not present

## 2023-08-29 DIAGNOSIS — Z992 Dependence on renal dialysis: Secondary | ICD-10-CM | POA: Diagnosis not present

## 2023-08-30 DIAGNOSIS — N186 End stage renal disease: Secondary | ICD-10-CM | POA: Diagnosis not present

## 2023-08-30 DIAGNOSIS — Z992 Dependence on renal dialysis: Secondary | ICD-10-CM | POA: Diagnosis not present

## 2023-08-31 DIAGNOSIS — N186 End stage renal disease: Secondary | ICD-10-CM | POA: Diagnosis not present

## 2023-08-31 DIAGNOSIS — Z992 Dependence on renal dialysis: Secondary | ICD-10-CM | POA: Diagnosis not present

## 2023-09-01 DIAGNOSIS — Z992 Dependence on renal dialysis: Secondary | ICD-10-CM | POA: Diagnosis not present

## 2023-09-01 DIAGNOSIS — N186 End stage renal disease: Secondary | ICD-10-CM | POA: Diagnosis not present

## 2023-09-02 DIAGNOSIS — Z992 Dependence on renal dialysis: Secondary | ICD-10-CM | POA: Diagnosis not present

## 2023-09-02 DIAGNOSIS — Z23 Encounter for immunization: Secondary | ICD-10-CM | POA: Diagnosis not present

## 2023-09-02 DIAGNOSIS — N186 End stage renal disease: Secondary | ICD-10-CM | POA: Diagnosis not present

## 2023-09-03 DIAGNOSIS — Z23 Encounter for immunization: Secondary | ICD-10-CM | POA: Diagnosis not present

## 2023-09-03 DIAGNOSIS — Z992 Dependence on renal dialysis: Secondary | ICD-10-CM | POA: Diagnosis not present

## 2023-09-03 DIAGNOSIS — N186 End stage renal disease: Secondary | ICD-10-CM | POA: Diagnosis not present

## 2023-09-04 DIAGNOSIS — N186 End stage renal disease: Secondary | ICD-10-CM | POA: Diagnosis not present

## 2023-09-04 DIAGNOSIS — Z23 Encounter for immunization: Secondary | ICD-10-CM | POA: Diagnosis not present

## 2023-09-04 DIAGNOSIS — Z992 Dependence on renal dialysis: Secondary | ICD-10-CM | POA: Diagnosis not present

## 2023-09-05 DIAGNOSIS — Z992 Dependence on renal dialysis: Secondary | ICD-10-CM | POA: Diagnosis not present

## 2023-09-05 DIAGNOSIS — Z23 Encounter for immunization: Secondary | ICD-10-CM | POA: Diagnosis not present

## 2023-09-05 DIAGNOSIS — N186 End stage renal disease: Secondary | ICD-10-CM | POA: Diagnosis not present

## 2023-09-06 DIAGNOSIS — N186 End stage renal disease: Secondary | ICD-10-CM | POA: Diagnosis not present

## 2023-09-06 DIAGNOSIS — Z23 Encounter for immunization: Secondary | ICD-10-CM | POA: Diagnosis not present

## 2023-09-06 DIAGNOSIS — Z992 Dependence on renal dialysis: Secondary | ICD-10-CM | POA: Diagnosis not present

## 2023-09-07 DIAGNOSIS — Z992 Dependence on renal dialysis: Secondary | ICD-10-CM | POA: Diagnosis not present

## 2023-09-07 DIAGNOSIS — N186 End stage renal disease: Secondary | ICD-10-CM | POA: Diagnosis not present

## 2023-09-07 DIAGNOSIS — Z23 Encounter for immunization: Secondary | ICD-10-CM | POA: Diagnosis not present

## 2023-09-08 DIAGNOSIS — Z992 Dependence on renal dialysis: Secondary | ICD-10-CM | POA: Diagnosis not present

## 2023-09-08 DIAGNOSIS — N186 End stage renal disease: Secondary | ICD-10-CM | POA: Diagnosis not present

## 2023-09-08 DIAGNOSIS — Z23 Encounter for immunization: Secondary | ICD-10-CM | POA: Diagnosis not present

## 2023-09-09 DIAGNOSIS — Z23 Encounter for immunization: Secondary | ICD-10-CM | POA: Diagnosis not present

## 2023-09-09 DIAGNOSIS — Z992 Dependence on renal dialysis: Secondary | ICD-10-CM | POA: Diagnosis not present

## 2023-09-09 DIAGNOSIS — N186 End stage renal disease: Secondary | ICD-10-CM | POA: Diagnosis not present

## 2023-09-10 DIAGNOSIS — Z992 Dependence on renal dialysis: Secondary | ICD-10-CM | POA: Diagnosis not present

## 2023-09-10 DIAGNOSIS — N186 End stage renal disease: Secondary | ICD-10-CM | POA: Diagnosis not present

## 2023-09-10 DIAGNOSIS — Z23 Encounter for immunization: Secondary | ICD-10-CM | POA: Diagnosis not present

## 2023-09-11 DIAGNOSIS — Z992 Dependence on renal dialysis: Secondary | ICD-10-CM | POA: Diagnosis not present

## 2023-09-11 DIAGNOSIS — Z23 Encounter for immunization: Secondary | ICD-10-CM | POA: Diagnosis not present

## 2023-09-11 DIAGNOSIS — N186 End stage renal disease: Secondary | ICD-10-CM | POA: Diagnosis not present

## 2023-09-12 DIAGNOSIS — N186 End stage renal disease: Secondary | ICD-10-CM | POA: Diagnosis not present

## 2023-09-12 DIAGNOSIS — Z992 Dependence on renal dialysis: Secondary | ICD-10-CM | POA: Diagnosis not present

## 2023-09-12 DIAGNOSIS — Z23 Encounter for immunization: Secondary | ICD-10-CM | POA: Diagnosis not present

## 2023-09-13 DIAGNOSIS — Z23 Encounter for immunization: Secondary | ICD-10-CM | POA: Diagnosis not present

## 2023-09-13 DIAGNOSIS — Z992 Dependence on renal dialysis: Secondary | ICD-10-CM | POA: Diagnosis not present

## 2023-09-13 DIAGNOSIS — N186 End stage renal disease: Secondary | ICD-10-CM | POA: Diagnosis not present

## 2023-09-14 DIAGNOSIS — Z992 Dependence on renal dialysis: Secondary | ICD-10-CM | POA: Diagnosis not present

## 2023-09-14 DIAGNOSIS — Z23 Encounter for immunization: Secondary | ICD-10-CM | POA: Diagnosis not present

## 2023-09-14 DIAGNOSIS — N186 End stage renal disease: Secondary | ICD-10-CM | POA: Diagnosis not present

## 2023-09-15 DIAGNOSIS — Z23 Encounter for immunization: Secondary | ICD-10-CM | POA: Diagnosis not present

## 2023-09-15 DIAGNOSIS — Z992 Dependence on renal dialysis: Secondary | ICD-10-CM | POA: Diagnosis not present

## 2023-09-15 DIAGNOSIS — N186 End stage renal disease: Secondary | ICD-10-CM | POA: Diagnosis not present

## 2023-09-16 DIAGNOSIS — Z992 Dependence on renal dialysis: Secondary | ICD-10-CM | POA: Diagnosis not present

## 2023-09-16 DIAGNOSIS — Z23 Encounter for immunization: Secondary | ICD-10-CM | POA: Diagnosis not present

## 2023-09-16 DIAGNOSIS — N186 End stage renal disease: Secondary | ICD-10-CM | POA: Diagnosis not present

## 2023-09-17 DIAGNOSIS — Z23 Encounter for immunization: Secondary | ICD-10-CM | POA: Diagnosis not present

## 2023-09-17 DIAGNOSIS — N186 End stage renal disease: Secondary | ICD-10-CM | POA: Diagnosis not present

## 2023-09-17 DIAGNOSIS — Z992 Dependence on renal dialysis: Secondary | ICD-10-CM | POA: Diagnosis not present

## 2023-09-18 DIAGNOSIS — Z992 Dependence on renal dialysis: Secondary | ICD-10-CM | POA: Diagnosis not present

## 2023-09-18 DIAGNOSIS — Z23 Encounter for immunization: Secondary | ICD-10-CM | POA: Diagnosis not present

## 2023-09-18 DIAGNOSIS — N186 End stage renal disease: Secondary | ICD-10-CM | POA: Diagnosis not present

## 2023-09-19 DIAGNOSIS — N186 End stage renal disease: Secondary | ICD-10-CM | POA: Diagnosis not present

## 2023-09-19 DIAGNOSIS — Z23 Encounter for immunization: Secondary | ICD-10-CM | POA: Diagnosis not present

## 2023-09-19 DIAGNOSIS — Z992 Dependence on renal dialysis: Secondary | ICD-10-CM | POA: Diagnosis not present

## 2023-09-20 DIAGNOSIS — N186 End stage renal disease: Secondary | ICD-10-CM | POA: Diagnosis not present

## 2023-09-20 DIAGNOSIS — Z23 Encounter for immunization: Secondary | ICD-10-CM | POA: Diagnosis not present

## 2023-09-20 DIAGNOSIS — Z992 Dependence on renal dialysis: Secondary | ICD-10-CM | POA: Diagnosis not present

## 2023-09-21 DIAGNOSIS — Z23 Encounter for immunization: Secondary | ICD-10-CM | POA: Diagnosis not present

## 2023-09-21 DIAGNOSIS — Z992 Dependence on renal dialysis: Secondary | ICD-10-CM | POA: Diagnosis not present

## 2023-09-21 DIAGNOSIS — N186 End stage renal disease: Secondary | ICD-10-CM | POA: Diagnosis not present

## 2023-09-22 DIAGNOSIS — Z23 Encounter for immunization: Secondary | ICD-10-CM | POA: Diagnosis not present

## 2023-09-22 DIAGNOSIS — N186 End stage renal disease: Secondary | ICD-10-CM | POA: Diagnosis not present

## 2023-09-22 DIAGNOSIS — Z992 Dependence on renal dialysis: Secondary | ICD-10-CM | POA: Diagnosis not present

## 2023-09-23 DIAGNOSIS — Z23 Encounter for immunization: Secondary | ICD-10-CM | POA: Diagnosis not present

## 2023-09-23 DIAGNOSIS — N186 End stage renal disease: Secondary | ICD-10-CM | POA: Diagnosis not present

## 2023-09-23 DIAGNOSIS — Z992 Dependence on renal dialysis: Secondary | ICD-10-CM | POA: Diagnosis not present

## 2023-09-24 DIAGNOSIS — Z23 Encounter for immunization: Secondary | ICD-10-CM | POA: Diagnosis not present

## 2023-09-24 DIAGNOSIS — N186 End stage renal disease: Secondary | ICD-10-CM | POA: Diagnosis not present

## 2023-09-24 DIAGNOSIS — Z992 Dependence on renal dialysis: Secondary | ICD-10-CM | POA: Diagnosis not present

## 2023-09-25 DIAGNOSIS — Z23 Encounter for immunization: Secondary | ICD-10-CM | POA: Diagnosis not present

## 2023-09-25 DIAGNOSIS — N186 End stage renal disease: Secondary | ICD-10-CM | POA: Diagnosis not present

## 2023-09-25 DIAGNOSIS — Z992 Dependence on renal dialysis: Secondary | ICD-10-CM | POA: Diagnosis not present

## 2023-09-26 DIAGNOSIS — N186 End stage renal disease: Secondary | ICD-10-CM | POA: Diagnosis not present

## 2023-09-26 DIAGNOSIS — Z992 Dependence on renal dialysis: Secondary | ICD-10-CM | POA: Diagnosis not present

## 2023-09-26 DIAGNOSIS — Z23 Encounter for immunization: Secondary | ICD-10-CM | POA: Diagnosis not present

## 2023-09-27 ENCOUNTER — Other Ambulatory Visit: Payer: Self-pay

## 2023-09-27 ENCOUNTER — Inpatient Hospital Stay: Attending: Oncology | Admitting: Oncology

## 2023-09-27 ENCOUNTER — Inpatient Hospital Stay

## 2023-09-27 ENCOUNTER — Encounter: Payer: Self-pay | Admitting: Oncology

## 2023-09-27 VITALS — BP 133/78 | HR 85 | Temp 96.4°F | Resp 18 | Ht 59.0 in | Wt 205.6 lb

## 2023-09-27 DIAGNOSIS — D631 Anemia in chronic kidney disease: Secondary | ICD-10-CM

## 2023-09-27 DIAGNOSIS — Z79899 Other long term (current) drug therapy: Secondary | ICD-10-CM | POA: Insufficient documentation

## 2023-09-27 DIAGNOSIS — Z23 Encounter for immunization: Secondary | ICD-10-CM | POA: Diagnosis not present

## 2023-09-27 DIAGNOSIS — Z992 Dependence on renal dialysis: Secondary | ICD-10-CM | POA: Diagnosis not present

## 2023-09-27 DIAGNOSIS — I120 Hypertensive chronic kidney disease with stage 5 chronic kidney disease or end stage renal disease: Secondary | ICD-10-CM | POA: Diagnosis not present

## 2023-09-27 DIAGNOSIS — N185 Chronic kidney disease, stage 5: Secondary | ICD-10-CM

## 2023-09-27 DIAGNOSIS — N184 Chronic kidney disease, stage 4 (severe): Secondary | ICD-10-CM | POA: Diagnosis not present

## 2023-09-27 DIAGNOSIS — N186 End stage renal disease: Secondary | ICD-10-CM | POA: Diagnosis not present

## 2023-09-27 LAB — LACTATE DEHYDROGENASE: LDH: 160 U/L (ref 98–192)

## 2023-09-27 LAB — TSH: TSH: 2.427 u[IU]/mL (ref 0.350–4.500)

## 2023-09-27 LAB — COMPREHENSIVE METABOLIC PANEL WITH GFR
ALT: 14 U/L (ref 0–44)
AST: 11 U/L — ABNORMAL LOW (ref 15–41)
Albumin: 3.3 g/dL — ABNORMAL LOW (ref 3.5–5.0)
Alkaline Phosphatase: 66 U/L (ref 38–126)
Anion gap: 14 (ref 5–15)
BUN: 77 mg/dL — ABNORMAL HIGH (ref 8–23)
CO2: 24 mmol/L (ref 22–32)
Calcium: 8.8 mg/dL — ABNORMAL LOW (ref 8.9–10.3)
Chloride: 99 mmol/L (ref 98–111)
Creatinine, Ser: 8.27 mg/dL (ref 0.44–1.00)
GFR, Estimated: 4 mL/min — ABNORMAL LOW (ref >60)
Glucose, Bld: 96 mg/dL (ref 70–99)
Potassium: 3.9 mmol/L (ref 3.5–5.1)
Sodium: 137 mmol/L (ref 135–145)
Total Bilirubin: 0.6 mg/dL (ref 0.0–1.2)
Total Protein: 6.6 g/dL (ref 6.5–8.1)

## 2023-09-27 LAB — CBC WITH DIFFERENTIAL/PLATELET
Abs Immature Granulocytes: 0.05 K/uL (ref 0.00–0.07)
Basophils Absolute: 0 K/uL (ref 0.0–0.1)
Basophils Relative: 0 %
Eosinophils Absolute: 0.5 K/uL (ref 0.0–0.5)
Eosinophils Relative: 5 %
HCT: 26.2 % — ABNORMAL LOW (ref 36.0–46.0)
Hemoglobin: 8.4 g/dL — ABNORMAL LOW (ref 12.0–15.0)
Immature Granulocytes: 1 %
Lymphocytes Relative: 11 %
Lymphs Abs: 1.2 K/uL (ref 0.7–4.0)
MCH: 33.7 pg (ref 26.0–34.0)
MCHC: 32.1 g/dL (ref 30.0–36.0)
MCV: 105.2 fL — ABNORMAL HIGH (ref 80.0–100.0)
Monocytes Absolute: 0.7 K/uL (ref 0.1–1.0)
Monocytes Relative: 7 %
Neutro Abs: 8.3 K/uL — ABNORMAL HIGH (ref 1.7–7.7)
Neutrophils Relative %: 76 %
Platelets: 227 K/uL (ref 150–400)
RBC: 2.49 MIL/uL — ABNORMAL LOW (ref 3.87–5.11)
RDW: 13.7 % (ref 11.5–15.5)
WBC: 10.8 K/uL — ABNORMAL HIGH (ref 4.0–10.5)
nRBC: 0 % (ref 0.0–0.2)

## 2023-09-27 LAB — IRON AND TIBC
Iron: 75 ug/dL (ref 28–170)
Saturation Ratios: 28 % (ref 10.4–31.8)
TIBC: 267 ug/dL (ref 250–450)
UIBC: 192 ug/dL

## 2023-09-27 LAB — RETICULOCYTES
Immature Retic Fract: 14.4 % (ref 2.3–15.9)
RBC.: 2.47 MIL/uL — ABNORMAL LOW (ref 3.87–5.11)
Retic Count, Absolute: 48.2 K/uL (ref 19.0–186.0)
Retic Ct Pct: 2 % (ref 0.4–3.1)

## 2023-09-27 LAB — FOLATE: Folate: >46 ng/mL (ref >5.9)

## 2023-09-27 LAB — VITAMIN B12: Vitamin B-12: 866 pg/mL (ref 180–914)

## 2023-09-27 LAB — FERRITIN: Ferritin: 699 ng/mL — ABNORMAL HIGH (ref 11–307)

## 2023-09-27 NOTE — Progress Notes (Signed)
 Spoke to [place] in lab critical alert value: Creatinine 8.27 (read back completed)

## 2023-09-27 NOTE — Progress Notes (Signed)
 Hematology/Oncology Consult note [facility] [phone] [phone]  Patient Care Team: Lenon Layman ORN, MD as PCP - General (Internal Medicine)   Name of the patient: Laura Meyer  [id number]  Mar 28, 1940    Reason for referral-anemia of chronic kidney disease   Referring physician-Dr. 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  Ferritin level 712, iron saturation 27% in July 2025.  White count 9.6   Patient Mircera between March 2023 to August 2023.  Subsequently she was found to have kidney lesions concerning for renal cell carcinoma and Mircera was stopped.  Patient has undergone microwave ablation to her right renal lesion.  She also has small left renal lesions which are being monitored conservatively  Patient currently reports significant fatigue that is interfering with her ADLs  ECOG PS- 1  Pain scale- 0   Review of systems- Review of Systems  Constitutional:  Positive for malaise/fatigue. Negative for chills, fever and weight loss.  HENT:  Negative for congestion, ear discharge and nosebleeds.   Eyes:  Negative for blurred vision.  Respiratory:  Negative for cough, hemoptysis, sputum production, shortness of breath and wheezing.   Cardiovascular:  Negative for chest pain, palpitations, orthopnea and claudication.  Gastrointestinal:  Negative for abdominal pain, blood in stool, constipation, diarrhea, heartburn, melena, nausea and vomiting.  Genitourinary:  Negative for dysuria, flank pain, frequency, hematuria and urgency.  Musculoskeletal:  Negative for back pain, joint pain and myalgias.  Skin:  Negative for rash.  Neurological:   Negative for dizziness, tingling, focal weakness, seizures, weakness and headaches.  Endo/Heme/Allergies:  Does not bruise/bleed easily.  Psychiatric/Behavioral:  Negative for depression and suicidal ideas. The patient does not have insomnia.     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   Trigger finger of left thumb 09/22/2016   Vitamin D deficiency 03/10/2016   Prediabetes 11/04/2015   Solitary pulmonary nodule 11/11/2014   Morbid obesity (HCC) 10/08/2014   Pure hypercholesterolemia 10/08/2014   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  none in over 1 yr.     Past Surgical History:  Procedure Laterality Date   CAPD INSERTION N/A 03/12/2021   Procedure: LAPAROSCOPIC INSERTION CONTINUOUS AMBULATORY PERITONEAL DIALYSIS  (CAPD) CATHETER;  Surgeon: Desiderio Schanz, MD;  Location: [facility]S;  Service: General;  Laterality: N/A;  Provider requesting 2 hours / 120 minutes for procedure.   CATARACT EXTRACTION W/PHACO Left 06/28/2018   Procedure: CATARACT EXTRACTION PHACO AND INTRAOCULAR LENS PLACEMENT (IOC)  LEFT;   Surgeon: Mittie Gaskin, MD;  Location: [facility];  Service: Ophthalmology;  Laterality: Left;   CATARACT EXTRACTION W/PHACO Right 07/19/2018   Procedure: CATARACT EXTRACTION PHACO AND INTRAOCULAR LENS PLACEMENT (IOC) RIGHT;  Surgeon: Mittie Gaskin, MD;  Location: [facility];  Service: Ophthalmology;  Laterality: Right;   DILATION AND CURETTAGE OF UTERUS     IR RADIOLOGIST EVAL & MGMT  10/06/2021   IR RADIOLOGIST EVAL & MGMT  11/19/2021   IR RADIOLOGIST EVAL & MGMT  07/22/2022   IR RADIOLOGIST EVAL & MGMT  02/17/2023   PARATHYROIDECTOMY     RADIOLOGY WITH ANESTHESIA N/A 11/04/2021   Procedure: CT MICROWAVE ABLATION WITH ANESTHESIA;  Surgeon: Karalee Wilkie POUR, MD;  Location: [facility];  Service: Radiology;  Laterality: N/A;   REPLACEMENT TOTAL KNEE Bilateral 07/03/2003   left - 07/03/03, right - 07/26/12    Social History   Socioeconomic History   Marital status: Widowed    Spouse name: Not on file   Number of children: 2   Years of education: Not on file   Highest education level: Not on file  Occupational History   Not on file  Tobacco Use   Smoking status: Never    Passive exposure: Never   Smokeless tobacco: Never  Vaping Use   Vaping status: Never Used  Substance and Sexual Activity   Alcohol use: Not Currently   Drug use: Never   Sexual activity: Not Currently  Other Topics Concern   Not on file  Social History Narrative   Not on file   Social Drivers of Health   Financial Resource Strain: Low Risk  (05/16/2023)   Received from [facility]   Overall Financial Resource Strain (CARDIA)    Difficulty of Paying Living Expenses: Not hard at all  Food Insecurity: No Food Insecurity (05/16/2023)   Received from [facility]   Hunger Vital Sign    Within the past 12 months, you worried that your food would run out before you got the money to buy more.: Never true    Within the past 12 months, the food you bought  just didn't last and you didn't have money to get more.: Never true  Transportation Needs: No Transportation Needs (05/16/2023)   Received from [facility] - Transportation    In the past 12 months, has lack of transportation kept you from medical appointments or from getting medications?: No    Lack of Transportation (Non-Medical): No  Physical Activity: Not on file  Stress: Not on file  Social Connections: Not on file  Intimate Partner Violence: Not on file     Family History  Problem Relation Age of Onset   Cancer Mother    Heart Problems Mother    Heart attack Father    Diabetes Father    Heart attack Sister    Cancer Sister    Arthritis Sister    Alzheimer's disease Brother    Obesity Brother    Arthritis Brother    Cancer Maternal Grandmother    Heart attack Paternal Grandfather    Anuerysm Son      Current Outpatient Medications:    acetaminophen  (TYLENOL ) 500 MG tablet, Take  1,000 mg by mouth every 6 (six) hours as needed for moderate pain or mild pain., Disp: , Rfl:    allopurinol (ZYLOPRIM) 100 MG tablet, Take 100 mg by mouth in the morning., Disp: , Rfl:    aspirin EC 81 MG tablet, Take 81 mg by mouth in the morning., Disp: , Rfl:    Calcium Acetate 667 MG TABS, Take by mouth., Disp: , Rfl:    carvedilol (COREG) 12.5 MG tablet, Take 12.5 mg by mouth 2 (two) times daily with a meal., Disp: , Rfl:    Cholecalciferol (VITAMIN D3) 50 MCG (2000 UT) TABS, Take 2,000 Units by mouth in the morning., Disp: , Rfl:    cinacalcet  (SENSIPAR ) 30 MG tablet, Take 30 mg by mouth daily., Disp: , Rfl:    docusate sodium (COLACE) 100 MG capsule, Take 200 mg by mouth in the morning., Disp: , Rfl:    furosemide (LASIX) 80 MG tablet, Take 80 mg by mouth daily as needed for fluid., Disp: , Rfl:    multivitamin (RENA-VIT) TABS tablet, Take 1 tablet by mouth daily., Disp: , Rfl:    Polyethyl Glycol-Propyl Glycol (SYSTANE) 0.4-0.3 % SOLN, Place 1 drop into both  eyes in the morning., Disp: , Rfl:    simvastatin (ZOCOR) 20 MG tablet, Take 20 mg by mouth at bedtime., Disp: , Rfl:    HYDROcodone -acetaminophen  (NORCO) 5-325 MG tablet, Take 1-2 tablets by mouth every 6 (six) hours as needed for moderate pain., Disp: 10 tablet, Rfl: 0   sodium chloride  (MURO 128) 5 % ophthalmic ointment, Place 1 application into both eyes at bedtime., Disp: , Rfl:    Physical exam:  Vitals:   09/27/23 1357  BP: 133/78  Pulse: 85  Resp: 18  Temp: (!) 96.4 F (35.8 C)  TempSrc: Tympanic  SpO2: 98%  Weight: 205 lb 9.6 oz (93.3 kg)  Height: 4' 11 (1.499 m)   Physical Exam Cardiovascular:     Rate and Rhythm: Normal rate and regular rhythm.     Heart sounds: Normal heart sounds.  Pulmonary:     Effort: Pulmonary effort is normal.     Breath sounds: Normal breath sounds.  Abdominal:     General: Bowel sounds are normal.     Palpations: Abdomen is soft.  Skin:    General: Skin is warm and dry.  Neurological:     Mental Status: She is alert and oriented to person, place, and time.           Latest Ref Rng & Units 11/04/2021    7:46 AM  CMP  Glucose 70 - 99 mg/dL 863   BUN 8 - 23 mg/dL 90   Creatinine 9.55 - 1.00 mg/dL 5.42   Sodium 864 - 854 mmol/L 141   Potassium 3.5 - 5.1 mmol/L 3.4   Chloride 98 - 111 mmol/L 103   CO2 22 - 32 mmol/L 24   Calcium 8.9 - 10.3 mg/dL 9.9       Latest Ref Rng & Units 10/22/2021    1:00 PM  CBC  WBC 4.0 - 10.5 K/uL 7.6   Hemoglobin 12.0 - 15.0 g/dL 89.6   Hematocrit 63.9 - 46.0 % 31.4   Platelets 150 - 400 K/uL 202     Assessment and plan- Patient is a 83 y.o. female referred for anemia of chronic kidney disease  I will do a comprehensive anemia workup today including CBC with differential, CMP, ferritin and iron studies B12 folate myeloma panel serum free  light chains LDH haptoglobin reticulocyte count.  If there is no overt cause of anemia found on her peripheral blood work is likely secondary to chronic kidney  disease.  There is a potential risk of small renal cell carcinomas growing while on EPO based treatments but will come down to risks versus benefits especially in the light of worsening anemia and her quality of life and may be warranted to do these treatments while keeping a closer eye on these renal lesions.  Bone marrow biopsy is typically considered if anemia does not improve with EPO and even if conditions like MDS are found on bone marrow biopsy usually the first-line treatment still falls back to EPO and most of these cases.  Patient and her daughter understand the dilemma with regards to EPO and we will discuss this further at my next visit   I will see her back in 2 weeks time to discuss the results of today's labs   Thank you for this kind referral and the opportunity to participate in the care of this patient   Visit Diagnosis 1. Anemia of chronic kidney failure, stage 5 (HCC)     Dr. Annah Skene, MD, MPH [facility] [phone] 09/27/2023

## 2023-09-28 DIAGNOSIS — Z992 Dependence on renal dialysis: Secondary | ICD-10-CM | POA: Diagnosis not present

## 2023-09-28 DIAGNOSIS — N186 End stage renal disease: Secondary | ICD-10-CM | POA: Diagnosis not present

## 2023-09-28 DIAGNOSIS — Z23 Encounter for immunization: Secondary | ICD-10-CM | POA: Diagnosis not present

## 2023-09-28 LAB — KAPPA/LAMBDA LIGHT CHAINS
Kappa free light chain: 115.4 mg/L — ABNORMAL HIGH (ref 3.3–19.4)
Kappa, lambda light chain ratio: 1.26 (ref 0.26–1.65)
Lambda free light chains: 91.5 mg/L — ABNORMAL HIGH (ref 5.7–26.3)

## 2023-09-28 LAB — HAPTOGLOBIN: Haptoglobin: 359 mg/dL — ABNORMAL HIGH (ref 41–333)

## 2023-09-29 DIAGNOSIS — N186 End stage renal disease: Secondary | ICD-10-CM | POA: Diagnosis not present

## 2023-09-29 DIAGNOSIS — Z23 Encounter for immunization: Secondary | ICD-10-CM | POA: Diagnosis not present

## 2023-09-29 DIAGNOSIS — Z992 Dependence on renal dialysis: Secondary | ICD-10-CM | POA: Diagnosis not present

## 2023-09-29 LAB — MULTIPLE MYELOMA PANEL, SERUM
Albumin SerPl Elph-Mcnc: 3.1 g/dL (ref 2.9–4.4)
Albumin/Glob SerPl: 1.2 (ref 0.7–1.7)
Alpha 1: 0.3 g/dL (ref 0.0–0.4)
Alpha2 Glob SerPl Elph-Mcnc: 1 g/dL (ref 0.4–1.0)
B-Globulin SerPl Elph-Mcnc: 0.9 g/dL (ref 0.7–1.3)
Gamma Glob SerPl Elph-Mcnc: 0.5 g/dL (ref 0.4–1.8)
Globulin, Total: 2.6 g/dL (ref 2.2–3.9)
IgA: 253 mg/dL (ref 64–422)
IgG (Immunoglobin G), Serum: 581 mg/dL — ABNORMAL LOW (ref 586–1602)
IgM (Immunoglobulin M), Srm: 32 mg/dL (ref 26–217)
Total Protein ELP: 5.7 g/dL — ABNORMAL LOW (ref 6.0–8.5)

## 2023-09-30 DIAGNOSIS — Z23 Encounter for immunization: Secondary | ICD-10-CM | POA: Diagnosis not present

## 2023-09-30 DIAGNOSIS — Z992 Dependence on renal dialysis: Secondary | ICD-10-CM | POA: Diagnosis not present

## 2023-09-30 DIAGNOSIS — N186 End stage renal disease: Secondary | ICD-10-CM | POA: Diagnosis not present

## 2023-10-01 DIAGNOSIS — Z992 Dependence on renal dialysis: Secondary | ICD-10-CM | POA: Diagnosis not present

## 2023-10-01 DIAGNOSIS — Z23 Encounter for immunization: Secondary | ICD-10-CM | POA: Diagnosis not present

## 2023-10-01 DIAGNOSIS — N186 End stage renal disease: Secondary | ICD-10-CM | POA: Diagnosis not present

## 2023-10-02 DIAGNOSIS — N186 End stage renal disease: Secondary | ICD-10-CM | POA: Diagnosis not present

## 2023-10-02 DIAGNOSIS — Z23 Encounter for immunization: Secondary | ICD-10-CM | POA: Diagnosis not present

## 2023-10-02 DIAGNOSIS — Z992 Dependence on renal dialysis: Secondary | ICD-10-CM | POA: Diagnosis not present

## 2023-10-03 DIAGNOSIS — Z992 Dependence on renal dialysis: Secondary | ICD-10-CM | POA: Diagnosis not present

## 2023-10-03 DIAGNOSIS — N186 End stage renal disease: Secondary | ICD-10-CM | POA: Diagnosis not present

## 2023-10-04 DIAGNOSIS — N186 End stage renal disease: Secondary | ICD-10-CM | POA: Diagnosis not present

## 2023-10-04 DIAGNOSIS — Z992 Dependence on renal dialysis: Secondary | ICD-10-CM | POA: Diagnosis not present

## 2023-10-04 DIAGNOSIS — E878 Other disorders of electrolyte and fluid balance, not elsewhere classified: Secondary | ICD-10-CM | POA: Diagnosis not present

## 2023-10-05 DIAGNOSIS — Z992 Dependence on renal dialysis: Secondary | ICD-10-CM | POA: Diagnosis not present

## 2023-10-05 DIAGNOSIS — N186 End stage renal disease: Secondary | ICD-10-CM | POA: Diagnosis not present

## 2023-10-06 DIAGNOSIS — Z992 Dependence on renal dialysis: Secondary | ICD-10-CM | POA: Diagnosis not present

## 2023-10-06 DIAGNOSIS — N186 End stage renal disease: Secondary | ICD-10-CM | POA: Diagnosis not present

## 2023-10-07 DIAGNOSIS — Z992 Dependence on renal dialysis: Secondary | ICD-10-CM | POA: Diagnosis not present

## 2023-10-07 DIAGNOSIS — N186 End stage renal disease: Secondary | ICD-10-CM | POA: Diagnosis not present

## 2023-10-08 DIAGNOSIS — N186 End stage renal disease: Secondary | ICD-10-CM | POA: Diagnosis not present

## 2023-10-08 DIAGNOSIS — Z992 Dependence on renal dialysis: Secondary | ICD-10-CM | POA: Diagnosis not present

## 2023-10-09 DIAGNOSIS — N186 End stage renal disease: Secondary | ICD-10-CM | POA: Diagnosis not present

## 2023-10-09 DIAGNOSIS — Z992 Dependence on renal dialysis: Secondary | ICD-10-CM | POA: Diagnosis not present

## 2023-10-10 ENCOUNTER — Other Ambulatory Visit: Payer: Self-pay | Admitting: Interventional Radiology

## 2023-10-10 ENCOUNTER — Inpatient Hospital Stay: Attending: Oncology | Admitting: Oncology

## 2023-10-10 ENCOUNTER — Encounter: Payer: Self-pay | Admitting: Oncology

## 2023-10-10 DIAGNOSIS — N2889 Other specified disorders of kidney and ureter: Secondary | ICD-10-CM

## 2023-10-10 DIAGNOSIS — D631 Anemia in chronic kidney disease: Secondary | ICD-10-CM

## 2023-10-10 DIAGNOSIS — N186 End stage renal disease: Secondary | ICD-10-CM | POA: Diagnosis not present

## 2023-10-10 DIAGNOSIS — N185 Chronic kidney disease, stage 5: Secondary | ICD-10-CM

## 2023-10-10 DIAGNOSIS — Z992 Dependence on renal dialysis: Secondary | ICD-10-CM | POA: Diagnosis not present

## 2023-10-10 NOTE — Progress Notes (Signed)
 Patient has no new or acute concerns today.

## 2023-10-10 NOTE — Progress Notes (Signed)
 I connected with Laura Meyer on 10/10/23 at  1:00 PM EDT by video enabled telemedicine visit and verified that I am speaking with the correct person using two identifiers.   I discussed the limitations, risks, security and privacy concerns of performing an evaluation and management service by telemedicine and the availability of in-person appointments. I also discussed with the patient that there may be a patient responsible charge related to this service. The patient expressed understanding and agreed to proceed.  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  Ferritin level 712, iron saturation 27% in July 2025.  White count 9.6    Patient Mircera between March 2023 to August 2023.  Subsequently she was found to have kidney lesions concerning for renal cell carcinoma and Mircera was stopped.  Patient has undergone microwave ablation to her right renal lesion.  She also has small left renal lesions which are being monitored conservatively  Blood work from 09/27/2023 were as follows: CBC showed white count of 10.8, H&H of 8.4/26.2 with an MCV of 105.2 and a platelet count of 227.  Iron studies showed iron saturation of 28%.  Ferritin levels were elevated at 699.  B12 and folate was normal.  Reticulocyte count low for the degree of anemia.  Myeloma panel showed no M protein.  Haptoglobin and TSH normal.  Interval history patient continues to experience significant fatigue   Review of Systems  Constitutional:  Positive for malaise/fatigue. Negative for chills,  fever and weight loss.  HENT:  Negative for congestion, ear discharge and nosebleeds.   Eyes:  Negative for blurred vision.  Respiratory:  Negative for cough, hemoptysis, sputum production, shortness of breath and wheezing.   Cardiovascular:  Negative for chest pain, palpitations, orthopnea and claudication.  Gastrointestinal:  Negative for abdominal pain, blood in stool, constipation, diarrhea, heartburn, melena, nausea and vomiting.  Genitourinary:  Negative for dysuria, flank pain, frequency, hematuria and urgency.  Musculoskeletal:  Negative for back pain, joint pain and myalgias.  Skin:  Negative for rash.  Neurological:  Negative for dizziness, tingling, focal weakness, seizures, weakness and headaches.  Endo/Heme/Allergies:  Does not bruise/bleed easily.  Psychiatric/Behavioral:  Negative for depression and suicidal ideas. The patient does not have insomnia.     Allergies  Allergen Reactions   Contrast Media [Iodinated Contrast Media]     Avoids due to kidney issues   Iodine     Other reaction(s): Other (See Comments) Kidney disease    Past Medical History:  Diagnosis Date   Anemia    Arthritis    hands   CKD (chronic kidney disease), stage IV (HCC)    Hypertension    Pre-diabetes    Renal insufficiency    Vertigo 2019   2 episodes.  none in over 1 yr.    Past Surgical History:  Procedure Laterality Date   CAPD INSERTION N/A 03/12/2021   Procedure: LAPAROSCOPIC INSERTION CONTINUOUS AMBULATORY PERITONEAL DIALYSIS  (CAPD) CATHETER;  Surgeon: Desiderio Schanz, MD;  Location: [facility] ORS;  Service: General;  Laterality: N/A;  Provider requesting 2 hours / 120  minutes for procedure.   CATARACT EXTRACTION W/PHACO Left 06/28/2018   Procedure: CATARACT EXTRACTION PHACO AND INTRAOCULAR LENS PLACEMENT (IOC)  LEFT;  Surgeon: Mittie Gaskin, MD;  Location: [facility];  Service: Ophthalmology;  Laterality: Left;   CATARACT EXTRACTION W/PHACO Right 07/19/2018   Procedure:  CATARACT EXTRACTION PHACO AND INTRAOCULAR LENS PLACEMENT (IOC) RIGHT;  Surgeon: Mittie Gaskin, MD;  Location: [facility];  Service: Ophthalmology;  Laterality: Right;   DILATION AND CURETTAGE OF UTERUS     IR RADIOLOGIST EVAL & MGMT  10/06/2021   IR RADIOLOGIST EVAL & MGMT  11/19/2021   IR RADIOLOGIST EVAL & MGMT  07/22/2022   IR RADIOLOGIST EVAL & MGMT  02/17/2023   PARATHYROIDECTOMY     RADIOLOGY WITH ANESTHESIA N/A 11/04/2021   Procedure: CT MICROWAVE ABLATION WITH ANESTHESIA;  Surgeon: Karalee Wilkie POUR, MD;  Location: [facility];  Service: Radiology;  Laterality: N/A;   REPLACEMENT TOTAL KNEE Bilateral 07/03/2003   left - 07/03/03, right - 07/26/12    Social History   Socioeconomic History   Marital status: Widowed    Spouse name: Not on file   Number of children: 2   Years of education: Not on file   Highest education level: Not on file  Occupational History   Not on file  Tobacco Use   Smoking status: Never    Passive exposure: Never   Smokeless tobacco: Never  Vaping Use   Vaping status: Never Used  Substance and Sexual Activity   Alcohol use: Not Currently   Drug use: Never   Sexual activity: Not Currently  Other Topics Concern   Not on file  Social History Narrative   Not on file   Social Drivers of Health   Financial Resource Strain: Low Risk  (05/16/2023)   Received [facility]   Overall Financial Resource Strain (CARDIA)    Difficulty of Paying Living Expenses: Not hard at all  Food Insecurity: No Food Insecurity (09/27/2023)   Hunger Vital Sign    Worried About Running Out of Food in the Last Year: Never true    Ran Out of Food in the Last Year: Never true  Transportation Needs: No Transportation Needs (09/27/2023)   PRAPARE - Administrator, Civil Service (Medical): No    Lack of Transportation (Non-Medical): No  Physical Activity: Not on file  Stress: Not on file  Social Connections: Not on file  Intimate  Partner Violence: Not At Risk (09/27/2023)   Humiliation, Afraid, Rape, and Kick questionnaire    Fear of Current or Ex-Partner: No    Emotionally Abused: No    Physically Abused: No    Sexually Abused: No    Family History  Problem Relation Age of Onset   Cancer Mother    Heart Problems Mother    Heart attack Father    Diabetes Father    Heart attack Sister    Cancer Sister    Arthritis Sister    Alzheimer's disease Brother    Obesity Brother    Arthritis Brother    Cancer Maternal Grandmother    Heart attack Paternal Grandfather    Anuerysm Son      Current Outpatient Medications:    acetaminophen  (TYLENOL ) 500 MG tablet, Take 1,000 mg by mouth every 6 (six) hours as needed for moderate pain or mild pain., Disp: , Rfl:    allopurinol (ZYLOPRIM) 100 MG tablet, Take 100 mg by mouth in the morning., Disp: , Rfl:  aspirin EC 81 MG tablet, Take 81 mg by mouth in the morning., Disp: , Rfl:    Calcium Acetate 667 MG TABS, Take by mouth., Disp: , Rfl:    carvedilol (COREG) 12.5 MG tablet, Take 12.5 mg by mouth 2 (two) times daily with a meal., Disp: , Rfl:    Cholecalciferol (VITAMIN D3) 50 MCG (2000 UT) TABS, Take 2,000 Units by mouth in the morning., Disp: , Rfl:    cinacalcet  (SENSIPAR ) 30 MG tablet, Take 30 mg by mouth daily., Disp: , Rfl:    docusate sodium (COLACE) 100 MG capsule, Take 200 mg by mouth in the morning., Disp: , Rfl:    furosemide (LASIX) 80 MG tablet, Take 80 mg by mouth daily as needed for fluid., Disp: , Rfl:    multivitamin (RENA-VIT) TABS tablet, Take 1 tablet by mouth daily., Disp: , Rfl:    Polyethyl Glycol-Propyl Glycol (SYSTANE) 0.4-0.3 % SOLN, Place 1 drop into both eyes in the morning., Disp: , Rfl:    simvastatin (ZOCOR) 20 MG tablet, Take 20 mg by mouth at bedtime., Disp: , Rfl:    HYDROcodone -acetaminophen  (NORCO) 5-325 MG tablet, Take 1-2 tablets by mouth every 6 (six) hours as needed for moderate pain., Disp: 10 tablet, Rfl: 0   sodium chloride   (MURO 128) 5 % ophthalmic ointment, Place 1 application into both eyes at bedtime., Disp: , Rfl:   No results found.  No images are attached to the encounter.      Latest Ref Rng & Units 09/27/2023    2:43 PM  CMP  Glucose 70 - 99 mg/dL 96   BUN 8 - 23 mg/dL 77   Creatinine 9.55 - 1.00 mg/dL 1.72   Sodium 864 - 854 mmol/L 137   Potassium 3.5 - 5.1 mmol/L 3.9   Chloride 98 - 111 mmol/L 99   CO2 22 - 32 mmol/L 24   Calcium 8.9 - 10.3 mg/dL 8.8   Total Protein 6.5 - 8.1 g/dL 6.6   Total Bilirubin 0.0 - 1.2 mg/dL 0.6   Alkaline Phos 38 - 126 U/L 66   AST 15 - 41 U/L 11   ALT 0 - 44 U/L 14       Latest Ref Rng & Units 09/27/2023    2:43 PM  CBC  WBC 4.0 - 10.5 K/uL 10.8   Hemoglobin 12.0 - 15.0 g/dL 8.4   Hematocrit 63.9 - 46.0 % 26.2   Platelets 150 - 400 K/uL 227      Observation/objective: Appears in no acute distress over video visit today.  Breathing is nonlabored  Assessment and plan: Patient is 83 year old female referred for anemia of chronic kidney disease  Discussed the results of peripheral blood anemia workup including iron studies B12 folate TSH and myeloma panel which were all unremarkable.  Patient has normal white count and platelet count but is significantly anemic with a hemoglobin of 8.4.  This is probably secondary to anemia of chronic kidney disease.  She does have concern for small bilateral renal cell carcinomas which have remained stable over the years but can potentially grow with Depo.  We discussed pros and cons of starting Depo at this time and patient is agreeable to proceeding with Depo.  I have reached out to Dr. Saralee Stank who is a nephrologist and his office will be giving her Mircera at [facility].  Patient is on peritoneal dialysis.  Patient follows up with Dr. Karalee for her small bilateral renal cell carcinomas and has been  getting yearly scans.  I will reach out to him as well if he needs to increase his surveillance more frequently than once  a year.    If hemoglobin fails to improve despite EPO in the next couple of months I will plan on getting bone marrow biopsy to rule out other etiologies such as MDS.  Patient and her daughter comprehend my plan well   Follow-up instructions: CBC ferritin and iron studies in about 2-1/2 months and I will see her thereafter  I discussed the assessment and treatment plan with the patient. The patient was provided an opportunity to ask questions and all were answered. The patient agreed with the plan and demonstrated an understanding of the instructions.   The patient was advised to call back or seek an in-person evaluation if the symptoms worsen or if the condition fails to improve as anticipated.  I provided 14 minutes of face-to-face video visit time during this encounter, and > 50% was spent counseling as documented under my assessment & plan.  Visit Diagnosis: 1. Anemia of chronic kidney failure, stage 5 (HCC)     Dr. Annah Skene, MD, MPH [facility] Tel- [phone] 10/10/2023 2:28 PM

## 2023-10-11 DIAGNOSIS — Z992 Dependence on renal dialysis: Secondary | ICD-10-CM | POA: Diagnosis not present

## 2023-10-11 DIAGNOSIS — N186 End stage renal disease: Secondary | ICD-10-CM | POA: Diagnosis not present

## 2023-10-12 DIAGNOSIS — Z992 Dependence on renal dialysis: Secondary | ICD-10-CM | POA: Diagnosis not present

## 2023-10-12 DIAGNOSIS — N186 End stage renal disease: Secondary | ICD-10-CM | POA: Diagnosis not present

## 2023-10-13 DIAGNOSIS — Z992 Dependence on renal dialysis: Secondary | ICD-10-CM | POA: Diagnosis not present

## 2023-10-13 DIAGNOSIS — N186 End stage renal disease: Secondary | ICD-10-CM | POA: Diagnosis not present

## 2023-10-14 DIAGNOSIS — Z992 Dependence on renal dialysis: Secondary | ICD-10-CM | POA: Diagnosis not present

## 2023-10-14 DIAGNOSIS — N186 End stage renal disease: Secondary | ICD-10-CM | POA: Diagnosis not present

## 2023-10-15 DIAGNOSIS — N186 End stage renal disease: Secondary | ICD-10-CM | POA: Diagnosis not present

## 2023-10-15 DIAGNOSIS — Z992 Dependence on renal dialysis: Secondary | ICD-10-CM | POA: Diagnosis not present

## 2023-10-16 DIAGNOSIS — N186 End stage renal disease: Secondary | ICD-10-CM | POA: Diagnosis not present

## 2023-10-16 DIAGNOSIS — Z992 Dependence on renal dialysis: Secondary | ICD-10-CM | POA: Diagnosis not present

## 2023-10-17 DIAGNOSIS — Z992 Dependence on renal dialysis: Secondary | ICD-10-CM | POA: Diagnosis not present

## 2023-10-17 DIAGNOSIS — N186 End stage renal disease: Secondary | ICD-10-CM | POA: Diagnosis not present

## 2023-10-18 DIAGNOSIS — Z992 Dependence on renal dialysis: Secondary | ICD-10-CM | POA: Diagnosis not present

## 2023-10-18 DIAGNOSIS — N186 End stage renal disease: Secondary | ICD-10-CM | POA: Diagnosis not present

## 2023-10-19 ENCOUNTER — Telehealth: Admitting: Oncology

## 2023-10-19 DIAGNOSIS — N186 End stage renal disease: Secondary | ICD-10-CM | POA: Diagnosis not present

## 2023-10-19 DIAGNOSIS — Z992 Dependence on renal dialysis: Secondary | ICD-10-CM | POA: Diagnosis not present

## 2023-10-20 ENCOUNTER — Ambulatory Visit
Admission: RE | Admit: 2023-10-20 | Discharge: 2023-10-20 | Disposition: A | Discharge status: Home / Self Care | Source: Ambulatory Visit | Attending: Interventional Radiology | Admitting: Interventional Radiology

## 2023-10-20 DIAGNOSIS — Z992 Dependence on renal dialysis: Secondary | ICD-10-CM | POA: Diagnosis not present

## 2023-10-20 DIAGNOSIS — K8020 Calculus of gallbladder without cholecystitis without obstruction: Secondary | ICD-10-CM | POA: Diagnosis not present

## 2023-10-20 DIAGNOSIS — N2889 Other specified disorders of kidney and ureter: Secondary | ICD-10-CM

## 2023-10-20 DIAGNOSIS — N281 Cyst of kidney, acquired: Secondary | ICD-10-CM | POA: Diagnosis not present

## 2023-10-20 DIAGNOSIS — N186 End stage renal disease: Secondary | ICD-10-CM | POA: Diagnosis not present

## 2023-10-20 MED ORDER — IOPAMIDOL (ISOVUE-300) INJECTION 61%
100.0000 mL | Freq: Once | INTRAVENOUS | Status: AC | PRN
  Administered 2023-10-20: 100 mL via INTRAVENOUS

## 2023-10-21 DIAGNOSIS — Z992 Dependence on renal dialysis: Secondary | ICD-10-CM | POA: Diagnosis not present

## 2023-10-21 DIAGNOSIS — N186 End stage renal disease: Secondary | ICD-10-CM | POA: Diagnosis not present

## 2023-10-22 DIAGNOSIS — Z992 Dependence on renal dialysis: Secondary | ICD-10-CM | POA: Diagnosis not present

## 2023-10-22 DIAGNOSIS — N186 End stage renal disease: Secondary | ICD-10-CM | POA: Diagnosis not present

## 2023-10-23 DIAGNOSIS — N186 End stage renal disease: Secondary | ICD-10-CM | POA: Diagnosis not present

## 2023-10-23 DIAGNOSIS — Z992 Dependence on renal dialysis: Secondary | ICD-10-CM | POA: Diagnosis not present

## 2023-10-24 DIAGNOSIS — N186 End stage renal disease: Secondary | ICD-10-CM | POA: Diagnosis not present

## 2023-10-24 DIAGNOSIS — Z992 Dependence on renal dialysis: Secondary | ICD-10-CM | POA: Diagnosis not present

## 2023-10-25 DIAGNOSIS — Z992 Dependence on renal dialysis: Secondary | ICD-10-CM | POA: Diagnosis not present

## 2023-10-25 DIAGNOSIS — N186 End stage renal disease: Secondary | ICD-10-CM | POA: Diagnosis not present

## 2023-10-26 DIAGNOSIS — N186 End stage renal disease: Secondary | ICD-10-CM | POA: Diagnosis not present

## 2023-10-26 DIAGNOSIS — Z992 Dependence on renal dialysis: Secondary | ICD-10-CM | POA: Diagnosis not present

## 2023-10-27 DIAGNOSIS — Z992 Dependence on renal dialysis: Secondary | ICD-10-CM | POA: Diagnosis not present

## 2023-10-27 DIAGNOSIS — N186 End stage renal disease: Secondary | ICD-10-CM | POA: Diagnosis not present

## 2023-10-28 DIAGNOSIS — Z992 Dependence on renal dialysis: Secondary | ICD-10-CM | POA: Diagnosis not present

## 2023-10-28 DIAGNOSIS — N186 End stage renal disease: Secondary | ICD-10-CM | POA: Diagnosis not present

## 2023-10-29 DIAGNOSIS — Z992 Dependence on renal dialysis: Secondary | ICD-10-CM | POA: Diagnosis not present

## 2023-10-29 DIAGNOSIS — N186 End stage renal disease: Secondary | ICD-10-CM | POA: Diagnosis not present

## 2023-10-30 DIAGNOSIS — Z992 Dependence on renal dialysis: Secondary | ICD-10-CM | POA: Diagnosis not present

## 2023-10-30 DIAGNOSIS — N186 End stage renal disease: Secondary | ICD-10-CM | POA: Diagnosis not present

## 2023-10-31 DIAGNOSIS — N186 End stage renal disease: Secondary | ICD-10-CM | POA: Diagnosis not present

## 2023-10-31 DIAGNOSIS — Z992 Dependence on renal dialysis: Secondary | ICD-10-CM | POA: Diagnosis not present

## 2023-11-01 DIAGNOSIS — N186 End stage renal disease: Secondary | ICD-10-CM | POA: Diagnosis not present

## 2023-11-01 DIAGNOSIS — Z992 Dependence on renal dialysis: Secondary | ICD-10-CM | POA: Diagnosis not present

## 2023-11-02 DIAGNOSIS — Z992 Dependence on renal dialysis: Secondary | ICD-10-CM | POA: Diagnosis not present

## 2023-11-02 DIAGNOSIS — N186 End stage renal disease: Secondary | ICD-10-CM | POA: Diagnosis not present

## 2023-11-07 DIAGNOSIS — N186 End stage renal disease: Secondary | ICD-10-CM | POA: Diagnosis not present

## 2023-11-07 DIAGNOSIS — Z992 Dependence on renal dialysis: Secondary | ICD-10-CM | POA: Diagnosis not present

## 2023-11-09 DIAGNOSIS — Z992 Dependence on renal dialysis: Secondary | ICD-10-CM | POA: Diagnosis not present

## 2023-11-09 DIAGNOSIS — N186 End stage renal disease: Secondary | ICD-10-CM | POA: Diagnosis not present

## 2023-11-09 DIAGNOSIS — E878 Other disorders of electrolyte and fluid balance, not elsewhere classified: Secondary | ICD-10-CM | POA: Diagnosis not present

## 2023-11-09 DIAGNOSIS — Z794 Long term (current) use of insulin: Secondary | ICD-10-CM | POA: Diagnosis not present

## 2023-11-09 DIAGNOSIS — E119 Type 2 diabetes mellitus without complications: Secondary | ICD-10-CM | POA: Diagnosis not present

## 2023-11-10 DIAGNOSIS — N186 End stage renal disease: Secondary | ICD-10-CM | POA: Diagnosis not present

## 2023-11-10 DIAGNOSIS — Z992 Dependence on renal dialysis: Secondary | ICD-10-CM | POA: Diagnosis not present

## 2023-11-13 DIAGNOSIS — N186 End stage renal disease: Secondary | ICD-10-CM | POA: Diagnosis not present

## 2023-11-14 DIAGNOSIS — Z992 Dependence on renal dialysis: Secondary | ICD-10-CM | POA: Diagnosis not present

## 2023-11-14 DIAGNOSIS — N186 End stage renal disease: Secondary | ICD-10-CM | POA: Diagnosis not present

## 2023-11-15 DIAGNOSIS — N186 End stage renal disease: Secondary | ICD-10-CM | POA: Diagnosis not present

## 2023-11-15 DIAGNOSIS — Z992 Dependence on renal dialysis: Secondary | ICD-10-CM | POA: Diagnosis not present

## 2023-11-18 DIAGNOSIS — Z992 Dependence on renal dialysis: Secondary | ICD-10-CM | POA: Diagnosis not present

## 2023-11-18 DIAGNOSIS — N186 End stage renal disease: Secondary | ICD-10-CM | POA: Diagnosis not present

## 2023-11-20 DIAGNOSIS — N186 End stage renal disease: Secondary | ICD-10-CM | POA: Diagnosis not present

## 2023-11-21 DIAGNOSIS — Z992 Dependence on renal dialysis: Secondary | ICD-10-CM | POA: Diagnosis not present

## 2023-11-21 DIAGNOSIS — N186 End stage renal disease: Secondary | ICD-10-CM | POA: Diagnosis not present

## 2023-11-24 DIAGNOSIS — N186 End stage renal disease: Secondary | ICD-10-CM | POA: Diagnosis not present

## 2023-11-25 DIAGNOSIS — N186 End stage renal disease: Secondary | ICD-10-CM | POA: Diagnosis not present

## 2023-11-28 DIAGNOSIS — N186 End stage renal disease: Secondary | ICD-10-CM | POA: Diagnosis not present

## 2023-12-01 DIAGNOSIS — Z992 Dependence on renal dialysis: Secondary | ICD-10-CM | POA: Diagnosis not present

## 2023-12-01 DIAGNOSIS — N186 End stage renal disease: Secondary | ICD-10-CM | POA: Diagnosis not present

## 2023-12-02 DIAGNOSIS — Z992 Dependence on renal dialysis: Secondary | ICD-10-CM | POA: Diagnosis not present

## 2023-12-02 DIAGNOSIS — N186 End stage renal disease: Secondary | ICD-10-CM | POA: Diagnosis not present

## 2023-12-03 DIAGNOSIS — N186 End stage renal disease: Secondary | ICD-10-CM | POA: Diagnosis not present

## 2023-12-03 DIAGNOSIS — Z992 Dependence on renal dialysis: Secondary | ICD-10-CM | POA: Diagnosis not present

## 2023-12-04 DIAGNOSIS — Z992 Dependence on renal dialysis: Secondary | ICD-10-CM | POA: Diagnosis not present

## 2023-12-04 DIAGNOSIS — N186 End stage renal disease: Secondary | ICD-10-CM | POA: Diagnosis not present

## 2023-12-05 DIAGNOSIS — N186 End stage renal disease: Secondary | ICD-10-CM | POA: Diagnosis not present

## 2023-12-05 DIAGNOSIS — Z992 Dependence on renal dialysis: Secondary | ICD-10-CM | POA: Diagnosis not present

## 2023-12-07 DIAGNOSIS — Z992 Dependence on renal dialysis: Secondary | ICD-10-CM | POA: Diagnosis not present

## 2023-12-07 DIAGNOSIS — E878 Other disorders of electrolyte and fluid balance, not elsewhere classified: Secondary | ICD-10-CM | POA: Diagnosis not present

## 2023-12-12 DIAGNOSIS — N186 End stage renal disease: Secondary | ICD-10-CM | POA: Diagnosis not present

## 2023-12-19 ENCOUNTER — Inpatient Hospital Stay: Admitting: Oncology

## 2023-12-19 ENCOUNTER — Inpatient Hospital Stay

## 2023-12-20 DIAGNOSIS — N186 End stage renal disease: Secondary | ICD-10-CM | POA: Diagnosis not present

## 2023-12-26 DIAGNOSIS — Z992 Dependence on renal dialysis: Secondary | ICD-10-CM | POA: Diagnosis not present

## 2023-12-31 DIAGNOSIS — Z992 Dependence on renal dialysis: Secondary | ICD-10-CM | POA: Diagnosis not present

## 2023-12-31 DIAGNOSIS — N186 End stage renal disease: Secondary | ICD-10-CM | POA: Diagnosis not present

## 2024-01-01 DIAGNOSIS — Z992 Dependence on renal dialysis: Secondary | ICD-10-CM | POA: Diagnosis not present

## 2024-01-01 DIAGNOSIS — N186 End stage renal disease: Secondary | ICD-10-CM | POA: Diagnosis not present

## 2024-01-02 DIAGNOSIS — E878 Other disorders of electrolyte and fluid balance, not elsewhere classified: Secondary | ICD-10-CM | POA: Diagnosis not present

## 2024-01-02 DIAGNOSIS — Z992 Dependence on renal dialysis: Secondary | ICD-10-CM | POA: Diagnosis not present

## 2024-01-17 ENCOUNTER — Encounter: Payer: Self-pay | Admitting: Oncology

## 2024-01-17 ENCOUNTER — Inpatient Hospital Stay: Admitting: Oncology

## 2024-01-17 ENCOUNTER — Other Ambulatory Visit: Payer: Self-pay

## 2024-01-17 ENCOUNTER — Inpatient Hospital Stay: Attending: Oncology

## 2024-01-17 VITALS — BP 135/66 | HR 95 | Temp 97.5°F | Resp 18 | Ht 59.0 in | Wt 206.6 lb

## 2024-01-17 DIAGNOSIS — I120 Hypertensive chronic kidney disease with stage 5 chronic kidney disease or end stage renal disease: Secondary | ICD-10-CM | POA: Insufficient documentation

## 2024-01-17 DIAGNOSIS — D631 Anemia in chronic kidney disease: Secondary | ICD-10-CM | POA: Insufficient documentation

## 2024-01-17 DIAGNOSIS — N185 Chronic kidney disease, stage 5: Secondary | ICD-10-CM | POA: Diagnosis present

## 2024-01-17 DIAGNOSIS — Z79899 Other long term (current) drug therapy: Secondary | ICD-10-CM | POA: Diagnosis not present

## 2024-01-17 LAB — CBC WITH DIFFERENTIAL (CANCER CENTER ONLY)
Abs Immature Granulocytes: 0.04 K/uL (ref 0.00–0.07)
Basophils Absolute: 0 K/uL (ref 0.0–0.1)
Basophils Relative: 0 %
Eosinophils Absolute: 0.5 K/uL (ref 0.0–0.5)
Eosinophils Relative: 5 %
HCT: 31.3 % — ABNORMAL LOW (ref 36.0–46.0)
Hemoglobin: 10.1 g/dL — ABNORMAL LOW (ref 12.0–15.0)
Immature Granulocytes: 0 %
Lymphocytes Relative: 11 %
Lymphs Abs: 1.2 K/uL (ref 0.7–4.0)
MCH: 34.2 pg — ABNORMAL HIGH (ref 26.0–34.0)
MCHC: 32.3 g/dL (ref 30.0–36.0)
MCV: 106.1 fL — ABNORMAL HIGH (ref 80.0–100.0)
Monocytes Absolute: 0.8 K/uL (ref 0.1–1.0)
Monocytes Relative: 7 %
Neutro Abs: 8.4 K/uL — ABNORMAL HIGH (ref 1.7–7.7)
Neutrophils Relative %: 77 %
Platelet Count: 192 K/uL (ref 150–400)
RBC: 2.95 MIL/uL — ABNORMAL LOW (ref 3.87–5.11)
RDW: 15 % (ref 11.5–15.5)
WBC Count: 11 K/uL — ABNORMAL HIGH (ref 4.0–10.5)
nRBC: 0 % (ref 0.0–0.2)

## 2024-01-17 LAB — IRON AND TIBC
Iron: 49 ug/dL (ref 28–170)
Saturation Ratios: 20 % (ref 10.4–31.8)
TIBC: 249 ug/dL — ABNORMAL LOW (ref 250–450)
UIBC: 200 ug/dL

## 2024-01-17 LAB — FERRITIN: Ferritin: 812 ng/mL — ABNORMAL HIGH (ref 11–307)

## 2024-01-17 NOTE — Progress Notes (Signed)
 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  Ferritin level 712, iron saturation 27% in July 2025.  White count 9.6    Patient Mircera between March 2023 to August 2023.  Subsequently she was found to have kidney lesions concerning for renal cell carcinoma and Mircera was stopped.  Patient has undergone microwave ablation to her right renal lesion.  She also has small left renal lesions which are being monitored conservatively   Blood work from 09/27/2023 were as follows: CBC showed white count of 10.8, H&H of 8.4/26.2 with an MCV of 105.2 and a platelet count of 227.  Iron studies showed iron saturation of 28%.  Ferritin levels were elevated at 699.  B12 and folate was normal.  Reticulocyte count low for the degree of anemia.  Myeloma panel showed no M protein.  Haptoglobin and TSH normal.  Risks versus benefits of pursuing EPO in the setting of small bilateral renal masses concerning for renal cell carcinoma discussed with patient and family and she is agreeable to proceeding with Mircera which would be given by nephrology.  If anemia does not improve despite EPO injections consideration will be given for bone marrow biopsy  Interval history-overall patient reports improvement in her  energy level since starting Mircera and she has received about 4 doses so far.  She also receives IV iron through nephrology  ECOG PS- 1 Pain scale- 0   Review of systems- Review of Systems  Constitutional:  Negative for chills, fever, malaise/fatigue and weight loss.  HENT:  Negative for congestion, ear discharge and nosebleeds.   Eyes:  Negative for blurred vision.  Respiratory:  Negative for cough, hemoptysis, sputum production, shortness of breath and wheezing.   Cardiovascular:  Negative for chest pain, palpitations, orthopnea and claudication.  Gastrointestinal:  Negative for abdominal pain, blood in stool, constipation, diarrhea, heartburn, melena, nausea and vomiting.  Genitourinary:  Negative for dysuria, flank pain, frequency, hematuria and urgency.  Musculoskeletal:  Negative for back pain, joint pain and myalgias.  Skin:  Negative for rash.  Neurological:  Negative for dizziness, tingling, focal weakness, seizures, weakness and headaches.  Endo/Heme/Allergies:  Does not bruise/bleed easily.  Psychiatric/Behavioral:  Negative for depression and suicidal ideas. The patient does not have insomnia.       Allergies[1]   Past Medical History:  Diagnosis Date   Anemia    Arthritis    hands   CKD (chronic kidney disease), stage IV (HCC)    Hypertension    Pre-diabetes    Renal insufficiency    Vertigo 2019   2 episodes.  none in over 1 yr.     Past Surgical History:  Procedure Laterality Date   CAPD INSERTION N/A 03/12/2021   Procedure: LAPAROSCOPIC INSERTION CONTINUOUS AMBULATORY PERITONEAL DIALYSIS  (CAPD) CATHETER;  Surgeon: Desiderio Schanz, MD;  Location: [facility];  Service: General;  Laterality: N/A;  Provider requesting 2 hours / 120 minutes for procedure.   CATARACT EXTRACTION W/PHACO Left 06/28/2018   Procedure: CATARACT EXTRACTION PHACO AND INTRAOCULAR LENS PLACEMENT (IOC)  LEFT;  Surgeon: Mittie Gaskin, MD;  Location: [facility];  Service:  Ophthalmology;  Laterality: Left;   CATARACT EXTRACTION W/PHACO Right 07/19/2018   Procedure: CATARACT EXTRACTION PHACO AND INTRAOCULAR LENS PLACEMENT (IOC) RIGHT;  Surgeon: Mittie Gaskin, MD;  Location: [facility];  Service: Ophthalmology;  Laterality: Right;   DILATION AND CURETTAGE OF UTERUS     IR RADIOLOGIST EVAL & MGMT  10/06/2021   IR RADIOLOGIST EVAL & MGMT  11/19/2021   IR RADIOLOGIST EVAL & MGMT  07/22/2022   IR RADIOLOGIST EVAL & MGMT  02/17/2023   PARATHYROIDECTOMY     RADIOLOGY WITH ANESTHESIA N/A 11/04/2021   Procedure: CT MICROWAVE ABLATION WITH ANESTHESIA;  Surgeon: Karalee Wilkie POUR, MD;  Location: [facility];  Service: Radiology;  Laterality: N/A;   REPLACEMENT TOTAL KNEE Bilateral 07/03/2003   left - 07/03/03, right - 07/26/12    Social History   Socioeconomic History   Marital status: Widowed    Spouse name: Not on file   Number of children: 2   Years of education: Not on file   Highest education level: Not on file  Occupational History   Not on file  Tobacco Use   Smoking status: Never    Passive exposure: Never   Smokeless tobacco: Never  Vaping Use   Vaping status: Never Used  Substance and Sexual Activity   Alcohol use: Not Currently   Drug use: Never   Sexual activity: Not Currently  Other Topics Concern   Not on file  Social History Narrative   Not on file   Social Drivers of Health   Tobacco Use: Low Risk (01/17/2024)   Patient History    Smoking Tobacco Use: Never    Smokeless Tobacco Use: Never    Passive Exposure: Never  Financial Resource Strain: Low Risk  (05/16/2023)   Received from [facility]   Overall Financial Resource Strain (CARDIA)    Difficulty of Paying Living Expenses: Not hard at all  Food Insecurity: No Food Insecurity (09/27/2023)   Epic    Worried About Running Out of Food in the Last Year: Never true    Ran Out of Food in the Last Year: Never true  Transportation Needs: No Transportation Needs  (09/27/2023)   Epic    Lack of Transportation (Medical): No    Lack of Transportation (Non-Medical): No  Physical Activity: Not on file  Stress: Not on file  Social Connections: Not on file  Intimate Partner Violence: Not At Risk (09/27/2023)   Epic    Fear of Current or Ex-Partner: No    Emotionally Abused: No    Physically Abused: No    Sexually Abused: No  Depression (PHQ2-9): Low Risk (01/17/2024)   Depression (PHQ2-9)    PHQ-2 Score: 0  Alcohol Screen: Not on file  Housing: Low Risk (09/27/2023)   Epic    Unable to Pay for Housing in the Last Year: No    Number of Times Moved in the Last Year: 0    Homeless in the Last Year: No  Utilities: Not At Risk (09/27/2023)   Epic    Threatened with loss of utilities: No  Health Literacy: Not on file    Family History  Problem Relation Age of Onset   Cancer Mother  Heart Problems Mother    Heart attack Father    Diabetes Father    Heart attack Sister    Cancer Sister    Arthritis Sister    Alzheimer's disease Brother    Obesity Brother    Arthritis Brother    Cancer Maternal Grandmother    Heart attack Paternal Grandfather    Anuerysm Son     Current Medications[2]  Physical exam:  Vitals:   01/17/24 1518  BP: 135/66  Pulse: 95  Resp: 18  Temp: (!) 97.5 F (36.4 C)  TempSrc: Tympanic  SpO2: 99%  Weight: 206 lb 9.6 oz (93.7 kg)  Height: 4' 11 (1.499 m)   Physical Exam Cardiovascular:     Rate and Rhythm: Normal rate and regular rhythm.     Heart sounds: Normal heart sounds.  Pulmonary:     Effort: Pulmonary effort is normal.  Skin:    General: Skin is warm and dry.  Neurological:     Mental Status: She is alert and oriented to person, place, and time.      I have personally reviewed labs listed below:    Latest Ref Rng & Units 09/27/2023    2:43 PM  CMP  Glucose 70 - 99 mg/dL 96   BUN 8 - 23 mg/dL 77   Creatinine 9.55 - 1.00 mg/dL 1.72   Sodium 864 - 854 mmol/L 137   Potassium 3.5 - 5.1 mmol/L  3.9   Chloride 98 - 111 mmol/L 99   CO2 22 - 32 mmol/L 24   Calcium 8.9 - 10.3 mg/dL 8.8   Total Protein 6.5 - 8.1 g/dL 6.6   Total Bilirubin 0.0 - 1.2 mg/dL 0.6   Alkaline Phos 38 - 126 U/L 66   AST 15 - 41 U/L 11   ALT 0 - 44 U/L 14       Latest Ref Rng & Units 01/17/2024    3:04 PM  CBC  WBC 4.0 - 10.5 K/uL 11.0   Hemoglobin 12.0 - 15.0 g/dL 89.8   Hematocrit 63.9 - 46.0 % 31.3   Platelets 150 - 400 K/uL 192      Assessment and plan- Patient is a 83 y.o. female here for routine follow-up of anemia of chronic kidney disease   Hemoglobin is improved from 8.3-10.1 after starting Mircera.  She will continue with Mircera for the foreseeable future.  Goal is to keep her hemoglobin between 10-11 with EPO injections.  If there is decline in her hemoglobin in the future consideration will be given for bone marrow biopsy.  CBC ferritin and iron studies in 3 and 6 months and I will see her back in 6 months.  Patient follows up with interventional radiology to monitor her renal masses and will be getting closer surveillance by Dr. Karalee Assessment & Plan      Visit Diagnosis 1. Anemia of chronic kidney failure, stage 5 (HCC)      Dr. Annah Skene, MD, MPH [facility] [phone] 01/17/2024 3:49 PM                   [1]  Allergies Allergen Reactions   Contrast Media [Iodinated Contrast Media]     Avoids due to kidney issues   Iodine     Other reaction(s): Other (See Comments) Kidney disease  [2]  Current Outpatient Medications:    acetaminophen  (TYLENOL ) 500 MG tablet, Take 1,000 mg by mouth every 6 (six) hours as needed for  moderate pain or mild pain., Disp: , Rfl:    allopurinol (ZYLOPRIM) 100 MG tablet, Take 100 mg by mouth in the morning., Disp: , Rfl:    aspirin EC 81 MG tablet, Take 81 mg by mouth in the morning., Disp: , Rfl:    Calcium Acetate 667 MG TABS, Take by mouth., Disp: , Rfl:    carvedilol (COREG) 12.5 MG  tablet, Take 12.5 mg by mouth 2 (two) times daily with a meal., Disp: , Rfl:    Cholecalciferol (VITAMIN D3) 50 MCG (2000 UT) TABS, Take 2,000 Units by mouth in the morning., Disp: , Rfl:    cinacalcet  (SENSIPAR ) 30 MG tablet, Take 30 mg by mouth daily., Disp: , Rfl:    docusate sodium (COLACE) 100 MG capsule, Take 200 mg by mouth in the morning., Disp: , Rfl:    furosemide (LASIX) 80 MG tablet, Take 80 mg by mouth daily as needed for fluid., Disp: , Rfl:    multivitamin (RENA-VIT) TABS tablet, Take 1 tablet by mouth daily., Disp: , Rfl:    Polyethyl Glycol-Propyl Glycol (SYSTANE) 0.4-0.3 % SOLN, Place 1 drop into both eyes in the morning. (Patient taking differently: Place 1 drop into both eyes 2 (two) times daily.), Disp: , Rfl:    simvastatin (ZOCOR) 20 MG tablet, Take 20 mg by mouth at bedtime., Disp: , Rfl:    HYDROcodone -acetaminophen  (NORCO) 5-325 MG tablet, Take 1-2 tablets by mouth every 6 (six) hours as needed for moderate pain., Disp: 10 tablet, Rfl: 0   sodium chloride  (MURO 128) 5 % ophthalmic ointment, Place 1 application into both eyes at bedtime., Disp: , Rfl:

## 2024-04-16 ENCOUNTER — Inpatient Hospital Stay

## 2024-07-18 ENCOUNTER — Inpatient Hospital Stay: Admitting: Oncology

## 2024-07-18 ENCOUNTER — Inpatient Hospital Stay
